# Patient Record
Sex: Female | Born: 1965 | Hispanic: Yes | Marital: Married | Smoking: Former smoker
Health system: Southern US, Community
[De-identification: ages and names within clinical notes are randomized; demographics above are authoritative.]

## PROBLEM LIST (undated history)

## (undated) DIAGNOSIS — K5790 Diverticulosis of intestine, part unspecified, without perforation or abscess without bleeding: Secondary | ICD-10-CM

## (undated) DIAGNOSIS — E669 Obesity, unspecified: Secondary | ICD-10-CM

## (undated) DIAGNOSIS — E119 Type 2 diabetes mellitus without complications: Secondary | ICD-10-CM

## (undated) DIAGNOSIS — I739 Peripheral vascular disease, unspecified: Secondary | ICD-10-CM

## (undated) DIAGNOSIS — G4730 Sleep apnea, unspecified: Secondary | ICD-10-CM

## (undated) DIAGNOSIS — E785 Hyperlipidemia, unspecified: Secondary | ICD-10-CM

## (undated) DIAGNOSIS — E039 Hypothyroidism, unspecified: Secondary | ICD-10-CM

## (undated) DIAGNOSIS — I10 Essential (primary) hypertension: Secondary | ICD-10-CM

## (undated) HISTORY — DX: Hyperlipidemia, unspecified: E78.5

## (undated) HISTORY — DX: Type 2 diabetes mellitus without complications: E11.9

## (undated) HISTORY — DX: Obesity, unspecified: E66.9

## (undated) HISTORY — PX: CHOLECYSTECTOMY: SHX55

## (undated) HISTORY — DX: Essential (primary) hypertension: I10

## (undated) HISTORY — PX: ECTOPIC PREGNANCY SURGERY: SHX613

## (undated) HISTORY — DX: Sleep apnea, unspecified: G47.30

## (undated) HISTORY — DX: Hypothyroidism, unspecified: E03.9

## (undated) HISTORY — DX: Peripheral vascular disease, unspecified: I73.9

## (undated) HISTORY — DX: Diverticulosis of intestine, part unspecified, without perforation or abscess without bleeding: K57.90

---

## 1986-10-22 HISTORY — PX: OVARIAN CYST SURGERY: SHX726

## 2013-10-22 HISTORY — PX: ESOPHAGOGASTRODUODENOSCOPY: SHX1529

## 2015-02-04 ENCOUNTER — Encounter: Payer: Self-pay | Admitting: Gastroenterology

## 2015-02-04 HISTORY — PX: COLONOSCOPY: SHX174

## 2015-06-02 HISTORY — PX: CHOLECYSTECTOMY, LAPAROSCOPIC: SHX56

## 2016-11-07 ENCOUNTER — Ambulatory Visit: Payer: Self-pay | Admitting: Medical

## 2016-11-09 ENCOUNTER — Ambulatory Visit (INDEPENDENT_AMBULATORY_CARE_PROVIDER_SITE_OTHER): Payer: BLUE CROSS/BLUE SHIELD | Admitting: Medical

## 2016-11-09 ENCOUNTER — Encounter: Payer: Self-pay | Admitting: Medical

## 2016-11-09 VITALS — BP 150/89 | HR 81 | Temp 98.0°F | Resp 16 | Ht 64.0 in | Wt 217.5 lb

## 2016-11-09 DIAGNOSIS — E039 Hypothyroidism, unspecified: Secondary | ICD-10-CM | POA: Diagnosis not present

## 2016-11-09 DIAGNOSIS — E119 Type 2 diabetes mellitus without complications: Secondary | ICD-10-CM

## 2016-11-09 DIAGNOSIS — I10 Essential (primary) hypertension: Secondary | ICD-10-CM | POA: Diagnosis not present

## 2016-11-09 DIAGNOSIS — R0683 Snoring: Secondary | ICD-10-CM | POA: Diagnosis not present

## 2016-11-09 MED ORDER — LEVOTHYROXINE SODIUM 25 MCG PO TABS: 25.0000 ug | ORAL_TABLET | Freq: Every day | ORAL | 3 refills | Status: DC

## 2016-11-09 MED ORDER — LOSARTAN POTASSIUM-HCTZ 50-12.5 MG PO TABS: 1.0000 | ORAL_TABLET | Freq: Every day | ORAL | 3 refills | Status: DC

## 2016-11-09 MED ORDER — METFORMIN HCL 1000 MG PO TABS: 1000.0000 mg | ORAL_TABLET | Freq: Two times a day (BID) | ORAL | 3 refills | Status: DC

## 2016-11-09 MED FILL — LEVOTHYROXINE 25 MCG TABLET: 25 | 30 days supply | Qty: 30 | Fill #0

## 2016-11-09 MED FILL — LOSARTAN-HCTZ 50-12.5 MG TA: 50-12.5 | 30 days supply | Qty: 30 | Fill #0

## 2016-11-09 MED FILL — metFORMIN HCL 1000 MG TABS: 1000 | 90 days supply | Qty: 180 | Fill #0

## 2016-11-09 NOTE — Progress Notes (Signed)
Subjective:    Patient ID: Harvest Dark, female    DOB: January 06, 1966, 51 y.o.   MRN: [id number]  HPI  I have reviewed pt PMH, PSH, FH, Social History and Surgical History  Pt is new pt.   Pt new to [place] from [place], pt has 2 children. Married.   htn- pt states she ran out of medication 3 weeks ago. Bp mild high today but no cardiac or neurologic signs or symptoms.  Pt has diabetes- Last a1c 6.4 was 3 months.  Pt last tsh was 2.2. Pt had nodules also but negative biopsy  in the past.    Review of Systems  Constitutional: Negative for chills, fatigue and fever.  HENT: Negative for congestion, ear pain, facial swelling, nosebleeds and postnasal drip.   Respiratory: Negative for cough, chest tightness, shortness of breath and wheezing.   Cardiovascular: Negative for chest pain and palpitations.  Gastrointestinal: Negative for abdominal pain.  Genitourinary: Negative for difficulty urinating, dysuria, flank pain, frequency, hematuria, urgency and vaginal discharge.  Musculoskeletal: Negative for back pain.  Skin: Negative for rash.  Neurological: Negative for dizziness, light-headedness and numbness.  Hematological: Negative for adenopathy. Does not bruise/bleed easily.  Psychiatric/Behavioral: Negative for behavioral problems, confusion, dysphoric mood, hallucinations and suicidal ideas. The patient is not nervous/anxious and is not hyperactive.     Past Medical History:  Diagnosis Date  . Diabetes mellitus ([place])   . Hypertension   . Hypothyroidism      Social History   Social History  . Marital status: Married    Spouse name: N/A  . Number of children: N/A  . Years of education: N/A   Occupational History  . Not on file.   Social History Main Topics  . Smoking status: Former Research scientist (life sciences)  . Smokeless tobacco: Never Used     Comment: Quit >20 years ago  . Alcohol use Not on file  . Drug use: Unknown  . Sexual activity: Not on file   Other Topics Concern  . Not  on file   Social History Narrative  . No narrative on file    Past Surgical History:  Procedure Laterality Date  . CHOLECYSTECTOMY, LAPAROSCOPIC  06/02/2015   [place]  . OVARIAN CYST SURGERY  1988   Left    No family history on file.  Allergies  Allergen Reactions  . Asa [Aspirin] Swelling and Rash    No current outpatient prescriptions on file prior to visit.   No current facility-administered medications on file prior to visit.     BP (!) 156/91 (BP Location: Right Arm, Patient Position: Sitting, Cuff Size: Large) Comment: Out of Medication x3 wks.  Pulse 81   Temp 98 F (36.7 C) (Oral)   Resp 16   Ht 5\' 4"  (1.626 m)   Wt 217 lb 8 oz (98.7 kg)   SpO2 100%   BMI 37.33 kg/m       Objective:   Physical Exam  General Mental Status- Alert. General Appearance- Not in acute distress.   Skin General: Color- Normal Color. Moisture- Normal Moisture.  Neck Carotid Arteries- Normal color. Moisture- Normal Moisture. No carotid bruits. No JVD.  Chest and Lung Exam Auscultation: Breath Sounds:-Normal.  Cardiovascular Auscultation:Rythm- Regular. Murmurs & Other Heart Sounds:Auscultation of the heart reveals- No Murmurs.  Abdomen Inspection:-Inspeection Normal. Palpation/Percussion:Note:No mass. Palpation and Percussion of the abdomen reveal- Non Tender, Non Distended + BS, no rebound or guarding.    Neurologic Cranial Nerve exam:- CN III-XII intact(No nystagmus),  symmetric smile. Strength:- 5/5 equal and symmetric strength both upper and lower extremities.      Assessment & Plan:  For your low thyroid, htn and diabetes I am refilling all your meds.  Will get cmp and a1c today. Will follow lab results and may make changes to meds.  For snoring will refer to specialist   Follow up date to be determined after lab review.  Gregroy Dombkowski, Percell Miller, PA-C

## 2016-11-09 NOTE — Patient Instructions (Addendum)
For your low thyroid, htn and diabetes I am refilling all your meds.  BP mild elevated but no meds for 3 weeks.  Will get cmp and a1c today. Will follow lab results and may make changes to meds.  For snoring will refer to specialist   Follow up date to be determined after lab review.

## 2016-11-09 NOTE — Progress Notes (Signed)
Pre visit review using our clinic review tool, if applicable. No additional management support is needed unless otherwise documented below in the visit note/SLS  

## 2016-11-12 ENCOUNTER — Other Ambulatory Visit (INDEPENDENT_AMBULATORY_CARE_PROVIDER_SITE_OTHER): Payer: BLUE CROSS/BLUE SHIELD

## 2016-11-12 DIAGNOSIS — E119 Type 2 diabetes mellitus without complications: Secondary | ICD-10-CM | POA: Diagnosis not present

## 2016-11-12 DIAGNOSIS — I10 Essential (primary) hypertension: Secondary | ICD-10-CM

## 2016-11-12 LAB — COMPLETE METABOLIC PANEL WITH GFR
ALBUMIN: 4.1 g/dL (ref 3.6–5.1)
ALT: 41 U/L — ABNORMAL HIGH (ref 6–29)
AST: 23 U/L (ref 10–35)
Alkaline Phosphatase: 73 U/L (ref 33–115)
BUN: 19 mg/dL (ref 7–25)
CHLORIDE: 104 mmol/L (ref 98–110)
CO2: 25 mmol/L (ref 20–31)
Calcium: 9.5 mg/dL (ref 8.6–10.2)
Creat: 0.82 mg/dL (ref 0.50–1.10)
GFR, Est African American: >89 mL/min (ref >=60)
GFR, Est Non African American: 84 mL/min (ref >=60)
GLUCOSE: 129 mg/dL — AB (ref 65–99)
POTASSIUM: 3.9 mmol/L (ref 3.5–5.3)
SODIUM: 137 mmol/L (ref 135–146)
Total Bilirubin: 0.6 mg/dL (ref 0.2–1.2)
Total Protein: 6.9 g/dL (ref 6.1–8.1)

## 2016-11-12 LAB — HEMOGLOBIN A1C: Hgb A1c MFr Bld: 6.9 % — ABNORMAL HIGH (ref 4.6–6.5)

## 2016-11-13 NOTE — Progress Notes (Signed)
Pt was already informed

## 2016-12-04 ENCOUNTER — Ambulatory Visit (INDEPENDENT_AMBULATORY_CARE_PROVIDER_SITE_OTHER): Payer: BLUE CROSS/BLUE SHIELD | Admitting: Medical

## 2016-12-04 ENCOUNTER — Encounter: Payer: Self-pay | Admitting: Medical

## 2016-12-04 VITALS — BP 130/80 | HR 78 | Temp 98.0°F | Resp 16 | Ht 64.0 in | Wt 215.1 lb

## 2016-12-04 DIAGNOSIS — E119 Type 2 diabetes mellitus without complications: Secondary | ICD-10-CM

## 2016-12-04 DIAGNOSIS — E039 Hypothyroidism, unspecified: Secondary | ICD-10-CM

## 2016-12-04 DIAGNOSIS — I10 Essential (primary) hypertension: Secondary | ICD-10-CM | POA: Diagnosis not present

## 2016-12-04 DIAGNOSIS — L853 Xerosis cutis: Secondary | ICD-10-CM

## 2016-12-04 MED ORDER — AMMONIUM LACTATE 12 % EX LOTN: 1.0000 "application " | TOPICAL_LOTION | CUTANEOUS | 1 refills | Status: DC | PRN

## 2016-12-04 MED FILL — AMMONIUM LACTATE 12% LOTION: 12 | 30 days supply | Qty: 226 | Fill #0

## 2016-12-04 NOTE — Progress Notes (Signed)
Subjective:    Patient ID: Harvest Dark, female    DOB: 1966/06/29, 51 y.o.   MRN: [id number]  HPI  Pt in for bp check. Pt initially high in office. Pt has been checking at her house about one time a day. Her bp is about 130/80. Pt is on hyzaar. No cardiac or neurologic signs or symptoms. When she first cam in her bp was higher but not on medications.  Pt a1-c was less than 7 on recent a1c. She is on metformin. She plans to walk more when weather warms.  Pt does have history of thyroid nodules. Had [place] in October. Study was negative. Told to repeat every. Pt tsh was normal. Pt has been on low dose synthroid for a very low dose for 2-3 yrs.    Review of Systems  Constitutional: Negative for chills, fatigue and fever.  Respiratory: Negative for chest tightness, shortness of breath and wheezing.   Cardiovascular: Negative for chest pain and palpitations.  Gastrointestinal: Negative for abdominal pain, diarrhea, nausea and vomiting.  Musculoskeletal: Negative for back pain and neck pain.  Skin: Negative for rash.  Neurological: Negative for dizziness, syncope, weakness, numbness and headaches.  Hematological: Negative for adenopathy. Does not bruise/bleed easily.  Psychiatric/Behavioral: Negative for agitation, confusion, decreased concentration, dysphoric mood and suicidal ideas. The patient is not nervous/anxious.     Past Medical History:  Diagnosis Date  . Diabetes mellitus ([place])   . Hypertension   . Hypothyroidism      Social History   Social History  . Marital status: Married    Spouse name: N/A  . Number of children: N/A  . Years of education: N/A   Occupational History  . Not on file.   Social History Main Topics  . Smoking status: Former Smoker    Quit date: 11/09/1989  . Smokeless tobacco: Never Used     Comment: Quit >20 years ago  . Alcohol use No  . Drug use: No  . Sexual activity: Yes   Other Topics Concern  . Not on file   Social History  Narrative  . No narrative on file    Past Surgical History:  Procedure Laterality Date  . CHOLECYSTECTOMY, LAPAROSCOPIC  06/02/2015   [place]  . OVARIAN CYST SURGERY  1988   Left    No family history on file.  Allergies  Allergen Reactions  . Asa [Aspirin] Swelling and Rash    Current Outpatient Prescriptions on File Prior to Visit  Medication Sig Dispense Refill  . levothyroxine (SYNTHROID, LEVOTHROID) 25 MCG tablet Take 1 tablet (25 mcg total) by mouth daily before breakfast. 30 tablet 3  . losartan-hydrochlorothiazide (HYZAAR) 50-12.5 MG tablet Take 1 tablet by mouth daily. 30 tablet 3  . metFORMIN (GLUCOPHAGE) 1000 MG tablet Take 1 tablet (1,000 mg total) by mouth 2 (two) times daily with a meal. 180 tablet 3   No current facility-administered medications on file prior to visit.     BP (!) 140/95 (BP Location: Left Arm, Cuff Size: Large)   Pulse 78   Temp 98 F (36.7 C) (Oral)   Resp 16   Ht 5\' 4"  (1.626 m)   Wt 215 lb 2 oz (97.6 kg)   SpO2 99%   BMI 36.93 kg/m       Objective:   Physical Exam  General Mental Status- Alert. General Appearance- Not in acute distress.   Skin General: Color- Normal Color. Moisture- Normal Moisture.  Neck Carotid Arteries- Normal color.  Moisture- Normal Moisture. No carotid bruits. No JVD.  Chest and Lung Exam Auscultation: Breath Sounds:-Normal.  Cardiovascular Auscultation:Rythm- Regular. Murmurs & Other Heart Sounds:Auscultation of the heart reveals- No Murmurs.  Abdomen Inspection:-Inspeection Normal. Palpation/Percussion:Note:No mass. Palpation and Percussion of the abdomen reveal- Non Tender, Non Distended + BS, no rebound or guarding.   Neurologic Cranial Nerve exam:- CN III-XII intact(No nystagmus), symmetric smile. Strength:- 5/5 equal and symmetric strength both upper and lower extremities.  Skin- lower calf dry skin both sides.    Assessment & Plan:  For your high blood pressure well controlled  today continue Hyzaar.  For diabetes adequately controlled  continue metformin. Exercise more.  For low thyoid will repeat tsh in 3 months.   For dry skin rx lac-hydrin.   Follow up in 3 months early am fasting.  Joshuah Minella, Percell Miller, PA-C

## 2016-12-04 NOTE — Patient Instructions (Addendum)
For your high blood pressure well controlled today continue Hyzaar.  For diabetes adequately controlled  continue metformin. Exercise more.  For low thyoid will repeat tsh in 3 months.   For dry skin rx lac-hydrin.   Follow up in 3 months early am fasting.

## 2016-12-04 NOTE — Progress Notes (Signed)
Pre visit review using our clinic review tool, if applicable. No additional management support is needed unless otherwise documented below in the visit note/SLS  

## 2016-12-10 MED FILL — LOSARTAN-HCTZ 50-12.5 MG TA: 50-12.5 | 30 days supply | Qty: 30 | Fill #1

## 2016-12-10 MED FILL — LEVOTHYROXINE 25 MCG TABLET: 25 | 30 days supply | Qty: 30 | Fill #1

## 2017-01-02 ENCOUNTER — Ambulatory Visit (INDEPENDENT_AMBULATORY_CARE_PROVIDER_SITE_OTHER): Payer: BLUE CROSS/BLUE SHIELD | Admitting: Pulmonary Disease

## 2017-01-02 ENCOUNTER — Encounter: Payer: Self-pay | Admitting: Pulmonary Disease

## 2017-01-02 VITALS — BP 126/98 | HR 86 | Ht 64.0 in | Wt 217.8 lb

## 2017-01-02 DIAGNOSIS — G4710 Hypersomnia, unspecified: Secondary | ICD-10-CM

## 2017-01-02 NOTE — Patient Instructions (Signed)
It was a pleasure taking care of you today!  We will schedule you to have a sleep study to determine if you have sleep apnea.     We will get a lab sleep study.  You will be scheduled to have a lab sleep study in 4-6 weeks.  Someone from the sleep lab will call you in 2-3 days to schedule the study with you.  They usually have cancellations every night so most likely, they will have openings for a lab sleep study next week or so.  We encourage you to do your sleep study then if possible. Please give us a call in a week is no one from the sleep lab calls you in 2-3 days.   If the sleep study is positive, we will order you a CPAP  machine.  Please call the office if you do NOT receive your machine in the next 1-2 weeks.   Please make sure you use your CPAP device everytime you sleep.  We will monitor the usage of your machine per your insurance requirement.  Your insurance company may take the machine from you if you are not using it regularly.   Please clean the mask, tubings, filter, water reservoir with soapy water every week.  Please use distilled water for the water reservoir.   Please call the office or your machine provider (DME company) if you are having issues with the device.   Return to clinic in 8-10 weeks with Dr. De Dios or NP    

## 2017-01-02 NOTE — Assessment & Plan Note (Signed)
  Patient has snoring, witnessed apneas, gasping, choking, frequent awakenings.  She sleeps 6 hrs/night.  Naps daily. No abnormal behavior in sleep.   Her husband has OSA and uses cpap.  ESS 17.   Plan : We discussed about the diagnosis of Obstructive Sleep Apnea (OSA) and implications of untreated OSA. We discussed about CPAP and BiPaP as possible treatment options.    We will schedule the patient for a sleep study. Plan for a split-night sleep study. She is from [place]. Her husband has sleep apnea. Most likely has moderate. Anticipate no issues with CPAP. Likely will need a full face mask.  Patient was instructed to call the office if he/she has not heard back from the office 1-2 weeks after the sleep study.   Patient was instructed to call the office if he/she is having issues with the PAP device.   We discussed good sleep hygiene.   Patient was advised not to engage in activities requiring concentration and/or vigilance if he/she is sleepy.  Patient was advised not to drive if he/she is sleepy.

## 2017-01-02 NOTE — Progress Notes (Signed)
Subjective:    Patient ID: Deborah Henderson, female    DOB: 04-15-1966, 51 y.o.   MRN: [id number]  HPI  This is the case of Deborah Henderson, 51 y.o. Female, who was referred by Hattie Perch in consultation regarding possible OSA   As you very well know, patient is a non smoker, not known to have asthma or copd, is from [place] but has been in [place] since 08/2016.   Patient has snoring, witnessed apneas, gasping, choking, frequent awakenings.  She sleeps 6 hrs/night.  Naps daily. No abnormal behavior in sleep.   Her husband has OSA and uses cpap.  ESS 17.      Review of Systems  Constitutional: Negative.  Negative for fever and unexpected weight change.  HENT: Positive for congestion. Negative for dental problem, ear pain, nosebleeds, postnasal drip, rhinorrhea, sinus pressure, sneezing, sore throat and trouble swallowing.   Eyes: Negative.  Negative for redness and itching.  Respiratory: Positive for cough. Negative for chest tightness, shortness of breath and wheezing.   Cardiovascular: Positive for leg swelling. Negative for palpitations.  Gastrointestinal: Negative.  Negative for nausea and vomiting.  Endocrine: Negative.   Genitourinary: Negative.  Negative for dysuria.  Musculoskeletal: Negative.  Negative for joint swelling.  Skin: Negative.  Negative for rash.  Allergic/Immunologic: Negative.  Negative for environmental allergies, food allergies and immunocompromised state.  Neurological: Positive for dizziness and headaches.  Hematological: Negative.  Does not bruise/bleed easily.  Psychiatric/Behavioral: Negative.  Negative for dysphoric mood. The patient is not nervous/anxious.    Past Medical History:  Diagnosis Date  . Diabetes mellitus ([place])   . Hypertension   . Hypothyroidism    (-)CA, DVT  No family history on file.  Father had colon CA.  Mother had no significant issues.   Past Surgical History:  Procedure Laterality Date  .  CHOLECYSTECTOMY, LAPAROSCOPIC  06/02/2015   [place]  . OVARIAN CYST SURGERY  1988   Left    Social History   Social History  . Marital status: Married    Spouse name: N/A  . Number of children: N/A  . Years of education: N/A   Occupational History  . Not on file.   Social History Main Topics  . Smoking status: Former Smoker    Quit date: 11/09/1989  . Smokeless tobacco: Never Used     Comment: Quit >20 years ago  . Alcohol use No  . Drug use: No  . Sexual activity: Yes   Other Topics Concern  . Not on file   Social History Narrative  . No narrative on file    Married with 2 children. Worked in [employer].   Allergies  Allergen Reactions  . Asa [Aspirin] Swelling and Rash     Outpatient Medications Prior to Visit  Medication Sig Dispense Refill  . ammonium lactate (AMLACTIN) 12 % lotion Apply 1 application topically as needed for dry skin. Apply to area twice daily 225 g 1  . levothyroxine (SYNTHROID, LEVOTHROID) 25 MCG tablet Take 1 tablet (25 mcg total) by mouth daily before breakfast. 30 tablet 3  . losartan-hydrochlorothiazide (HYZAAR) 50-12.5 MG tablet Take 1 tablet by mouth daily. 30 tablet 3  . metFORMIN (GLUCOPHAGE) 1000 MG tablet Take 1 tablet (1,000 mg total) by mouth 2 (two) times daily with a meal. 180 tablet 3   No facility-administered medications prior to visit.    No orders of the defined types were placed in this encounter.  Objective:   Physical Exam  Vitals:  Vitals:   01/02/17 0859  BP: (!) 126/98  Pulse: 86  SpO2: 97%  Weight: 217 lb 12.8 oz (98.8 kg)  Height: 5\' 4"  (1.626 m)    Constitutional/General:  Pleasant, well-nourished, well-developed, not in any distress,  Comfortably seating.  Well kempt  Body mass index is 37.39 kg/m. Wt Readings from Last 3 Encounters:  01/02/17 217 lb 12.8 oz (98.8 kg)  12/04/16 215 lb 2 oz (97.6 kg)  11/09/16 217 lb 8 oz (98.7 kg)     HEENT: Pupils equal and reactive to light and  accommodation. Anicteric sclerae. Normal nasal mucosa.   No oral  lesions,  mouth clear,  oropharynx clear, no postnasal drip. (-) Oral thrush. No dental caries.  Airway - Mallampati class III  Neck: No masses. Midline trachea. No JVD, (-) LAD. (-) bruits appreciated.  Respiratory/Chest: Grossly normal chest. (-) deformity. (-) Accessory muscle use.  Symmetric expansion. (-) Tenderness on palpation.  Resonant on percussion.  Diminished BS on both lower lung zones. (-) wheezing, crackles, rhonchi (-) egophony  Cardiovascular: Regular rate and  rhythm, heart sounds normal, no murmur or gallops, no peripheral edema  Gastrointestinal:  Normal bowel sounds. Soft, non-tender. No hepatosplenomegaly.  (-) masses.   Musculoskeletal:  Normal muscle tone. Normal gait.   Extremities: Grossly normal. (-) clubbing, cyanosis.  (-) edema  Skin: (-) rash,lesions seen.   Neurological/Psychiatric : alert, oriented to time, place, person. Normal mood and affect         Assessment & Plan:  Hypersomnia  Patient has snoring, witnessed apneas, gasping, choking, frequent awakenings.  She sleeps 6 hrs/night.  Naps daily. No abnormal behavior in sleep.   Her husband has OSA and uses cpap.  ESS 17.   Plan : We discussed about the diagnosis of Obstructive Sleep Apnea (OSA) and implications of untreated OSA. We discussed about CPAP and BiPaP as possible treatment options.    We will schedule the patient for a sleep study. Plan for a split-night sleep study. She is from [place]. Her husband has sleep apnea. Most likely has moderate. Anticipate no issues with CPAP. Likely will need a full face mask.  Patient was instructed to call the office if he/she has not heard back from the office 1-2 weeks after the sleep study.   Patient was instructed to call the office if he/she is having issues with the PAP device.   We discussed good sleep hygiene.   Patient was advised not to engage in activities  requiring concentration and/or vigilance if he/she is sleepy.  Patient was advised not to drive if he/she is sleepy.      Thank you very much for letting me participate in this patient's care. Please do not hesitate to give me a call if you have any questions or concerns regarding the treatment plan.   Patient will follow up with me in 8-10 weeks.    Monica Becton, MD 01/02/2017   9:25 AM [facility] Pager: [phone] Office: [phone], Fax: [phone]

## 2017-01-09 MED FILL — LOSARTAN-HCTZ 50-12.5 MG TA: 50-12.5 | 30 days supply | Qty: 30 | Fill #2

## 2017-01-09 MED FILL — LEVOTHYROXINE 25 MCG TABLET: 25 | 30 days supply | Qty: 30 | Fill #2

## 2017-02-08 ENCOUNTER — Telehealth: Payer: Self-pay | Admitting: Pulmonary Disease

## 2017-02-08 ENCOUNTER — Other Ambulatory Visit: Payer: Self-pay

## 2017-02-08 DIAGNOSIS — G4710 Hypersomnia, unspecified: Secondary | ICD-10-CM

## 2017-02-08 MED FILL — LEVOTHYROXINE 25 MCG TABLET: 25 | 30 days supply | Qty: 30 | Fill #3

## 2017-02-08 MED FILL — LOSARTAN-HCTZ 50-12.5 MG TA: 50-12.5 | 30 days supply | Qty: 30 | Fill #3

## 2017-02-08 MED FILL — metFORMIN HCL 1000 MG TABS: 1000 | 90 days supply | Qty: 180 | Fill #1

## 2017-02-08 NOTE — Telephone Encounter (Signed)
I will check with [facility] and get a precert Joellen [place]

## 2017-02-08 NOTE — Telephone Encounter (Signed)
   Libby/Sherry :   I ordered a split night sleep study on this pt which was denied.   I have NOT done a peer to peer yet.  I was wondering if we can do a HST first.  Can we order a HST rather than a split night sleep study? Will that be approved?    Thanks!  J. Shirl Harris, MD 02/08/2017, 1:10 PM [facility] Pager [phone] After 3 pm or if no answer, call [phone]

## 2017-02-15 NOTE — Telephone Encounter (Signed)
[facility] has precerted hst & I have pt scheduled to pick up machine on 5/9.  I have spoken to pt & her husband.  Nothing further needed.

## 2017-02-17 ENCOUNTER — Encounter (HOSPITAL_BASED_OUTPATIENT_CLINIC_OR_DEPARTMENT_OTHER): Payer: BLUE CROSS/BLUE SHIELD

## 2017-02-27 ENCOUNTER — Telehealth: Payer: Self-pay | Admitting: Pulmonary Disease

## 2017-02-27 DIAGNOSIS — R1900 Intra-abdominal and pelvic swelling, mass and lump, unspecified site: Secondary | ICD-10-CM

## 2017-02-27 DIAGNOSIS — G4733 Obstructive sleep apnea (adult) (pediatric): Secondary | ICD-10-CM | POA: Diagnosis not present

## 2017-02-27 NOTE — Telephone Encounter (Signed)
Ok. Noted.   Monica Becton, MD 02/27/2017, 4:34 PM [facility] Pager [phone] After 3 pm or if no answer, call [phone]

## 2017-02-27 NOTE — Telephone Encounter (Signed)
AD  FYI  Pt came into the office today to pick up her machine to do her HST. I canceled her f/u appt that she was suppose to have with you next week. Pt is ok with this just in case her sleep study shows she has osa and needs to start cpap or bipap then her appt would need to be moved any ways. This pt was the one who her insurance would not cover the in lab so she needed a hst

## 2017-03-04 ENCOUNTER — Ambulatory Visit (HOSPITAL_BASED_OUTPATIENT_CLINIC_OR_DEPARTMENT_OTHER)
Admission: RE | Admit: 2017-03-04 | Discharge: 2017-03-04 | Disposition: A | Discharge status: Home / Self Care | Payer: BLUE CROSS/BLUE SHIELD | Source: Ambulatory Visit | Attending: Medical | Admitting: Medical

## 2017-03-04 ENCOUNTER — Ambulatory Visit (INDEPENDENT_AMBULATORY_CARE_PROVIDER_SITE_OTHER): Payer: BLUE CROSS/BLUE SHIELD | Admitting: Medical

## 2017-03-04 VITALS — BP 123/80 | HR 82 | Temp 97.6°F | Resp 16 | Ht 66.0 in | Wt 216.8 lb

## 2017-03-04 DIAGNOSIS — F329 Major depressive disorder, single episode, unspecified: Secondary | ICD-10-CM | POA: Diagnosis not present

## 2017-03-04 DIAGNOSIS — Z23 Encounter for immunization: Secondary | ICD-10-CM | POA: Diagnosis not present

## 2017-03-04 DIAGNOSIS — R1900 Intra-abdominal and pelvic swelling, mass and lump, unspecified site: Secondary | ICD-10-CM | POA: Insufficient documentation

## 2017-03-04 DIAGNOSIS — K760 Fatty (change of) liver, not elsewhere classified: Secondary | ICD-10-CM | POA: Diagnosis not present

## 2017-03-04 DIAGNOSIS — I10 Essential (primary) hypertension: Secondary | ICD-10-CM | POA: Diagnosis not present

## 2017-03-04 DIAGNOSIS — F32A Depression, unspecified: Secondary | ICD-10-CM

## 2017-03-04 DIAGNOSIS — R1011 Right upper quadrant pain: Secondary | ICD-10-CM | POA: Insufficient documentation

## 2017-03-04 DIAGNOSIS — E039 Hypothyroidism, unspecified: Secondary | ICD-10-CM | POA: Diagnosis not present

## 2017-03-04 DIAGNOSIS — E119 Type 2 diabetes mellitus without complications: Secondary | ICD-10-CM

## 2017-03-04 LAB — HEMOGLOBIN A1C: Hgb A1c MFr Bld: 8.2 % — ABNORMAL HIGH (ref 4.6–6.5)

## 2017-03-04 LAB — LIPID PANEL
Cholesterol: 152 mg/dL (ref 0–200)
HDL: 35.5 mg/dL — AB (ref >39.00)
LDL CALC: 87 mg/dL (ref 0–99)
NONHDL: 116.93
Total CHOL/HDL Ratio: 4
Triglycerides: 149 mg/dL (ref 0.0–149.0)
VLDL: 29.8 mg/dL (ref 0.0–40.0)

## 2017-03-04 LAB — COMPREHENSIVE METABOLIC PANEL
ALBUMIN: 4.4 g/dL (ref 3.5–5.2)
ALK PHOS: 72 U/L (ref 39–117)
ALT: 82 U/L — ABNORMAL HIGH (ref 0–35)
AST: 64 U/L — AB (ref 0–37)
BUN: 10 mg/dL (ref 6–23)
CALCIUM: 9.7 mg/dL (ref 8.4–10.5)
CHLORIDE: 101 meq/L (ref 96–112)
CO2: 26 mEq/L (ref 19–32)
CREATININE: 0.79 mg/dL (ref 0.40–1.20)
GFR: 81.81 mL/min (ref >60.00)
Glucose, Bld: 144 mg/dL — ABNORMAL HIGH (ref 70–99)
POTASSIUM: 3.4 meq/L — AB (ref 3.5–5.1)
SODIUM: 136 meq/L (ref 135–145)
TOTAL PROTEIN: 7.3 g/dL (ref 6.0–8.3)
Total Bilirubin: 0.7 mg/dL (ref 0.2–1.2)

## 2017-03-04 LAB — TSH: TSH: 3.29 u[IU]/mL (ref 0.35–4.50)

## 2017-03-04 LAB — T4, FREE: Free T4: 0.75 ng/dL (ref 0.60–1.60)

## 2017-03-04 MED ORDER — LOSARTAN POTASSIUM-HCTZ 50-12.5 MG PO TABS: 1.0000 | ORAL_TABLET | Freq: Every day | ORAL | 3 refills | Status: DC

## 2017-03-04 MED ORDER — LEVOTHYROXINE SODIUM 25 MCG PO TABS
25.0000 ug | ORAL_TABLET | Freq: Every day | ORAL | 3 refills | Status: DC
Start: 2017-03-04 — End: 2017-07-17

## 2017-03-04 MED ORDER — SERTRALINE HCL 25 MG PO TABS: 25.0000 mg | ORAL_TABLET | Freq: Every day | ORAL | 2 refills | Status: DC

## 2017-03-04 MED ORDER — METFORMIN HCL 1000 MG PO TABS: 1000.0000 mg | ORAL_TABLET | Freq: Two times a day (BID) | ORAL | 3 refills | Status: DC

## 2017-03-04 MED ORDER — CANAGLIFLOZIN 100 MG PO TABS: 100.0000 mg | ORAL_TABLET | Freq: Every day | ORAL | 2 refills | Status: DC

## 2017-03-04 MED FILL — SERTRALINE HCL 25 MG TABLET: 25 | 30 days supply | Qty: 30 | Fill #0

## 2017-03-04 MED FILL — LOSARTAN-HCTZ 50-12.5 MG TA: 50-12.5 | 30 days supply | Qty: 30 | Fill #0

## 2017-03-04 MED FILL — LEVOTHYROXINE 25 MCG TABLET: 25 | 30 days supply | Qty: 30 | Fill #0

## 2017-03-04 NOTE — Progress Notes (Signed)
Subjective:    Patient ID: Deborah Henderson, female    DOB: 10-16-1966, 51 y.o.   MRN: [id number]  HPI   Pt in states overall she feels well.   But does report has 2 months of on and off low level pain that burns on occasion. Pt pain is very low level. Pt states when has pain will be brief transient for 10-15 minutes. She rubs her stomach and pain is decreased. Pain not directly related to eating. Pt had her gallbladder removed in 2016. Pt had gallbladder moved in [place]. No skin rash. Pt had mild  alt elevation in the past.  Pt blood pressure is well controlled today. No cardiac or neurologic signs or symptoms.  Pt a1c 3 months ago was 6.9.  Pt has low thyroid history and will get labs today.  She is fasting and wants screening labs.  Pt needs tdap.  Pt is walking daily but admits that she is having trouble with low sugar diet. Pt walking 1 mile 3 days a week.     Review of Systems  Constitutional: Negative for chills.  HENT: Negative for congestion, ear pain, facial swelling, postnasal drip, sinus pain and sinus pressure.   Respiratory: Negative for cough, chest tightness, shortness of breath and wheezing.   Cardiovascular: Negative for chest pain and palpitations.  Gastrointestinal: Positive for abdominal pain. Negative for abdominal distention, anal bleeding, blood in stool, constipation, diarrhea, nausea, rectal pain and vomiting.       See hpi.  Endocrine: Negative for polydipsia, polyphagia and polyuria.  Genitourinary: Negative for dysuria.  Musculoskeletal: Negative for arthralgias and back pain.  Skin: Negative for rash.  Neurological: Negative for dizziness, syncope, speech difficulty, weakness, light-headedness, numbness and headaches.  Hematological: Negative for adenopathy. Does not bruise/bleed easily.  Psychiatric/Behavioral: Positive for dysphoric mood. Negative for sleep disturbance and suicidal ideas. The patient is nervous/anxious.        Pt  stressed and cries at time but not today.. A lot of worries since making move from [place]. A lot of family still in [place].   Pt also mentioned possible anxiety attack.     Past Medical History:  Diagnosis Date  . Diabetes mellitus ([employer])   . Hypertension   . Hypothyroidism      Social History   Social History  . Marital status: Married    Spouse name: N/A  . Number of children: N/A  . Years of education: N/A   Occupational History  . Not on file.   Social History Main Topics  . Smoking status: Former Smoker    Quit date: 11/09/1989  . Smokeless tobacco: Never Used     Comment: Quit >20 years ago  . Alcohol use No  . Drug use: No  . Sexual activity: Yes   Other Topics Concern  . Not on file   Social History Narrative  . No narrative on file    Past Surgical History:  Procedure Laterality Date  . CHOLECYSTECTOMY, LAPAROSCOPIC  06/02/2015   [place]  . OVARIAN CYST SURGERY  1988   Left    No family history on file.  Allergies  Allergen Reactions  . Asa [Aspirin] Swelling and Rash    Current Outpatient Prescriptions on File Prior to Visit  Medication Sig Dispense Refill  . ammonium lactate (AMLACTIN) 12 % lotion Apply 1 application topically as needed for dry skin. Apply to area twice daily 225 g 1   No current facility-administered medications on  file prior to visit.     BP 123/80 (BP Location: Right Arm, Patient Position: Sitting, Cuff Size: Normal)   Pulse 82   Temp 97.6 F (36.4 C) (Oral)   Resp 16   Ht 5\' 6"  (1.676 m)   Wt 216 lb 12.8 oz (98.3 kg)   SpO2 96%   BMI 34.99 kg/m       Objective:   Physical Exam  General Mental Status- Alert. General Appearance- Not in acute distress.   Skin General: Color- Normal Color. Moisture- Normal Moisture.  Neck Carotid Arteries- Normal color. Moisture- Normal Moisture. No carotid bruits. No JVD.  Chest and Lung Exam Auscultation: Breath  Sounds:-Normal.  Cardiovascular Auscultation:Rythm- Regular. Murmurs & Other Heart Sounds:Auscultation of the heart reveals- No Murmurs.  Abdomen Inspection:-Inspeection Normal. Palpation/Percussion:Note:No mass. Palpation and Percussion of the abdomen reveal- Non Tender, Non Distended + BS, no rebound or guarding.   Neurologic Cranial Nerve exam:- CN III-XII intact(No nystagmus), symmetric smile. Strength:- 5/5 equal and symmetric strength both upper and lower extremities.  Feet- see quality metrics      Assessment & Plan:  Your blood pressure is well controlled today. Continue your current blood pressure medication.  For your diabetes continue metformin but may adjust doses of medications based on your lab results.  We are checking your lipid panel today today and checking your thyroid function test as well.  For your mild transient abd pain I placed order to get [place] of abdomen. Please go down stairs today to get that scheduled. Will see if you have fatty liver or other condition.  We gave you tdap today.  Follow up in 3 months or as needed(possibly sooner if labs abnormal) Or if your abdomen pain features change.  End discussed depression and she agreed to use sertraline.(see ros)

## 2017-03-04 NOTE — Patient Instructions (Addendum)
Your blood pressure is well controlled today. Continue your current blood pressure medication.  For your diabetes continue metformin but may adjust doses of medications based on your lab results.  We are checking your lipid panel today today and checking your thyroid function test as well.  For your mild transient abd pain I placed order to get [place] of abdomen. Please go down stairs today to get that scheduled. Will see if you have fatty liver or other condition.  We gave you tdap today.  Follow up in 3 months or as needed(possibly sooner if labs abnormal) Or if your abdomen pain features change.  End of visit  discussed depression and she agreed to use sertraline.(see ros)

## 2017-03-04 NOTE — Telephone Encounter (Signed)
rx invokana sent to pt pharmacy.

## 2017-03-04 NOTE — Telephone Encounter (Signed)
Will you help coordinate pelvic US appointmen/scheulingt as well.

## 2017-03-05 ENCOUNTER — Other Ambulatory Visit: Payer: Self-pay | Admitting: Medical

## 2017-03-05 ENCOUNTER — Ambulatory Visit (HOSPITAL_BASED_OUTPATIENT_CLINIC_OR_DEPARTMENT_OTHER)
Admission: RE | Admit: 2017-03-05 | Discharge: 2017-03-05 | Disposition: A | Discharge status: Home / Self Care | Payer: BLUE CROSS/BLUE SHIELD | Source: Ambulatory Visit | Attending: Medical | Admitting: Medical

## 2017-03-05 ENCOUNTER — Ambulatory Visit (HOSPITAL_BASED_OUTPATIENT_CLINIC_OR_DEPARTMENT_OTHER): Payer: BLUE CROSS/BLUE SHIELD

## 2017-03-05 DIAGNOSIS — R1900 Intra-abdominal and pelvic swelling, mass and lump, unspecified site: Secondary | ICD-10-CM | POA: Diagnosis present

## 2017-03-05 DIAGNOSIS — N9489 Other specified conditions associated with female genital organs and menstrual cycle: Secondary | ICD-10-CM | POA: Insufficient documentation

## 2017-03-05 MED FILL — INVOKANA 100 MG TABLET: 100 | 30 days supply | Qty: 30 | Fill #0

## 2017-03-05 NOTE — Telephone Encounter (Signed)
Orders sent to Imaging, awaiting appt

## 2017-03-06 ENCOUNTER — Telehealth: Payer: Self-pay

## 2017-03-06 NOTE — Telephone Encounter (Signed)
Deborah Henderson from [facility] called want to be sure PCP takes a look at pt's pelvic US report.

## 2017-03-07 ENCOUNTER — Telehealth: Payer: Self-pay | Admitting: Medical

## 2017-03-07 ENCOUNTER — Ambulatory Visit: Payer: BLUE CROSS/BLUE SHIELD | Admitting: Pulmonary Disease

## 2017-03-07 NOTE — Telephone Encounter (Signed)
Pt was called and informed the below, pt states already has schedule her appt with OBGYN on 03-14-2017 at 9:00 am at Dr Toney Rakes office but with Provider Dellis Filbert, MARIE-LYNE. Pt states is leaving to [place] in April 08, 2017 but will change the trip if OBGYN tells her to do so.

## 2017-03-07 NOTE — Telephone Encounter (Signed)
-----   Message from Mackie Pai, PA-C sent at 03/06/2017 10:38 PM EDT ----- Pt has a cystic mass left side of pelvis with possible solid component. I want to get he in with GYN. I have asked referral staff to try to get her in with Dr. Toney Rakes spanish speaking gyn. I would like her to be seen  relatively quickly. Will you ask her when she is gong to [place]. Will you see if Anderson Malta can get her in with gyn before then. So at least we have plan in place. I understand she will be in [place] for more than a month. Ask Anderson Malta to give me update on referral date/appointment.

## 2017-03-08 ENCOUNTER — Telehealth: Payer: Self-pay | Admitting: Pulmonary Disease

## 2017-03-08 DIAGNOSIS — G4733 Obstructive sleep apnea (adult) (pediatric): Secondary | ICD-10-CM

## 2017-03-08 NOTE — Telephone Encounter (Signed)
  Please call the pt and tell the pt the [place]  showed OSA  Pt stops breathing 15   times an hour.   Home sleep study was done on : 02/27/17  Please order autoCPAP 5-15 cm H2O. Patient will need a mask fitting session. Patient will need a 1 month download.   Patient needs to be seen by me or any of the NPs/APPs  4-6 weeks after obtaining the cpap machine. Let me know if you receive this.   Thanks!   J. Shirl Harris, MD 03/08/2017, 1:47 PM

## 2017-03-11 ENCOUNTER — Other Ambulatory Visit: Payer: Self-pay | Admitting: *Deleted

## 2017-03-11 DIAGNOSIS — G4710 Hypersomnia, unspecified: Secondary | ICD-10-CM

## 2017-03-12 NOTE — Telephone Encounter (Signed)
Spoke with pt's spouse due to her not being able to speak [place]. He translated for the pt and they agreed to the order being placed for the cpap. The order was placed as well as we went ahead and scheduled a follow up ov. They had no further questions. Nothing further is needed

## 2017-03-14 ENCOUNTER — Encounter: Payer: Self-pay | Admitting: Obstetrics & Gynecology

## 2017-03-14 ENCOUNTER — Ambulatory Visit (INDEPENDENT_AMBULATORY_CARE_PROVIDER_SITE_OTHER): Payer: BLUE CROSS/BLUE SHIELD | Admitting: Obstetrics & Gynecology

## 2017-03-14 ENCOUNTER — Telehealth: Payer: Self-pay | Admitting: *Deleted

## 2017-03-14 VITALS — BP 122/78 | Ht 63.5 in | Wt 217.0 lb

## 2017-03-14 DIAGNOSIS — R350 Frequency of micturition: Secondary | ICD-10-CM

## 2017-03-14 DIAGNOSIS — R1900 Intra-abdominal and pelvic swelling, mass and lump, unspecified site: Secondary | ICD-10-CM

## 2017-03-14 DIAGNOSIS — B3731 Acute candidiasis of vulva and vagina: Secondary | ICD-10-CM

## 2017-03-14 DIAGNOSIS — B373 Candidiasis of vulva and vagina: Secondary | ICD-10-CM

## 2017-03-14 DIAGNOSIS — N838 Other noninflammatory disorders of ovary, fallopian tube and broad ligament: Secondary | ICD-10-CM

## 2017-03-14 LAB — URINALYSIS W MICROSCOPIC + REFLEX CULTURE
BACTERIA UA: NONE SEEN [HPF]
BILIRUBIN URINE: NEGATIVE
CRYSTALS: NONE SEEN [HPF]
Casts: NONE SEEN [LPF]
HGB URINE DIPSTICK: NEGATIVE
Ketones, ur: NEGATIVE
Leukocytes, UA: NEGATIVE
Nitrite: NEGATIVE
RBC / HPF: NONE SEEN RBC/HPF (ref <=2)
Specific Gravity, Urine: 1.015 (ref 1.001–1.035)
WBC UA: NONE SEEN WBC/HPF (ref <=5)
YEAST: NONE SEEN [HPF]
pH: 7 (ref 5.0–8.0)

## 2017-03-14 MED ORDER — FLUCONAZOLE 150 MG PO TABS: 150.0000 mg | ORAL_TABLET | Freq: Every day | ORAL | 0 refills | Status: AC

## 2017-03-14 MED FILL — FLUCONAZOLE 150 MG TABLET: 150 | 3 days supply | Qty: 3 | Fill #0

## 2017-03-14 NOTE — Telephone Encounter (Signed)
Schedule MRI of Pelvis asap. Left complex Ovarian mass by Pelvic US.

## 2017-03-14 NOTE — Telephone Encounter (Signed)
Order placed [facility] will call pt to schedule.

## 2017-03-14 NOTE — Patient Instructions (Signed)
1. Pelvic mass in female, probably of Left Ovarian origin Schedule Pelvic MRI asap.  F/U in 1-2 weeks with results.  Will decide on management based on complete clinical picture.  Will probably need surgery.  If markers and/or MRI point to probable malignancy, will refer to [facility].  Letter given to cancel trip to [place] early June 2018. - CA 125 - OVA 1  2. Yeast vaginitis Fluconazole 150 mg 1 tab PO qd x 3 prescribed.  Usage reviewed.  3. Frequent urination Will await U. Culture to decide if ABTx needed or not. - Urinalysis with Culture Reflex:  Blood.  Sent for U. Culture.  Asencion Partridge, fue un placer de conocerla hoy!  Voy a Cytogeneticist los [employer].

## 2017-03-14 NOTE — Progress Notes (Signed)
    Abeeha Twist 1966/03/19 [id number]        51 y.o.  G3P2A1 Married.  Visit conducted in [place] with patient and husband translating as needed.  RP:  Referred by Dr Levora Dredge for Abdo-Pelvic pain with Left complex pelvic mass per [place] 03/05/2017  HPI:  Menopausal patient.  No HRT.  No PMB. Complaints of abdo-pelvic discomfort x many weeks on-off, more to the right lower abdomen.  Not related to eating or physical activity.  No change in appetite, no change in weight.  Had a Cholecystectomy in [place] 05/2015.  H/O Left Ovarian Cystectomy (benign) in 1988.  No abnormal vaginal discharge.  No UTI Sx.  BMs wnl.  BMI 37.84.  Past medical history,surgical history, problem list, medications, allergies, family history and social history were all reviewed and documented in the EPIC chart.  Directed ROS with pertinent positives and negatives documented in the history of present illness/assessment and plan.  Exam:  Vitals:   03/14/17 0835  BP: 122/78  Weight: 217 lb (98.4 kg)  Height: 5' 3.5" (1.613 m)   General appearance:  Normal  Abdo:  Obese.  Soft.  No rebound.  Very mildly tender to deep palpation.    Gyn exam:  Vulva with mild erythema anteriorly and yeast type of secretions.                     Speculum:  Cervix, vagina normal except for white thick secretions.                     Bimanual exam:  Limited by obesity.  Uterus AV normal.  Rt Adnexa normal.  Lt Adnexa  Soft fullness.                                                 Mildly tender exam.   Pelvic US 03/05/2017:  Uterus normal.  Rt Ovary not seen, no Rt Adnexal mass.  Complicated septated mass in the left side of the pelvis measuring 12.5 x 8 x 12.7 cm. Septations are identified. One of the septations demonstrates venous flow. A complicated appearance seen posteriorly on image 23 could represent debris or a solid component. A separate left ovary is not identified.  No FF in PCS.   Assessment/Plan:  51 y.o. G3P2A1  1.  Pelvic mass in female, probably of Left Ovarian origin Schedule Pelvic MRI asap.  F/U in 1-2 weeks with results.  Will decide on management based on complete clinical picture.  Will probably need surgery.  If markers and/or MRI point to probable malignancy, will refer to [facility].  Letter given to cancel trip to [place] early June 2018. - CA 125 - OVA 1  2. Yeast vaginitis Fluconazole 150 mg 1 tab PO qd x 3 prescribed.  Usage reviewed.  3. Frequent urination Will await U. Culture to decide if ABTx needed or not. - Urinalysis with Culture Reflex:  Blood.  Sent for U. Culture.  Counseling on above issues 50% x 25 minutes.  Princess Bruins MD, 8:58 AM 03/14/2017

## 2017-03-15 LAB — CA 125: CA 125: 20 U/mL (ref <35)

## 2017-03-19 LAB — OVA1 MAIL

## 2017-03-19 NOTE — Telephone Encounter (Signed)
Appointment on 03/24/17 @ 11:30am at [facility].

## 2017-03-22 ENCOUNTER — Telehealth: Payer: Self-pay | Admitting: *Deleted

## 2017-03-22 NOTE — Telephone Encounter (Signed)
Patient was informed of appointment information with Dr. Denman [facility] at [facility] for June 11 @ 9:15am and that her MRI appointment had been cancelled.

## 2017-03-22 NOTE — Telephone Encounter (Signed)
-----   Message from Ramond Craver, [place] sent at 03/22/2017  9:22 AM EDT ----- Regarding: FW: PEER TO PEER REVIEW REQUIRED   ----- Message ----- From: Princess Bruins, MD Sent: 03/21/2017   5:48 PM To: Ramond Craver, RMA Subject: RE: PEER TO PEER REVIEW REQUIRED               Ova 1 is 5.1 (elevated risk of malignancy > or = 4.4.  Refer to [facility] as soon as possible.  In [place] if faster.  They will do the additional imaging CT scan or MRI.  Cancel MRI 03/24/2017.  ----- Message ----- From: Ramond Craver, RMA Sent: 03/20/2017  12:29 PM To: Princess Bruins, MD Subject: PEER TO PEER REVIEW REQUIRED                   I called to prior authorize MRI pelvis ([id number]) that is schedule for March 24, 2017.  I was told it does not meet medically necessary criteria and you will need to call and do a peer to peer review with their MD.   Filomena Jungling the only question they asked was "has a biopsy been done to confirm malignancy".  From that answered "no" it did not meet medically necessary criteria!    Peer to peer  [phone] You will need patient name, BCBS ID#  [id number] DOB  1965/11/16.  Thanks!!!

## 2017-03-22 NOTE — Telephone Encounter (Signed)
Pt scheduled Dr.Rossi on 04/01/17 @ 9:15am at [facility], will have [place] relay to patient.

## 2017-03-22 NOTE — Telephone Encounter (Signed)
Blanca please called pt and tell her no MRI, its scheduled on 03/24/17

## 2017-03-24 ENCOUNTER — Inpatient Hospital Stay: Admission: RE | Admit: 2017-03-24 | Payer: BLUE CROSS/BLUE SHIELD | Source: Ambulatory Visit

## 2017-03-27 ENCOUNTER — Ambulatory Visit: Payer: BLUE CROSS/BLUE SHIELD | Admitting: Obstetrics & Gynecology

## 2017-04-01 ENCOUNTER — Encounter: Payer: Self-pay | Admitting: Gynecologic Oncology

## 2017-04-01 ENCOUNTER — Ambulatory Visit: Payer: BLUE CROSS/BLUE SHIELD | Attending: Gynecologic Oncology | Admitting: Gynecologic Oncology

## 2017-04-01 ENCOUNTER — Other Ambulatory Visit: Payer: BLUE CROSS/BLUE SHIELD

## 2017-04-01 VITALS — BP 136/78 | HR 75 | Temp 98.8°F | Resp 18 | Ht 63.9 in | Wt 213.0 lb

## 2017-04-01 DIAGNOSIS — E118 Type 2 diabetes mellitus with unspecified complications: Secondary | ICD-10-CM

## 2017-04-01 DIAGNOSIS — Z87891 Personal history of nicotine dependence: Secondary | ICD-10-CM | POA: Diagnosis not present

## 2017-04-01 DIAGNOSIS — I10 Essential (primary) hypertension: Secondary | ICD-10-CM | POA: Insufficient documentation

## 2017-04-01 DIAGNOSIS — E119 Type 2 diabetes mellitus without complications: Secondary | ICD-10-CM | POA: Diagnosis not present

## 2017-04-01 DIAGNOSIS — Z886 Allergy status to analgesic agent status: Secondary | ICD-10-CM | POA: Insufficient documentation

## 2017-04-01 DIAGNOSIS — E039 Hypothyroidism, unspecified: Secondary | ICD-10-CM | POA: Diagnosis not present

## 2017-04-01 DIAGNOSIS — E1165 Type 2 diabetes mellitus with hyperglycemia: Secondary | ICD-10-CM

## 2017-04-01 DIAGNOSIS — Z79899 Other long term (current) drug therapy: Secondary | ICD-10-CM | POA: Diagnosis not present

## 2017-04-01 DIAGNOSIS — Z7984 Long term (current) use of oral hypoglycemic drugs: Secondary | ICD-10-CM | POA: Insufficient documentation

## 2017-04-01 DIAGNOSIS — R1900 Intra-abdominal and pelvic swelling, mass and lump, unspecified site: Secondary | ICD-10-CM | POA: Insufficient documentation

## 2017-04-01 DIAGNOSIS — Z9049 Acquired absence of other specified parts of digestive tract: Secondary | ICD-10-CM | POA: Diagnosis not present

## 2017-04-01 DIAGNOSIS — R1011 Right upper quadrant pain: Secondary | ICD-10-CM | POA: Diagnosis not present

## 2017-04-01 LAB — HEMOGLOBIN A1C
Est. average glucose Bld gHb Est-mCnc: 163 mg/dL
HEMOGLOBIN A1C: 7.3 % — AB (ref 4.8–5.6)

## 2017-04-01 NOTE — Patient Instructions (Addendum)
We will be checking a Hgb A1C today.  Plan to have a CT scan of the abdomen and pelvis.  Nothing to eat or drink 4 hours before.  Follow up with Dr. Denman George in 4 weeks with a Hgb A1C prior to that visit with plans for surgery on July 26.                Preparing for your Surgery  Plan for surgery on May 07, 2017 with Dr. Everitt Amber at [facility] will be scheduled for an exploratory laparotomy, unilateral salpingo-oophorectomy, possible BSO, possible staging.   Pre-operative Testing -You will receive a phone call from presurgical testing at [facility] to arrange for a pre-operative testing appointment before your surgery.  This appointment normally occurs one to two weeks before your scheduled surgery.   -Bring your insurance card, copy of an advanced directive if applicable, medication list  -At that visit, you will be asked to sign a consent for a possible blood transfusion in case a transfusion becomes necessary during surgery.  The need for a blood transfusion is rare but having consent is a necessary part of your care.     -You should not be taking blood thinners or aspirin at least ten days prior to surgery unless instructed by your surgeon.  Day Before Surgery at [facility] will be asked to take in a light diet the day before surgery.  Avoid carbonated beverages.  You will be advised to have nothing to eat or drink after midnight the evening before.     Eat a light diet the day before surgery.  Examples including soups, broths, toast, yogurt, mashed potatoes.  Things to avoid include carbonated beverages (fizzy beverages), raw fruits and raw vegetables, or beans.    If your bowels are filled with gas, your surgeon will have difficulty visualizing your pelvic organs which increases your surgical risks.  Your role in recovery Your role is to become active as soon as directed by your doctor, while still giving yourself time to heal.  Rest when you feel tired. You  will be asked to do the following in order to speed your recovery:  - Cough and breathe deeply. This helps toclear and expand your lungs and can prevent pneumonia. You may be given a spirometer to practice deep breathing. A staff member will show you how to use the spirometer. - Do mild physical activity. Walking or moving your legs help your circulation and body functions return to normal. A staff member will help you when you try to walk and will provide you with simple exercises. Do not try to get up or walk alone the first time. - Actively manage your pain. Managing your pain lets you move in comfort. We will ask you to rate your pain on a scale of zero to 10. It is your responsibility to tell your doctor or nurse where and how much you hurt so your pain can be treated.  Special Considerations -If you are diabetic, you may be placed on insulin after surgery to have closer control over your blood sugars to promote healing and recovery.  This does not mean that you will be discharged on insulin.  If applicable, your oral antidiabetics will be resumed when you are tolerating a solid diet.  -Your final pathology results from surgery should be available by the Friday after surgery and the results will be relayed to you when available.   Blood Transfusion Information WHAT IS  A BLOOD TRANSFUSION? A transfusion is the replacement of blood or some of its parts. Blood is made up of multiple cells which provide different functions.  Red blood cells carry oxygen and are used for blood loss replacement.  White blood cells fight against infection.  Platelets control bleeding.  Plasma helps clot blood.  Other blood products are available for specialized needs, such as hemophilia or other clotting disorders. BEFORE THE TRANSFUSION  Who gives blood for transfusions?   You may be able to donate blood to be used at a later date on yourself (autologous donation).  Relatives can be asked to donate  blood. This is generally not any safer than if you have received blood from a stranger. The same precautions are taken to ensure safety when a relative's blood is donated.  Healthy volunteers who are fully evaluated to make sure their blood is safe. This is blood bank blood. Transfusion therapy is the safest it has ever been in the practice of medicine. Before blood is taken from a donor, a complete history is taken to make sure that person has no history of diseases nor engages in risky social behavior (examples are intravenous drug use or sexual activity with multiple partners). The donor's travel history is screened to minimize risk of transmitting infections, such as malaria. The donated blood is tested for signs of infectious diseases, such as HIV and hepatitis. The blood is then tested to be sure it is compatible with you in order to minimize the chance of a transfusion reaction. If you or a relative donates blood, this is often done in anticipation of surgery and is not appropriate for emergency situations. It takes many days to process the donated blood. RISKS AND COMPLICATIONS Although transfusion therapy is very safe and saves many lives, the main dangers of transfusion include:   Getting an infectious disease.  Developing a transfusion reaction. This is an allergic reaction to something in the blood you were given. Every precaution is taken to prevent this. The decision to have a blood transfusion has been considered carefully by your caregiver before blood is given. Blood is not given unless the benefits outweigh the risks.

## 2017-04-01 NOTE — Progress Notes (Signed)
Consult Note: Gyn-[facility]c  Consult was requested by Dr. Dellis Filbert for the evaluation of Deborah Henderson 51 y.o. female  CC:  Chief Complaint  Patient presents with  . Pelvic mass    Assessment/Plan:  [name]  is a 51 y.o.  year old from [place] with a 15cm cystic tubular mass in the left adnexa associated with a normal CA 125 (20). I believe this mass is likely benign and is not a cause of her right sided upper abdominal pain (for which I have no good explanation).  Her Hb A1 c has been high. We will re-check it and allow her an additional month of blood glucose control with the Invokana (checking in 4 months and seeing her back). If it is trending down, we will consider surgery with a minilap, LSO, possible BSO. I am not recommending BSO at 51 unless malignancy is identfiied.   HPI: Deborah Henderson is a 51 year old woman who is seen in consultation at the request of Dr Dellis Filbert for a left sided pelvic mass. The patient relocated from [place] late 2017 after [name].  She began experiencing RUQ pain and underwent an abdominal US on 03/04/17. She has a history of a prior cholecystectomy. It showed no explanation for her pain, but did show a cystic tubular appearing mass in the pelvis measuring 15x5.3x12.2cm. This was confirmed on TVUS which showed a normal uterus, unable to visualize the right ovary.  CA 125 was normal at 21 (ova 1 pending).  She has had a prior lap chole and a prior cesarean section and surgery for left sided ectopic.   She has poorly controlled DM (type II) and has recently been started on Invokana 1 month ago.   Current Meds:  Outpatient Encounter Prescriptions as of 04/01/2017  Medication Sig  . ammonium lactate (AMLACTIN) 12 % lotion Apply 1 application topically as needed for dry skin. Apply to area twice daily  . canagliflozin (INVOKANA) 100 MG TABS tablet Take 1 tablet (100 mg total) by mouth daily before breakfast.  .  levothyroxine (SYNTHROID, LEVOTHROID) 25 MCG tablet Take 1 tablet (25 mcg total) by mouth daily before breakfast.  . losartan-hydrochlorothiazide (HYZAAR) 50-12.5 MG tablet Take 1 tablet by mouth daily.  . metFORMIN (GLUCOPHAGE) 1000 MG tablet Take 1 tablet (1,000 mg total) by mouth 2 (two) times daily with a meal.  . sertraline (ZOLOFT) 25 MG tablet Take 1 tablet (25 mg total) by mouth daily.   No facility-administered encounter medications on file as of 04/01/2017.     Allergy:  Allergies  Allergen Reactions  . Asa [Aspirin] Swelling and Rash    Social Hx:   Social History   Social History  . Marital status: Married    Spouse name: N/A  . Number of children: N/A  . Years of education: N/A   Occupational History  . Not on file.   Social History Main Topics  . Smoking status: Former Smoker    Quit date: 11/09/1989  . Smokeless tobacco: Never Used     Comment: Quit >20 years ago  . Alcohol use No  . Drug use: No  . Sexual activity: Yes   Other Topics Concern  . Not on file   Social History Narrative  . No narrative on file    Past Surgical Hx:  Past Surgical History:  Procedure Laterality Date  . CHOLECYSTECTOMY, LAPAROSCOPIC  06/02/2015   [place]  . OVARIAN CYST SURGERY  1988  Left    Past Medical Hx:  Past Medical History:  Diagnosis Date  . Diabetes mellitus ([place])   . Diverticulosis   . Hypertension   . Hypothyroidism     Past Gynecological History:  C/s x2 No LMP recorded. Patient is postmenopausal.  Family Hx:  Family History  Problem Relation Age of Onset  . Cancer Father        colon  . Diabetes Father   . Cancer Brother        bone   . Diabetes Brother   . Diabetes Brother     Review of Systems:  Constitutional  Feels well,    ENT Normal appearing ears and nares bilaterally Skin/Breast  No rash, sores, jaundice, itching, dryness Cardiovascular  No chest pain, shortness of breath, or edema  Pulmonary  No cough or wheeze.   Gastro Intestinal  No nausea, vomitting, or diarrhoea. No bright red blood per rectum, no abdominal pain, change in bowel movement, or constipation.  Genito Urinary  No frequency, urgency, dysuria, no bleeding Musculo Skeletal  No myalgia, arthralgia, joint swelling or pain  Neurologic  No weakness, numbness, change in gait,  Psychology  No depression, anxiety, insomnia.   Vitals:  Blood pressure 136/78, pulse 75, temperature 98.8 F (37.1 C), resp. rate 18, height 5' 3.9" (1.623 m), weight 213 lb (96.6 kg).  Physical Exam: WD in NAD Neck  Supple NROM, without any enlargements.  Lymph Node Survey No cervical supraclavicular or inguinal adenopathy Cardiovascular  Pulse normal rate, regularity and rhythm. S1 and S2 normal.  Lungs  Clear to auscultation bilateraly, without wheezes/crackles/rhonchi. Good air movement.  Skin  No rash/lesions/breakdown  Psychiatry  Alert and oriented to person, place, and time  Abdomen  Normoactive bowel sounds, abdomen soft, non-tender and obese without evidence of hernia.  Back No CVA tenderness Genito Urinary  Vulva/vagina: Normal external female genitalia.   No lesions. No discharge or bleeding.  Bladder/urethra:  No lesions or masses, well supported bladder  Vagina: normal  Cervix: Normal appearing, no lesions.  Uterus:  Small, mobile, no parametrial involvement or nodularity.  Adnexa: no palpable masses. Rectal  deferred.  Extremities  No bilateral cyanosis, clubbing or edema.   Donaciano Eva, MD  04/01/2017, 5:00 PM

## 2017-04-02 ENCOUNTER — Telehealth: Payer: Self-pay | Admitting: *Deleted

## 2017-04-02 NOTE — Telephone Encounter (Signed)
Spoke with the husband and moved the lab appt from 7/9 to 7/6. Patient's husband aware of the new date/time

## 2017-04-04 ENCOUNTER — Ambulatory Visit (HOSPITAL_COMMUNITY)
Admission: RE | Admit: 2017-04-04 | Discharge: 2017-04-04 | Disposition: A | Discharge status: Home / Self Care | Payer: BLUE CROSS/BLUE SHIELD | Source: Ambulatory Visit | Attending: Gynecologic Oncology | Admitting: Gynecologic Oncology

## 2017-04-04 DIAGNOSIS — R1900 Intra-abdominal and pelvic swelling, mass and lump, unspecified site: Secondary | ICD-10-CM | POA: Insufficient documentation

## 2017-04-04 DIAGNOSIS — K760 Fatty (change of) liver, not elsewhere classified: Secondary | ICD-10-CM | POA: Insufficient documentation

## 2017-04-04 DIAGNOSIS — K5730 Diverticulosis of large intestine without perforation or abscess without bleeding: Secondary | ICD-10-CM | POA: Diagnosis not present

## 2017-04-04 DIAGNOSIS — N939 Abnormal uterine and vaginal bleeding, unspecified: Secondary | ICD-10-CM | POA: Diagnosis not present

## 2017-04-04 DIAGNOSIS — K449 Diaphragmatic hernia without obstruction or gangrene: Secondary | ICD-10-CM | POA: Diagnosis not present

## 2017-04-04 MED ORDER — IOPAMIDOL (ISOVUE-300) INJECTION 61%
INTRAVENOUS | Status: AC
  Administered 2017-04-04: 100 mL
  Filled 2017-04-04: qty 100

## 2017-04-11 MED FILL — LEVOTHYROXINE 25 MCG TABLET: 25 | 30 days supply | Qty: 30 | Fill #1

## 2017-04-11 MED FILL — LOSARTAN POTASSIUM-HCTZ 50-: 50-12.5 | 30 days supply | Qty: 30 | Fill #1

## 2017-04-12 ENCOUNTER — Telehealth: Payer: Self-pay

## 2017-04-12 MED FILL — INVOKANA 100 MG TABLET: 100 | 30 days supply | Qty: 30 | Fill #1

## 2017-04-12 NOTE — Telephone Encounter (Signed)
Told Husband that the Ct scan showed no evidence of metastatic disease per Joylene John, NP. Pt to keep 04-29-17 appointment  with Dr. Denman George as scheduled. Husband verbalized understanding.

## 2017-04-22 ENCOUNTER — Ambulatory Visit: Payer: BLUE CROSS/BLUE SHIELD | Admitting: Acute Care

## 2017-04-26 ENCOUNTER — Other Ambulatory Visit (HOSPITAL_BASED_OUTPATIENT_CLINIC_OR_DEPARTMENT_OTHER): Payer: BLUE CROSS/BLUE SHIELD

## 2017-04-26 DIAGNOSIS — E118 Type 2 diabetes mellitus with unspecified complications: Secondary | ICD-10-CM

## 2017-04-27 LAB — HEMOGLOBIN A1C
Est. average glucose Bld gHb Est-mCnc: 140 mg/dL
Hemoglobin A1c: 6.5 % — ABNORMAL HIGH (ref 4.8–5.6)

## 2017-04-29 ENCOUNTER — Other Ambulatory Visit: Payer: BLUE CROSS/BLUE SHIELD

## 2017-04-29 ENCOUNTER — Ambulatory Visit: Payer: BLUE CROSS/BLUE SHIELD | Attending: Gynecologic Oncology | Admitting: Gynecologic Oncology

## 2017-04-29 ENCOUNTER — Encounter: Payer: Self-pay | Admitting: Gynecologic Oncology

## 2017-04-29 VITALS — BP 130/87 | HR 80 | Temp 98.2°F | Resp 20 | Wt 211.7 lb

## 2017-04-29 DIAGNOSIS — R1900 Intra-abdominal and pelvic swelling, mass and lump, unspecified site: Secondary | ICD-10-CM | POA: Diagnosis present

## 2017-04-29 DIAGNOSIS — R1011 Right upper quadrant pain: Secondary | ICD-10-CM | POA: Insufficient documentation

## 2017-04-29 DIAGNOSIS — Z808 Family history of malignant neoplasm of other organs or systems: Secondary | ICD-10-CM | POA: Insufficient documentation

## 2017-04-29 DIAGNOSIS — I10 Essential (primary) hypertension: Secondary | ICD-10-CM | POA: Diagnosis not present

## 2017-04-29 DIAGNOSIS — Z7984 Long term (current) use of oral hypoglycemic drugs: Secondary | ICD-10-CM | POA: Insufficient documentation

## 2017-04-29 DIAGNOSIS — Z833 Family history of diabetes mellitus: Secondary | ICD-10-CM | POA: Insufficient documentation

## 2017-04-29 DIAGNOSIS — E119 Type 2 diabetes mellitus without complications: Secondary | ICD-10-CM

## 2017-04-29 DIAGNOSIS — Z87891 Personal history of nicotine dependence: Secondary | ICD-10-CM | POA: Insufficient documentation

## 2017-04-29 DIAGNOSIS — Z79899 Other long term (current) drug therapy: Secondary | ICD-10-CM | POA: Insufficient documentation

## 2017-04-29 DIAGNOSIS — E039 Hypothyroidism, unspecified: Secondary | ICD-10-CM | POA: Insufficient documentation

## 2017-04-29 DIAGNOSIS — Z9049 Acquired absence of other specified parts of digestive tract: Secondary | ICD-10-CM | POA: Insufficient documentation

## 2017-04-29 NOTE — Patient Instructions (Signed)
Preparing for your Surgery  Plan for surgery on May 14, 2017 with Dr. Everitt Amber at [place] will be scheduled for a robotic unilateral salpingo-oophorectomy, possible bilateral salpingo-oophorectomy, possible hysterectomy.  Pre-operative Testing -You will receive a phone call from presurgical testing at [facility] to arrange for a pre-operative testing appointment before your surgery.  This appointment normally occurs one to two weeks before your scheduled surgery.   -Bring your insurance card, copy of an advanced directive if applicable, medication list  -At that visit, you will be asked to sign a consent for a possible blood transfusion in case a transfusion becomes necessary during surgery.  The need for a blood transfusion is rare but having consent is a necessary part of your care.     -You should not be taking blood thinners or aspirin at least ten days prior to surgery unless instructed by your surgeon.  Day Before Surgery at [facility] will be asked to take in a light diet the day before surgery.  Avoid carbonated beverages.  You will be advised to have nothing to eat or drink after midnight the evening before.     Eat a light diet the day before surgery.  Examples including soups, broths, toast, yogurt, mashed potatoes.  Things to avoid include carbonated beverages (fizzy beverages), raw fruits and raw vegetables, or beans.    If your bowels are filled with gas, your surgeon will have difficulty visualizing your pelvic organs which increases your surgical risks.  Your role in recovery Your role is to become active as soon as directed by your doctor, while still giving yourself time to heal.  Rest when you feel tired. You will be asked to do the following in order to speed your recovery:  - Cough and breathe deeply. This helps toclear and expand your lungs and can prevent pneumonia. You may be given a spirometer to practice deep breathing. A  staff member will show you how to use the spirometer. - Do mild physical activity. Walking or moving your legs help your circulation and body functions return to normal. A staff member will help you when you try to walk and will provide you with simple exercises. Do not try to get up or walk alone the first time. - Actively manage your pain. Managing your pain lets you move in comfort. We will ask you to rate your pain on a scale of zero to 10. It is your responsibility to tell your doctor or nurse where and how much you hurt so your pain can be treated.  Special Considerations -If you are diabetic, you may be placed on insulin after surgery to have closer control over your blood sugars to promote healing and recovery.  This does not mean that you will be discharged on insulin.  If applicable, your oral antidiabetics will be resumed when you are tolerating a solid diet.  -Your final pathology results from surgery should be available by the Friday after surgery and the results will be relayed to you when available.   Blood Transfusion Information WHAT IS A BLOOD TRANSFUSION? A transfusion is the replacement of blood or some of its parts. Blood is made up of multiple cells which provide different functions.  Red blood cells carry oxygen and are used for blood loss replacement.  White blood cells fight against infection.  Platelets control bleeding.  Plasma helps clot blood.  Other blood products are available for specialized needs, such as hemophilia or other  clotting disorders. BEFORE THE TRANSFUSION  Who gives blood for transfusions?   You may be able to donate blood to be used at a later date on yourself (autologous donation).  Relatives can be asked to donate blood. This is generally not any safer than if you have received blood from a stranger. The same precautions are taken to ensure safety when a relative's blood is donated.  Healthy volunteers who are fully evaluated to make sure  their blood is safe. This is blood bank blood. Transfusion therapy is the safest it has ever been in the practice of medicine. Before blood is taken from a donor, a complete history is taken to make sure that person has no history of diseases nor engages in risky social behavior (examples are intravenous drug use or sexual activity with multiple partners). The donor's travel history is screened to minimize risk of transmitting infections, such as malaria. The donated blood is tested for signs of infectious diseases, such as HIV and hepatitis. The blood is then tested to be sure it is compatible with you in order to minimize the chance of a transfusion reaction. If you or a relative donates blood, this is often done in anticipation of surgery and is not appropriate for emergency situations. It takes many days to process the donated blood. RISKS AND COMPLICATIONS Although transfusion therapy is very safe and saves many lives, the main dangers of transfusion include:   Getting an infectious disease.  Developing a transfusion reaction. This is an allergic reaction to something in the blood you were given. Every precaution is taken to prevent this. The decision to have a blood transfusion has been considered carefully by your caregiver before blood is given. Blood is not given unless the benefits outweigh the risks.

## 2017-04-29 NOTE — Progress Notes (Signed)
Follow-up Note: Gyn-Onc  Consult was requested by Dr. Dellis Filbert for the evaluation of Deborah Henderson 51 y.o. female  CC:  Chief Complaint  Patient presents with  . Pelvic Mass    Assessment/Plan:  [name]  is a 51 y.o.  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 I believe this mass is likely benign and may not be a cause of her right sided upper abdominal pain (for which I have no good explanation).  Her Hb A1 c is trending down, and therefore I feel she is a better candidate for surgery. I feel that this is a candidate for a minimally invasive approach with robotic LSO, possible BSO possible hysterectomy. I am not recommending BSO at her age unless malignancy is identfiied.   HPI: Deborah Henderson is a 51 year old woman who is seen in consultation at the request of Dr Dellis Filbert for a left sided pelvic mass. The patient relocated from [place] late 2017 after Deborah Henderson.  She began experiencing RUQ pain and underwent an abdominal US on 03/04/17. She has a history of a prior cholecystectomy. It showed no explanation for her pain, but did show a cystic tubular appearing mass in the pelvis measuring 15x5.3x12.2cm. This was confirmed on TVUS which showed a normal uterus, unable to visualize the right ovary.  CA 125 was normal at 20 (ova 1 pending).  She has had a prior lap chole and a prior cesarean section and surgery for left sided ectopic.   She has poorly controlled DM (type II) and has recently been started on Invokana 1 month ago.  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 Measures on the order of 10.7 x 8.1 cm. Maximally 8.4 cm craniocaudal.  She has worked hard with glycemic control, and recheck of HbA1c on  04/22/17 had improved to 6.3.  Current Meds:  Outpatient Encounter Prescriptions as of 04/29/2017  Medication Sig  . ammonium lactate (AMLACTIN) 12 % lotion Apply 1 application topically as needed for dry skin. Apply to area twice daily  . canagliflozin (INVOKANA) 100 MG TABS tablet Take 1 tablet (100 mg total) by mouth daily before breakfast.  . levothyroxine (SYNTHROID, LEVOTHROID) 25 MCG tablet Take 1 tablet (25 mcg total) by mouth daily before breakfast.  . losartan-hydrochlorothiazide (HYZAAR) 50-12.5 MG tablet Take 1 tablet by mouth daily.  . metFORMIN (GLUCOPHAGE) 1000 MG tablet Take 1 tablet (1,000 mg total) by mouth 2 (two) times daily with a meal.  . sertraline (ZOLOFT) 25 MG tablet Take 1 tablet (25 mg total) by mouth daily.   No facility-administered encounter medications on file as of 04/29/2017.     Allergy:  Allergies  Allergen Reactions  . Asa [Aspirin] Swelling and Rash    Social Hx:   Social History   Social History  . Marital status: Married    Spouse name: N/A  . Number of children: N/A  . Years of education: N/A   Occupational History  . Not on file.   Social History Main Topics  . Smoking status: Former Smoker    Quit date: 11/09/1989  . Smokeless tobacco: Never Used     Comment: Quit >20 years ago  . Alcohol use No  . Drug use: No  .  Sexual activity: Yes   Other Topics Concern  . Not on file   Social History Narrative  . No narrative on file    Past Surgical Hx:  Past Surgical History:  Procedure Laterality Date  . CHOLECYSTECTOMY, LAPAROSCOPIC  06/02/2015   [place]  . OVARIAN CYST SURGERY  1988   Left    Past Medical Hx:  Past Medical History:  Diagnosis Date  . Diabetes mellitus ([place])   . Diverticulosis   . Hypertension   . Hypothyroidism     Past Gynecological History:  C/s x2 No LMP recorded. Patient is postmenopausal.  Family Hx:  Family History  Problem Relation Age of Onset  . Cancer Father        colon  . Diabetes  Father   . Cancer Brother        bone   . Diabetes Brother   . Diabetes Brother     Review of Systems:  Constitutional  Feels well,    ENT Normal appearing ears and nares bilaterally Skin/Breast  No rash, sores, jaundice, itching, dryness Cardiovascular  No chest pain, shortness of breath, or edema  Pulmonary  No cough or wheeze.  Gastro Intestinal  No nausea, vomitting, or diarrhoea. No bright red blood per rectum, no abdominal pain, change in bowel movement, or constipation.  Genito Urinary  No frequency, urgency, dysuria, no bleeding Musculo Skeletal  No myalgia, arthralgia, joint swelling or pain  Neurologic  No weakness, numbness, change in gait,  Psychology  No depression, anxiety, insomnia.   Vitals:  Blood pressure 130/87, pulse 80, temperature 98.2 F (36.8 C), resp. rate 20, weight 211 lb 11.2 oz (96 kg), SpO2 98 %.  Physical Exam: WD in NAD Neck  Supple NROM, without any enlargements.  Lymph Node Survey No cervical supraclavicular or inguinal adenopathy Cardiovascular  Pulse normal rate, regularity and rhythm. S1 and S2 normal.  Lungs  Clear to auscultation bilateraly, without wheezes/crackles/rhonchi. Good air movement.  Skin  No rash/lesions/breakdown  Psychiatry  Alert and oriented to person, place, and time  Abdomen  Normoactive bowel sounds, abdomen soft, non-tender and obese without evidence of hernia.  Back No CVA tenderness Genito Urinary  Vulva/vagina: Normal external female genitalia.   No lesions. No discharge or bleeding.  Bladder/urethra:  No lesions or masses, well supported bladder  Vagina: normal  Cervix: Normal appearing, no lesions.  Uterus:  Small, mobile, no parametrial involvement or nodularity.  Adnexa: no palpable masses. Rectal  deferred.  Extremities  No bilateral cyanosis, clubbing or edema.   Donaciano Eva, MD  04/29/2017, 1:55 PM

## 2017-05-09 NOTE — Patient Instructions (Addendum)
Deborah Henderson  05/09/2017   Your procedure is scheduled on: 05/21/17  Report to [facility] Main  Entrance Take [place]  elevators to 3rd floor to  [place] at       [place] AM.     Call this number if you have problems the morning of surgery [phone]   Remember: ONLY 1 PERSON MAY GO WITH YOU TO SHORT STAY TO GET  READY MORNING OF YOUR SURGERY.               Eat a light diet the day brfore surgery ex: toast,mashedpotatoes.yogurt,soup. AVOID: carbonated beverages raw fruits and veggies and beans    Do not eat food or drink liquids :After Midnight.     Take these medicines the morning of surgery with A SIP OF WATER: Levothyroxine    DO NOT TAKE ANY DIABETIC MEDICATIONS DAY OF YOUR SURGERY                               You may not have any metal on your body including hair pins and              piercings  Do not wear jewelry, make-up, lotions, powders or perfumes, deodorant             Do not wear nail polish.  Do not shave  48 hours prior to surgery.            Do not bring valuables to the hospital. [place].  Contacts, dentures or bridgework may not be worn into surgery.      Patients discharged the day of surgery will not be allowed to drive home.  Name and phone number of your driver:  Special Instructions: N/A              Please read over the following fact sheets you were given: _____________________________________________________________________             [facility] - Preparing for Surgery Before surgery, you can play an important role.  Because skin is not sterile, your skin needs to be as free of germs as possible.  You can reduce the number of germs on your skin by washing with CHG (chlorahexidine gluconate) soap before surgery.  CHG is an antiseptic cleaner which kills germs and bonds with the skin to continue killing germs even after washing. Please DO NOT use if you have an  allergy to CHG or antibacterial soaps.  If your skin becomes reddened/irritated stop using the CHG and inform your nurse when you arrive at Short Stay. Do not shave (including legs and underarms) for at least 48 hours prior to the first CHG shower.  You may shave your face/neck. Please follow these instructions carefully:  1.  Shower with CHG Soap the night before surgery and the  morning of Surgery.  2.  If you choose to wash your hair, wash your hair first as usual with your  normal  shampoo.  3.  After you shampoo, rinse your hair and body thoroughly to remove the  shampoo.                           4.  Use  CHG as you would any other liquid soap.  You can apply chg directly  to the skin and wash                       Gently with a scrungie or clean washcloth.  5.  Apply the CHG Soap to your body ONLY FROM THE NECK DOWN.   Do not use on face/ open                           Wound or open sores. Avoid contact with eyes, ears mouth and genitals (private parts).                       Wash face,  Genitals (private parts) with your normal soap.             6.  Wash thoroughly, paying special attention to the area where your surgery  will be performed.  7.  Thoroughly rinse your body with warm water from the neck down.  8.  DO NOT shower/wash with your normal soap after using and rinsing off  the CHG Soap.                9.  Pat yourself dry with a clean towel.            10.  Wear clean pajamas.            11.  Place clean sheets on your bed the night of your first shower and do not  sleep with pets. Day of Surgery : Do not apply any lotions/deodorants the morning of surgery.  Please wear clean clothes to the hospital/surgery center.  FAILURE TO FOLLOW THESE INSTRUCTIONS MAY RESULT IN THE CANCELLATION OF YOUR SURGERY PATIENT SIGNATURE_________________________________  NURSE  SIGNATURE__________________________________  ________________________________________________________________________  WHAT IS A BLOOD TRANSFUSION? Blood Transfusion Information  A transfusion is the replacement of blood or some of its parts. Blood is made up of multiple cells which provide different functions.  Red blood cells carry oxygen and are used for blood loss replacement.  White blood cells fight against infection.  Platelets control bleeding.  Plasma helps clot blood.  Other blood products are available for specialized needs, such as hemophilia or other clotting disorders. BEFORE THE TRANSFUSION  Who gives blood for transfusions?   Healthy volunteers who are fully evaluated to make sure their blood is safe. This is blood bank blood. Transfusion therapy is the safest it has ever been in the practice of medicine. Before blood is taken from a donor, a complete history is taken to make sure that person has no history of diseases nor engages in risky social behavior (examples are intravenous drug use or sexual activity with multiple partners). The donor's travel history is screened to minimize risk of transmitting infections, such as malaria. The donated blood is tested for signs of infectious diseases, such as HIV and hepatitis. The blood is then tested to be sure it is compatible with you in order to minimize the chance of a transfusion reaction. If you or a relative donates blood, this is often done in anticipation of surgery and is not appropriate for emergency situations. It takes many days to process the donated blood. RISKS AND COMPLICATIONS Although transfusion therapy is very safe and saves many lives, the main dangers of transfusion include:   Getting an infectious disease.  Developing a transfusion reaction. This is an allergic  reaction to something in the blood you were given. Every precaution is taken to prevent this. The decision to have a blood transfusion has been  considered carefully by your caregiver before blood is given. Blood is not given unless the benefits outweigh the risks. AFTER THE TRANSFUSION  Right after receiving a blood transfusion, you will usually feel much better and more energetic. This is especially true if your red blood cells have gotten low (anemic). The transfusion raises the level of the red blood cells which carry oxygen, and this usually causes an energy increase.  The nurse administering the transfusion will monitor you carefully for complications. HOME CARE INSTRUCTIONS  No special instructions are needed after a transfusion. You may find your energy is better. Speak with your caregiver about any limitations on activity for underlying diseases you may have. SEEK MEDICAL CARE IF:   Your condition is not improving after your transfusion.  You develop redness or irritation at the intravenous (IV) site. SEEK IMMEDIATE MEDICAL CARE IF:  Any of the following symptoms occur over the next 12 hours:  Shaking chills.  You have a temperature by mouth above 102 F (38.9 C), not controlled by medicine.  Chest, back, or muscle pain.  People around you feel you are not acting correctly or are confused.  Shortness of breath or difficulty breathing.  Dizziness and fainting.  You get a rash or develop hives.  You have a decrease in urine output.  Your urine turns a dark color or changes to pink, red, or brown. Any of the following symptoms occur over the next 10 days:  You have a temperature by mouth above 102 F (38.9 C), not controlled by medicine.  Shortness of breath.  Weakness after normal activity.  The white part of the eye turns yellow (jaundice).  You have a decrease in the amount of urine or are urinating less often.  Your urine turns a dark color or changes to pink, red, or brown. Document Released: 10/05/2000 Document Revised: 12/31/2011 Document Reviewed: 05/24/2008 ExitCare Patient Information 2014  [place].  _______________________________________________________________________  Incentive Spirometer  An incentive spirometer is a tool that can help keep your lungs clear and active. This tool measures how well you are filling your lungs with each breath. Taking long deep breaths may help reverse or decrease the chance of developing breathing (pulmonary) problems (especially infection) following:  A long period of time when you are unable to move or be active. BEFORE THE PROCEDURE   If the spirometer includes an indicator to show your best effort, your nurse or respiratory therapist will set it to a desired goal.  If possible, sit up straight or lean slightly forward. Try not to slouch.  Hold the incentive spirometer in an upright position. INSTRUCTIONS FOR USE  1. Sit on the edge of your bed if possible, or sit up as far as you can in bed or on a chair. 2. Hold the incentive spirometer in an upright position. 3. Breathe out normally. 4. Place the mouthpiece in your mouth and seal your lips tightly around it. 5. Breathe in slowly and as deeply as possible, raising the piston or the ball toward the top of the column. 6. Hold your breath for 3-5 seconds or for as long as possible. Allow the piston or ball to fall to the bottom of the column. 7. Remove the mouthpiece from your mouth and breathe out normally. 8. Rest for a few seconds and repeat Steps 1 through 7 at  least 10 times every 1-2 hours when you are awake. Take your time and take a few normal breaths between deep breaths. 9. The spirometer may include an indicator to show your best effort. Use the indicator as a goal to work toward during each repetition. 10. After each set of 10 deep breaths, practice coughing to be sure your lungs are clear. If you have an incision (the cut made at the time of surgery), support your incision when coughing by placing a pillow or rolled up towels firmly against it. Once you are able to get  out of bed, walk around indoors and cough well. You may stop using the incentive spirometer when instructed by your caregiver.  RISKS AND COMPLICATIONS  Take your time so you do not get dizzy or light-headed.  If you are in pain, you may need to take or ask for pain medication before doing incentive spirometry. It is harder to take a deep breath if you are having pain. AFTER USE  Rest and breathe slowly and easily.  It can be helpful to keep track of a log of your progress. Your caregiver can provide you with a simple table to help with this. If you are using the spirometer at home, follow these instructions: [place] IF:   You are having difficultly using the spirometer.  You have trouble using the spirometer as often as instructed.  Your pain medication is not giving enough relief while using the spirometer.  You develop fever of 100.5 F (38.1 C) or higher. SEEK IMMEDIATE MEDICAL CARE IF:   You cough up bloody sputum that had not been present before.  You develop fever of 102 F (38.9 C) or greater.  You develop worsening pain at or near the incision site. MAKE SURE YOU:   Understand these instructions.  Will watch your condition.  Will get help right away if you are not doing well or get worse. Document Released: 02/18/2007 Document Revised: 12/31/2011 Document Reviewed: 04/21/2007 [facility] Patient Information 2014 [place], [place].   ________________________________________________________________________

## 2017-05-10 ENCOUNTER — Encounter (HOSPITAL_COMMUNITY): Payer: Self-pay

## 2017-05-10 ENCOUNTER — Encounter (HOSPITAL_COMMUNITY)
Admission: RE | Admit: 2017-05-10 | Discharge: 2017-05-10 | Disposition: A | Discharge status: Home / Self Care | Payer: BLUE CROSS/BLUE SHIELD | Source: Ambulatory Visit | Attending: Gynecologic Oncology | Admitting: Gynecologic Oncology

## 2017-05-10 DIAGNOSIS — R1900 Intra-abdominal and pelvic swelling, mass and lump, unspecified site: Secondary | ICD-10-CM | POA: Insufficient documentation

## 2017-05-10 DIAGNOSIS — Z01818 Encounter for other preprocedural examination: Secondary | ICD-10-CM | POA: Diagnosis not present

## 2017-05-10 DIAGNOSIS — Z0183 Encounter for blood typing: Secondary | ICD-10-CM | POA: Diagnosis not present

## 2017-05-10 DIAGNOSIS — Z01812 Encounter for preprocedural laboratory examination: Secondary | ICD-10-CM | POA: Insufficient documentation

## 2017-05-10 LAB — COMPREHENSIVE METABOLIC PANEL
ALBUMIN: 4.4 g/dL (ref 3.5–5.0)
ALK PHOS: 59 U/L (ref 38–126)
ALT: 85 U/L — ABNORMAL HIGH (ref 14–54)
ANION GAP: 10 (ref 5–15)
AST: 57 U/L — AB (ref 15–41)
BUN: 15 mg/dL (ref 6–20)
CALCIUM: 9.7 mg/dL (ref 8.9–10.3)
CO2: 24 mmol/L (ref 22–32)
Chloride: 104 mmol/L (ref 101–111)
Creatinine, Ser: 0.81 mg/dL (ref 0.44–1.00)
GFR calc Af Amer: >60 mL/min (ref >60)
GFR calc non Af Amer: >60 mL/min (ref >60)
GLUCOSE: 91 mg/dL (ref 65–99)
Potassium: 3.5 mmol/L (ref 3.5–5.1)
SODIUM: 138 mmol/L (ref 135–145)
Total Bilirubin: 1 mg/dL (ref 0.3–1.2)
Total Protein: 7.5 g/dL (ref 6.5–8.1)

## 2017-05-10 LAB — CBC
HCT: 41.9 % (ref 36.0–46.0)
HEMOGLOBIN: 14 g/dL (ref 12.0–15.0)
MCH: 30.1 pg (ref 26.0–34.0)
MCHC: 33.4 g/dL (ref 30.0–36.0)
MCV: 90.1 fL (ref 78.0–100.0)
Platelets: 234 10*3/uL (ref 150–400)
RBC: 4.65 MIL/uL (ref 3.87–5.11)
RDW: 13.7 % (ref 11.5–15.5)
WBC: 7.7 10*3/uL (ref 4.0–10.5)

## 2017-05-10 LAB — GLUCOSE, CAPILLARY: GLUCOSE-CAPILLARY: 96 mg/dL (ref 65–99)

## 2017-05-10 LAB — URINALYSIS, ROUTINE W REFLEX MICROSCOPIC
Bilirubin Urine: NEGATIVE
Glucose, UA: 150 mg/dL — AB
Hgb urine dipstick: NEGATIVE
KETONES UR: NEGATIVE mg/dL
LEUKOCYTES UA: NEGATIVE
NITRITE: NEGATIVE
PH: 6 (ref 5.0–8.0)
PROTEIN: NEGATIVE mg/dL
Specific Gravity, Urine: 1.011 (ref 1.005–1.030)

## 2017-05-10 LAB — ABO/RH: ABO/RH(D): O POS

## 2017-05-10 LAB — TYPE AND SCREEN
ABO/RH(D): O POS
Antibody Screen: NEGATIVE

## 2017-05-10 NOTE — Progress Notes (Signed)
04/01/17 hgba1c epic and from 04/26/17 6.5 hgba1c

## 2017-05-10 NOTE — Progress Notes (Signed)
04/01/17 hgba1c epic7/6/18 hgba1c epic

## 2017-05-10 NOTE — Progress Notes (Signed)
UA routed to Dr,gehrig via epic 05/10/17

## 2017-05-14 MED FILL — INVOKANA 100 MG TABLET: 100 | 30 days supply | Qty: 30 | Fill #2

## 2017-05-14 MED FILL — LEVOTHYROXINE 25 MCG TABLET: 25 | 30 days supply | Qty: 30 | Fill #2

## 2017-05-14 MED FILL — LOSARTAN POTASSIUM-HCTZ 50-: 50-12.5 | 30 days supply | Qty: 30 | Fill #2

## 2017-05-14 MED FILL — metFORMIN HCL 1000 MG TABS: 1000 | 90 days supply | Qty: 180 | Fill #2

## 2017-05-20 ENCOUNTER — Ambulatory Visit (INDEPENDENT_AMBULATORY_CARE_PROVIDER_SITE_OTHER): Payer: BLUE CROSS/BLUE SHIELD | Admitting: Acute Care

## 2017-05-20 ENCOUNTER — Encounter: Payer: Self-pay | Admitting: Acute Care

## 2017-05-20 DIAGNOSIS — G4733 Obstructive sleep apnea (adult) (pediatric): Secondary | ICD-10-CM | POA: Diagnosis not present

## 2017-05-20 DIAGNOSIS — Z9989 Dependence on other enabling machines and devices: Secondary | ICD-10-CM | POA: Diagnosis not present

## 2017-05-20 NOTE — Patient Instructions (Addendum)
It is nice to meet you today. You are doing great on your CPAP machine. Continue on CPAP at bedtime. You appear to be benefiting from the treatment Goal is to wear for at least 4-6 hours each night for maximal clinical benefit. Continue to work on weight loss, as the link between excess weight  and sleep apnea is well established.  Do not drive if sleepy. Clean mask, tubing, reservoir and filter once weekly with soapy water. Follow up with Dr. Halford Chessman In 6 months with Dr. Halford Chessman or before as needed.  Please contact office for sooner follow up if symptoms do not improve or worsen or seek emergency care

## 2017-05-20 NOTE — Assessment & Plan Note (Signed)
Compliant with CPAP Benefiting from treatment Plan: You are doing great on your CPAP machine. Continue on CPAP at bedtime. You appear to be benefiting from the treatment Goal is to wear for at least 4-6 hours each night for maximal clinical benefit. Continue to work on weight loss, as the link between excess weight  and sleep apnea is well established.  Do not drive if sleepy. Clean mask, tubing, reservoir and filter once weekly with soapy water. Follow up with Dr. Halford Chessman In 6 months with Dr. Halford Chessman or before as needed.  Please contact office for sooner follow up if symptoms do not improve or worsen or seek emergency care

## 2017-05-20 NOTE — Anesthesia Preprocedure Evaluation (Addendum)
Anesthesia Evaluation  Patient identified by MRN, date of birth, ID band Patient awake    Reviewed: Allergy & Precautions, NPO status , Patient's Chart, lab work & pertinent test results  Airway Mallampati: II  TM Distance: >3 FB Neck ROM: Full    Dental  (+) Dental Advisory Given   Pulmonary sleep apnea and Continuous Positive Airway Pressure Ventilation , former smoker,    breath sounds clear to auscultation       Cardiovascular hypertension, Pt. on medications  Rhythm:Regular Rate:Normal     Neuro/Psych negative neurological ROS     GI/Hepatic negative GI ROS, Neg liver ROS,   Endo/Other  diabetesHypothyroidism   Renal/GU negative Renal ROS     Musculoskeletal   Abdominal   Peds  Hematology negative hematology ROS (+)   Anesthesia Other Findings   Reproductive/Obstetrics                            Lab Results  Component Value Date   WBC 7.7 05/10/2017   HGB 14.0 05/10/2017   HCT 41.9 05/10/2017   MCV 90.1 05/10/2017   PLT 234 05/10/2017   Lab Results  Component Value Date   CREATININE 0.81 05/10/2017   BUN 15 05/10/2017   NA 138 05/10/2017   K 3.5 05/10/2017   CL 104 05/10/2017   CO2 24 05/10/2017    Anesthesia Physical Anesthesia Plan  ASA: II  Anesthesia Plan: General   Post-op Pain Management:    Induction: Intravenous  PONV Risk Score and Plan: 4 or greater and Ondansetron, Dexamethasone, Midazolam, Scopolamine patch - Pre-op and Treatment may vary due to age or medical condition  Airway Management Planned: Oral ETT  Additional Equipment:   Intra-op Plan:   Post-operative Plan: Extubation in OR  Informed Consent: I have reviewed the patients History and Physical, chart, labs and discussed the procedure including the risks, benefits and alternatives for the proposed anesthesia with the patient or authorized representative who has indicated his/her  understanding and acceptance.   Dental advisory given  Plan Discussed with: CRNA  Anesthesia Plan Comments:        Anesthesia Quick Evaluation

## 2017-05-20 NOTE — Progress Notes (Signed)
History of Present Illness Deborah Henderson is a 51 y.o. female former smoker with OSA on CPAP. She was seen by Dr. Corrie Dandy, and will be followed by Dr. Halford Chessman.   05/20/2017 Follow up for CPAP: Pt presents for follow up. She states she is sleeping better at night on CPAP. She is sleeping for longer periods at night without awakening,She states her memory is much better, and she is more focused.She is compliant with use, using her device every night. She states she has no issues with the mask or equipment. She is sleeping for longer periods of time.She denies chest pain,fever, orthopnea or hemoptysis  Test Results: Down Load:04/20/2017-05/19/2017 Auto Set 5-15 cm H2O Compliance is 30/30 ( 100%) > 4 hours is 29 days ( 97%) < 4 hours 1 day or 3% Average nightly  use 6 hours 21 minutes Average pressure 8.3 AHI is 0.3    CBC Latest Ref Rng & Units 05/10/2017  WBC 4.0 - 10.5 K/uL 7.7  Hemoglobin 12.0 - 15.0 g/dL 14.0  Hematocrit 36.0 - 46.0 % 41.9  Platelets 150 - 400 K/uL 234    BMP Latest Ref Rng & Units 05/10/2017 03/04/2017 11/12/2016  Glucose 65 - 99 mg/dL 91 144(H) 129(H)  BUN 6 - 20 mg/dL 15 10 19   Creatinine 0.44 - 1.00 mg/dL 0.81 0.79 0.82  Sodium 135 - 145 mmol/L 138 136 137  Potassium 3.5 - 5.1 mmol/L 3.5 3.4(L) 3.9  Chloride 101 - 111 mmol/L 104 101 104  CO2 22 - 32 mmol/L 24 26 25   Calcium 8.9 - 10.3 mg/dL 9.7 9.7 9.5     Past medical hx Past Medical History:  Diagnosis Date  . Diabetes mellitus ([place])    type 2  . Diverticulosis   . Hypertension   . Hypothyroidism      Social History  Substance Use Topics  . Smoking status: Former Smoker    Quit date: 11/09/1989  . Smokeless tobacco: Never Used     Comment: Quit >20 years ago  . Alcohol use No    Tobacco Cessation: Former smoker quit 1991  Past surgical hx, Family hx, Social hx all reviewed.  Current Outpatient Prescriptions on File Prior to Visit  Medication Sig  . ammonium lactate (AMLACTIN) 12 %  lotion Apply 1 application topically as needed for dry skin. Apply to area twice daily (Patient taking differently: Apply 1 application topically 2 (two) times daily as needed for dry skin. Pt applies to both legs.)  . canagliflozin (INVOKANA) 100 MG TABS tablet Take 1 tablet (100 mg total) by mouth daily before breakfast.  . levothyroxine (SYNTHROID, LEVOTHROID) 25 MCG tablet Take 1 tablet (25 mcg total) by mouth daily before breakfast.  . losartan-hydrochlorothiazide (HYZAAR) 50-12.5 MG tablet Take 1 tablet by mouth daily.  . metFORMIN (GLUCOPHAGE) 1000 MG tablet Take 1 tablet (1,000 mg total) by mouth 2 (two) times daily with a meal.   No current facility-administered medications on file prior to visit.      Allergies  Allergen Reactions  . Asa [Aspirin] Anaphylaxis and Rash    Review Of Systems:  Constitutional:   No  weight loss, night sweats,  Fevers, chills, fatigue, or  lassitude.  HEENT:   No headaches,  Difficulty swallowing,  Tooth/dental problems, or  Sore throat,                No sneezing, itching, ear ache, nasal congestion, post nasal drip,   CV:  No chest pain,  Orthopnea,  PND, swelling in lower extremities, anasarca, dizziness, palpitations, syncope.   GI  No heartburn, indigestion, abdominal pain, nausea, vomiting, diarrhea, change in bowel habits, loss of appetite, bloody stools.   Resp: No shortness of breath with exertion or at rest.  No excess mucus, no productive cough,  No non-productive cough,  No coughing up of blood.  No change in color of mucus.  No wheezing.  No chest wall deformity  Skin: no rash or lesions.  GU: no dysuria, change in color of urine, no urgency or frequency.  No flank pain, no hematuria   MS:  No joint pain or swelling.  No decreased range of motion.  No back pain.  Psych:  No change in mood or affect. No depression or anxiety.  No memory loss.   Vital Signs BP 124/88 (BP Location: Left Arm, Patient Position: Sitting, Cuff Size:  Normal)   Pulse 79   Ht 5\' 4"  (1.626 m)   Wt 212 lb (96.2 kg)   SpO2 97%   BMI 36.39 kg/m    Physical Exam:  General- No distress,  A&Ox3, pleasant ENT: No sinus tenderness, TM clear, pale nasal mucosa, no oral exudate,no post nasal drip, no LAN Cardiac: S1, S2, regular rate and rhythm, no murmur Chest: No wheeze/ rales/ dullness; no accessory muscle use, no nasal flaring, no sternal retractions Abd.: Soft Non-tender, obese Ext: No clubbing cyanosis, edema Neuro:  normal strength Skin: No rashes, warm and dry Psych: normal mood and behavior   Assessment/Plan  OSA on CPAP Compliant with CPAP Benefiting from treatment Plan: You are doing great on your CPAP machine. Continue on CPAP at bedtime. You appear to be benefiting from the treatment Goal is to wear for at least 4-6 hours each night for maximal clinical benefit. Continue to work on weight loss, as the link between excess weight  and sleep apnea is well established.  Do not drive if sleepy. Clean mask, tubing, reservoir and filter once weekly with soapy water. Follow up with Dr. Halford Chessman In 6 months with Dr. Halford Chessman or before as needed.  Please contact office for sooner follow up if symptoms do not improve or worsen or seek emergency care    Magdalen Spatz, NP 05/20/2017  9:56 AM

## 2017-05-21 ENCOUNTER — Encounter (HOSPITAL_COMMUNITY)
Admission: RE | Disposition: A | Discharge status: Home / Self Care | Payer: Self-pay | Source: Ambulatory Visit | Attending: Gynecologic Oncology

## 2017-05-21 ENCOUNTER — Inpatient Hospital Stay (HOSPITAL_COMMUNITY): Payer: BLUE CROSS/BLUE SHIELD | Admitting: Anesthesiology

## 2017-05-21 ENCOUNTER — Ambulatory Visit (HOSPITAL_COMMUNITY)
Admission: RE | Admit: 2017-05-21 | Discharge: 2017-05-21 | Disposition: A | Discharge status: Home / Self Care | Payer: BLUE CROSS/BLUE SHIELD | Source: Ambulatory Visit | Attending: Gynecologic Oncology | Admitting: Gynecologic Oncology

## 2017-05-21 ENCOUNTER — Encounter (HOSPITAL_COMMUNITY): Payer: Self-pay | Admitting: *Deleted

## 2017-05-21 DIAGNOSIS — Z9989 Dependence on other enabling machines and devices: Secondary | ICD-10-CM | POA: Diagnosis not present

## 2017-05-21 DIAGNOSIS — D271 Benign neoplasm of left ovary: Secondary | ICD-10-CM

## 2017-05-21 DIAGNOSIS — E039 Hypothyroidism, unspecified: Secondary | ICD-10-CM | POA: Insufficient documentation

## 2017-05-21 DIAGNOSIS — Z7984 Long term (current) use of oral hypoglycemic drugs: Secondary | ICD-10-CM | POA: Insufficient documentation

## 2017-05-21 DIAGNOSIS — Z79899 Other long term (current) drug therapy: Secondary | ICD-10-CM | POA: Insufficient documentation

## 2017-05-21 DIAGNOSIS — E1165 Type 2 diabetes mellitus with hyperglycemia: Secondary | ICD-10-CM | POA: Insufficient documentation

## 2017-05-21 DIAGNOSIS — Z87891 Personal history of nicotine dependence: Secondary | ICD-10-CM | POA: Insufficient documentation

## 2017-05-21 DIAGNOSIS — R1900 Intra-abdominal and pelvic swelling, mass and lump, unspecified site: Secondary | ICD-10-CM

## 2017-05-21 DIAGNOSIS — I10 Essential (primary) hypertension: Secondary | ICD-10-CM | POA: Insufficient documentation

## 2017-05-21 DIAGNOSIS — K660 Peritoneal adhesions (postprocedural) (postinfection): Secondary | ICD-10-CM | POA: Diagnosis not present

## 2017-05-21 DIAGNOSIS — D171 Benign lipomatous neoplasm of skin and subcutaneous tissue of trunk: Secondary | ICD-10-CM | POA: Diagnosis not present

## 2017-05-21 HISTORY — PX: ROBOTIC ASSISTED BILATERAL SALPINGO OOPHERECTOMY: SHX6078

## 2017-05-21 LAB — GLUCOSE, CAPILLARY
GLUCOSE-CAPILLARY: 116 mg/dL — AB (ref 65–99)
Glucose-Capillary: 101 mg/dL — ABNORMAL HIGH (ref 65–99)

## 2017-05-21 SURGERY — SALPINGO-OOPHORECTOMY, BILATERAL, ROBOT-ASSISTED
Anesthesia: General | Site: Abdomen

## 2017-05-21 MED ORDER — ONDANSETRON HCL 4 MG/2ML IJ SOLN
INTRAMUSCULAR | Status: AC
  Filled 2017-05-21: qty 2

## 2017-05-21 MED ORDER — ALBUMIN HUMAN 5 % IV SOLN
INTRAVENOUS | Status: DC | PRN
  Administered 2017-05-21: 08:00:00 via INTRAVENOUS

## 2017-05-21 MED ORDER — SODIUM CHLORIDE 0.9% FLUSH: 3.0000 mL | Freq: Two times a day (BID) | INTRAVENOUS | Status: DC

## 2017-05-21 MED ORDER — OXYCODONE-ACETAMINOPHEN 5-325 MG PO TABS: 1.0000 | ORAL_TABLET | ORAL | 0 refills | Status: DC | PRN

## 2017-05-21 MED ORDER — MIDAZOLAM HCL 2 MG/2ML IJ SOLN
INTRAMUSCULAR | Status: AC
  Filled 2017-05-21: qty 2

## 2017-05-21 MED ORDER — PROMETHAZINE HCL 25 MG/ML IJ SOLN: 6.2500 mg | INTRAMUSCULAR | Status: DC | PRN

## 2017-05-21 MED ORDER — CEFAZOLIN SODIUM-DEXTROSE 2-4 GM/100ML-% IV SOLN: 2.0000 g | INTRAVENOUS | Status: DC

## 2017-05-21 MED ORDER — PHENYLEPHRINE HCL 10 MG/ML IJ SOLN
INTRAMUSCULAR | Status: DC | PRN
  Administered 2017-05-21 (×2): 20 ug via INTRAVENOUS

## 2017-05-21 MED ORDER — HYDROMORPHONE HCL-NACL 0.5-0.9 MG/ML-% IV SOSY
PREFILLED_SYRINGE | INTRAVENOUS | Status: AC
  Filled 2017-05-21: qty 2

## 2017-05-21 MED ORDER — FENTANYL CITRATE (PF) 100 MCG/2ML IJ SOLN
INTRAMUSCULAR | Status: DC | PRN
  Administered 2017-05-21 (×4): 50 ug via INTRAVENOUS
  Administered 2017-05-21 (×2): 25 ug via INTRAVENOUS

## 2017-05-21 MED ORDER — ACETAMINOPHEN 500 MG PO TABS: 1000.0000 mg | ORAL_TABLET | Freq: Four times a day (QID) | ORAL | Status: DC

## 2017-05-21 MED ORDER — ACETAMINOPHEN 325 MG PO TABS: 650.0000 mg | ORAL_TABLET | ORAL | Status: DC | PRN

## 2017-05-21 MED ORDER — SUCCINYLCHOLINE CHLORIDE 20 MG/ML IJ SOLN
INTRAMUSCULAR | Status: DC | PRN
  Administered 2017-05-21: 120 mg via INTRAVENOUS

## 2017-05-21 MED ORDER — PROPOFOL 10 MG/ML IV BOLUS
INTRAVENOUS | Status: DC | PRN
  Administered 2017-05-21: 175 mg via INTRAVENOUS

## 2017-05-21 MED ORDER — ROCURONIUM BROMIDE 50 MG/5ML IV SOSY
PREFILLED_SYRINGE | INTRAVENOUS | Status: AC
  Filled 2017-05-21: qty 5

## 2017-05-21 MED ORDER — FENTANYL CITRATE (PF) 250 MCG/5ML IJ SOLN
INTRAMUSCULAR | Status: AC
  Filled 2017-05-21: qty 5

## 2017-05-21 MED ORDER — ONDANSETRON HCL 4 MG/2ML IJ SOLN
INTRAMUSCULAR | Status: DC | PRN
  Administered 2017-05-21: 4 mg via INTRAVENOUS

## 2017-05-21 MED ORDER — ACETAMINOPHEN 10 MG/ML IV SOLN
INTRAVENOUS | Status: AC
  Filled 2017-05-21: qty 100

## 2017-05-21 MED ORDER — LACTATED RINGERS IR SOLN
Status: DC | PRN
  Administered 2017-05-21: 1000 mL

## 2017-05-21 MED ORDER — ALBUMIN HUMAN 5 % IV SOLN
INTRAVENOUS | Status: AC
  Filled 2017-05-21: qty 250

## 2017-05-21 MED ORDER — ROCURONIUM BROMIDE 100 MG/10ML IV SOLN
INTRAVENOUS | Status: DC | PRN
  Administered 2017-05-21: 30 mg via INTRAVENOUS
  Administered 2017-05-21: 20 mg via INTRAVENOUS

## 2017-05-21 MED ORDER — LIDOCAINE HCL (CARDIAC) 20 MG/ML IV SOLN
INTRAVENOUS | Status: DC | PRN
  Administered 2017-05-21 (×2): 80 mg via INTRAVENOUS

## 2017-05-21 MED ORDER — DEXAMETHASONE SODIUM PHOSPHATE 10 MG/ML IJ SOLN
INTRAMUSCULAR | Status: AC
  Filled 2017-05-21: qty 1

## 2017-05-21 MED ORDER — HYDROMORPHONE HCL-NACL 0.5-0.9 MG/ML-% IV SOSY
0.2500 mg | PREFILLED_SYRINGE | INTRAVENOUS | Status: DC | PRN
  Administered 2017-05-21: 0.5 mg via INTRAVENOUS

## 2017-05-21 MED ORDER — SODIUM CHLORIDE 0.9 % IV SOLN: 250.0000 mL | INTRAVENOUS | Status: DC | PRN

## 2017-05-21 MED ORDER — STERILE WATER FOR IRRIGATION IR SOLN
Status: DC | PRN
  Administered 2017-05-21: 1000 mL

## 2017-05-21 MED ORDER — LACTATED RINGERS IV SOLN
INTRAVENOUS | Status: DC | PRN
  Administered 2017-05-21 (×2): via INTRAVENOUS

## 2017-05-21 MED ORDER — DEXAMETHASONE SODIUM PHOSPHATE 10 MG/ML IJ SOLN
INTRAMUSCULAR | Status: DC | PRN
  Administered 2017-05-21: 10 mg via INTRAVENOUS

## 2017-05-21 MED ORDER — ACETAMINOPHEN 650 MG RE SUPP
650.0000 mg | RECTAL | Status: DC | PRN
  Filled 2017-05-21: qty 1

## 2017-05-21 MED ORDER — ENOXAPARIN SODIUM 40 MG/0.4ML ~~LOC~~ SOLN
40.0000 mg | SUBCUTANEOUS | Status: AC
  Administered 2017-05-21: 40 mg via SUBCUTANEOUS
  Filled 2017-05-21: qty 0.4

## 2017-05-21 MED ORDER — OXYCODONE HCL 5 MG PO TABS
5.0000 mg | ORAL_TABLET | ORAL | Status: DC | PRN
  Administered 2017-05-21: 5 mg via ORAL
  Filled 2017-05-21: qty 1

## 2017-05-21 MED ORDER — SCOPOLAMINE 1 MG/3DAYS TD PT72
MEDICATED_PATCH | TRANSDERMAL | Status: DC | PRN
  Administered 2017-05-21: 1 via TRANSDERMAL

## 2017-05-21 MED ORDER — SCOPOLAMINE 1 MG/3DAYS TD PT72
MEDICATED_PATCH | TRANSDERMAL | Status: AC
  Filled 2017-05-21: qty 1

## 2017-05-21 MED ORDER — SODIUM CHLORIDE 0.9% FLUSH: 3.0000 mL | INTRAVENOUS | Status: DC | PRN

## 2017-05-21 MED ORDER — SUGAMMADEX SODIUM 200 MG/2ML IV SOLN
INTRAVENOUS | Status: AC
  Filled 2017-05-21: qty 2

## 2017-05-21 MED ORDER — MORPHINE SULFATE (PF) 4 MG/ML IV SOLN: 2.0000 mg | INTRAVENOUS | Status: DC | PRN

## 2017-05-21 MED ORDER — LIDOCAINE 2% (20 MG/ML) 5 ML SYRINGE
INTRAMUSCULAR | Status: AC
  Filled 2017-05-21: qty 5

## 2017-05-21 MED ORDER — CEFAZOLIN SODIUM-DEXTROSE 2-4 GM/100ML-% IV SOLN
INTRAVENOUS | Status: AC
  Filled 2017-05-21: qty 100

## 2017-05-21 MED ORDER — PROPOFOL 10 MG/ML IV BOLUS
INTRAVENOUS | Status: AC
  Filled 2017-05-21: qty 20

## 2017-05-21 MED ORDER — SUGAMMADEX SODIUM 500 MG/5ML IV SOLN
INTRAVENOUS | Status: DC | PRN
  Administered 2017-05-21: 300 mg via INTRAVENOUS

## 2017-05-21 MED ORDER — MIDAZOLAM HCL 5 MG/5ML IJ SOLN
INTRAMUSCULAR | Status: DC | PRN
  Administered 2017-05-21 (×2): 1 mg via INTRAVENOUS

## 2017-05-21 MED FILL — OXYCODONE-ACETAMINOPHEN 5-3: 5-325 | 3 days supply | Qty: 20 | Fill #0

## 2017-05-21 SURGICAL SUPPLY — 80 items
BAG URINE DRAINAGE (UROLOGICAL SUPPLIES) IMPLANT
BENZOIN TINCTURE PRP APPL 2/3 (GAUZE/BANDAGES/DRESSINGS) ×3 IMPLANT
BLADE SURG 15 STRL LF DISP TIS (BLADE) IMPLANT
BLADE SURG 15 STRL SS (BLADE)
CABLE HIGH FREQUENCY MONO STRZ (ELECTRODE) IMPLANT
CATH FOLEY 2WAY SLVR  5CC 16FR (CATHETERS)
CATH FOLEY 2WAY SLVR 5CC 16FR (CATHETERS) IMPLANT
CATH ROBINSON RED A/P 16FR (CATHETERS) IMPLANT
CHLORAPREP W/TINT 26ML (MISCELLANEOUS) IMPLANT
CLEANER TIP ELECTROSURG 2X2 (MISCELLANEOUS) IMPLANT
CONT SPEC 4OZ CLIKSEAL STRL BL (MISCELLANEOUS) IMPLANT
COVER BACK TABLE 60X90IN (DRAPES) ×3 IMPLANT
COVER MAYO STAND STRL (DRAPES) IMPLANT
COVER SURGICAL LIGHT HANDLE (MISCELLANEOUS) IMPLANT
COVER TIP SHEARS 8 DVNC (MISCELLANEOUS) ×2 IMPLANT
COVER TIP SHEARS 8MM DA VINCI (MISCELLANEOUS) ×1
DECANTER SPIKE VIAL GLASS SM (MISCELLANEOUS) IMPLANT
DERMABOND ADVANCED (GAUZE/BANDAGES/DRESSINGS)
DERMABOND ADVANCED .7 DNX12 (GAUZE/BANDAGES/DRESSINGS) IMPLANT
DEVICE TROCAR PUNCTURE CLOSURE (ENDOMECHANICALS) IMPLANT
DRAPE ARM DVNC X/XI (DISPOSABLE) ×8 IMPLANT
DRAPE COLUMN DVNC XI (DISPOSABLE) ×2 IMPLANT
DRAPE DA VINCI XI ARM (DISPOSABLE) ×4
DRAPE DA VINCI XI COLUMN (DISPOSABLE) ×1
DRAPE SHEET LG 3/4 BI-LAMINATE (DRAPES) ×3 IMPLANT
DRAPE SURG IRRIG POUCH 19X23 (DRAPES) ×3 IMPLANT
DRSG TEGADERM 2-3/8X2-3/4 SM (GAUZE/BANDAGES/DRESSINGS) IMPLANT
ELECT PENCIL ROCKER SW 15FT (MISCELLANEOUS) IMPLANT
ELECT REM PT RETURN 15FT ADLT (MISCELLANEOUS) ×3 IMPLANT
GAUZE SPONGE 2X2 8PLY STRL LF (GAUZE/BANDAGES/DRESSINGS) IMPLANT
GAUZE SPONGE 4X4 12PLY STRL (GAUZE/BANDAGES/DRESSINGS) IMPLANT
GAUZE SPONGE 4X4 16PLY XRAY LF (GAUZE/BANDAGES/DRESSINGS) IMPLANT
GLOVE BIO SURGEON STRL SZ 6.5 (GLOVE) ×15 IMPLANT
GLOVE BIO SURGEON STRL SZ7.5 (GLOVE) ×6 IMPLANT
GLOVE BIOGEL PI IND STRL 7.0 (GLOVE) ×4 IMPLANT
GLOVE BIOGEL PI INDICATOR 7.0 (GLOVE) ×2
GOWN STRL REUS W/ TWL LRG LVL3 (GOWN DISPOSABLE) ×2 IMPLANT
GOWN STRL REUS W/ TWL XL LVL3 (GOWN DISPOSABLE) ×4 IMPLANT
GOWN STRL REUS W/TWL LRG LVL3 (GOWN DISPOSABLE) ×1
GOWN STRL REUS W/TWL XL LVL3 (GOWN DISPOSABLE) ×2
HOLDER FOLEY CATH W/STRAP (MISCELLANEOUS) ×3 IMPLANT
IRRIG SUCT STRYKERFLOW 2 WTIP (MISCELLANEOUS) ×3
IRRIGATION SUCT STRKRFLW 2 WTP (MISCELLANEOUS) ×2 IMPLANT
KIT BASIN OR (CUSTOM PROCEDURE TRAY) IMPLANT
MANIPULATOR UTERINE 4.5 ZUMI (MISCELLANEOUS) ×3 IMPLANT
NS IRRIG 1000ML POUR BTL (IV SOLUTION) IMPLANT
OCCLUDER COLPOPNEUMO (BALLOONS) IMPLANT
PACK GENERAL/GYN (CUSTOM PROCEDURE TRAY) IMPLANT
PACK ROBOT GYN CUSTOM WL (TRAY / TRAY PROCEDURE) ×3 IMPLANT
PAD POSITIONING PINK XL (MISCELLANEOUS) ×3 IMPLANT
PORT ACCESS TROCAR AIRSEAL 12 (TROCAR) ×2 IMPLANT
PORT ACCESS TROCAR AIRSEAL 5M (TROCAR) ×1
POUCH SPECIMEN RETRIEVAL 10MM (ENDOMECHANICALS) ×3 IMPLANT
SCISSORS LAP 5X35 DISP (ENDOMECHANICALS) IMPLANT
SEAL CANN UNIV 5-8 DVNC XI (MISCELLANEOUS) ×8 IMPLANT
SEAL XI 5MM-8MM UNIVERSAL (MISCELLANEOUS) ×4
SET TRI-LUMEN FLTR TB AIRSEAL (TUBING) ×3 IMPLANT
SHEET LAVH (DRAPES) IMPLANT
SOLUTION ELECTROLUBE (MISCELLANEOUS) ×3 IMPLANT
SPONGE GAUZE 2X2 STER 10/PKG (GAUZE/BANDAGES/DRESSINGS)
STRIP CLOSURE SKIN 1/2X4 (GAUZE/BANDAGES/DRESSINGS) ×3 IMPLANT
SUT VIC AB 0 CT1 27 (SUTURE) ×1
SUT VIC AB 0 CT1 27XBRD ANTBC (SUTURE) ×2 IMPLANT
SUT VIC AB 3-0 PS2 18 (SUTURE)
SUT VIC AB 3-0 PS2 18XBRD (SUTURE) IMPLANT
SUT VIC AB 4-0 PS2 27 (SUTURE) ×6 IMPLANT
SUT VICRYL 0 UR6 27IN ABS (SUTURE) IMPLANT
SYR 50ML LL SCALE MARK (SYRINGE) ×3 IMPLANT
SYR CONTROL 10ML LL (SYRINGE) IMPLANT
SYRINGE 60CC LL (MISCELLANEOUS) IMPLANT
TOWEL OR 17X26 10 PK STRL BLUE (TOWEL DISPOSABLE) IMPLANT
TOWEL OR NON WOVEN STRL DISP B (DISPOSABLE) ×3 IMPLANT
TRAP SPECIMEN MUCOUS 40CC (MISCELLANEOUS) ×3 IMPLANT
TRAY FOLEY CATH 16FR SILVER (SET/KITS/TRAYS/PACK) ×3 IMPLANT
TROCAR BLADELESS OPT 5 100 (ENDOMECHANICALS) IMPLANT
TROCAR XCEL 12X100 BLDLESS (ENDOMECHANICALS) IMPLANT
TROCAR XCEL BLUNT TIP 100MML (ENDOMECHANICALS) IMPLANT
TUBING NON-CON 1/4 X 20 CONN (TUBING) IMPLANT
UNDERPAD 30X30 (UNDERPADS AND DIAPERS) ×3 IMPLANT
WATER STERILE IRR 1000ML POUR (IV SOLUTION) ×3 IMPLANT

## 2017-05-21 NOTE — Procedures (Signed)
PATIENT: Deborah Henderson DATE OF BIRTH: 1965/11/14 ENCOUNTER DATE: 05/21/2017   Preop Diagnosis: Complex adnexal mass, normal CA-125  Postoperative Diagnosis: Mucinous cyst adenoma  Surgery: Left salpingo-oophorectomy, lysis of omental adhesion  Surgeons:  Imagene Gurney A. Alycia Rossetti, MD; Lahoma Crocker, MD   Anesthesia: General   Estimated blood loss: 25  ml   IVF: 1500 ml, 250 ml albumin  Urine output: 629  ml   Complications: None   Pathology: Abdominal washings, left adnexa  Operative findings: Omental adhesions to anterior abdominal wall and right adnexa. 15 cm multicystic and loculated left adnexal mass with filmy adhesions to the rectosigmoid colon.  Procedure: The patient was identified in the preoperative holding area. Informed consent was signed on the chart. Patient was seen history was reviewed and exam was performed.   The patient was then taken to the operating room and placed in the supine position with SCD hose on. She was then placed in the dorsolithotomy position. Her arms were tucked at her side with appropriate precautions on the gel pad. General anesthesia was then induced without difficulty. Shoulder blocks were then placed in the usual fashion with appropriate precautions. A OG-tube was placed to suction. First timeout was performed to confirm the patient, procedure, antibiotic, allergy status, estimated blood loss and OR time. The perineum was then prepped in the usual fashion with Betadine. A 14 French Foley was inserted into the bladder under sterile conditions. A sterile speculum was placed in the vagina. The cervix was without lesions. The cervix was grasped with a single-tooth tenaculum. The dilator without difficulty. A ZUMI with a small Koe ring was placed without difficulty. The abdomen was then prepped with 2 Chlor prep sponges per protocol.   Patient was then draped after the prep was dried. Second timeout was performed to confirm the above. After again  confirming OG tube placement and it was to suction. A stab-wound was made in left upper quadrant 2 cm below the costal margin on the left in the midclavicular line. A 5 mm optiview port was used to assure intra-abdominal placement. The abdomen was insufflated. At this point all points during the procedure the patient's intra-abdominal pressure was not increased over 15 mm of mercury. The patient developed bradycardia and bigeminy and the pressure was ultimately dropped to 10 mmHg. After insufflation was complete, the patient was placed in deep Trendelenburg position. 25 cm above the pubic symphysis that area was marked the camera port. Bilateral robotic ports were marked 10 cm from the midline incision at approximately 0 angle. Under direct visualization each of the trochars was placed into the abdomen. The small bowel was folded on its mesentery to allow visualization to the pelvis. The 5 mm LUQ port was then converted to a 10/12 port under direct visualization.  After assuring adequate visualization, the robot was then docked in the usual fashion. Under direct visualization the robotic instruments replaced.   The filmy omental adhesions were taken down with monopolar cautery and sharp dissection. The posterior leaf of the broad ligament on her left side was opened. We were no initially able to identify the ureter secondary to visualization and adipose tissue. We cauterized the utero-ovarian and transected the blood supply. This allowed [place] to skeletonize the IP and the ureter was identified on the medial leaf of the broad ligament. A window was made between the IP and the ureter. The IP was coagulated with bipolar cautery and transected.   The ovarian mass was unavoidably ruptured during manipulation and lysis of  the adhesions to the colon. It was drained of serous and slightly mucinous fluid. It was placed ina 10/12 endocatch bag and delivered through the 10/12 LUQ port. 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 Based on pre-operative discussion with the patient, the decision was to not proceed with any additional procedures in the setting of benign disease.  The abdomen and pelvis were copiously irrigated and noted to be hemostatic. The robotic instruments were removed under direct visualization as were the robotic trochars. The pneumoperitoneum was removed. The patient was then taken out of the Trendelenburg position. Using of 0 Vicryl on a UR 6 needle the subcutaneous tissues of the port in the left upper quadrant was reapproximated. The skin was closed using 4-0 Vicryl. Steri-Strips and benzoin were applied. The pneumo occluded balloon was removed from the vagina. The vagina was swabbed and noted to be hemostatic.   All instrument needle and Ray-Tec counts were correct x2. The patient tolerated the procedure well and was taken to the recovery room in stable condition. This is Nancy Marus dictating an operative note on patient [facility]. I discussed the bigeminy with her husband and they will also follow up with her primary care physician regarding this issue. He was not aware of this previously and she has been asymptomatic.

## 2017-05-21 NOTE — Anesthesia Postprocedure Evaluation (Signed)
Anesthesia Post Note  Patient: Deborah Henderson  Procedure(s) Performed: Procedure(s) (LRB): XI ROBOTIC ASSIST  LEFT SALPINGO OOPHORECTOMY, LYSIS OF ADHESIONS (N/A)     Patient location during evaluation: PACU Anesthesia Type: General Level of consciousness: awake and alert Pain management: pain level controlled Vital Signs Assessment: post-procedure vital signs reviewed and stable Respiratory status: spontaneous breathing, nonlabored ventilation, respiratory function stable and patient connected to nasal cannula oxygen Cardiovascular status: blood pressure returned to baseline and stable Postop Assessment: no signs of nausea or vomiting Anesthetic complications: no    Last Vitals:  Vitals:   05/21/17 1118 05/21/17 1130  BP: 120/75 131/76  Pulse: 71 78  Resp: 10 16  Temp: 36.7 C 37.2 C    Last Pain:  Vitals:   05/21/17 1149  TempSrc:   PainSc: 2                  Tiajuana Amass

## 2017-05-21 NOTE — Transfer of Care (Signed)
Immediate Anesthesia Transfer of Care Note  Patient: Deborah Henderson  Procedure(s) Performed: Procedure(s): XI ROBOTIC ASSIST  LEFT SALPINGO OOPHORECTOMY, LYSIS OF ADHESIONS (N/A)  Patient Location: PACU  Anesthesia Type:General  Level of Consciousness: awake, oriented, drowsy, patient cooperative and responds to stimulation  Airway & Oxygen Therapy: Patient Spontanous Breathing and Patient connected to face mask oxygen  Post-op Assessment: Report given to RN, Post -op Vital signs reviewed and stable and Patient moving all extremities  Post vital signs: Reviewed and stable  Last Vitals:  Vitals:   05/21/17 0515  BP: (!) 146/83  Pulse: 87  Resp: 18  Temp: 36.7 C    Last Pain:  Vitals:   05/21/17 0515  TempSrc: Oral      Patients Stated Pain Goal: 4 (50/93/26 7124)  Complications: No apparent anesthesia complications

## 2017-05-21 NOTE — Anesthesia Procedure Notes (Signed)
Procedure Name: Intubation Date/Time: 05/21/2017 8:43 AM Performed by: Suzette Battiest Pre-anesthesia Checklist: Patient identified, Emergency Drugs available, Suction available, Patient being monitored and Timeout performed Patient Re-evaluated:Patient Re-evaluated prior to induction Oxygen Delivery Method: Circle system utilized Preoxygenation: Pre-oxygenation with 100% oxygen Induction Type: IV induction Ventilation: Mask ventilation without difficulty Laryngoscope Size: Mac and 4 Grade View: Grade II Tube type: Oral Number of attempts: 1 Airway Equipment and Method: Stylet Placement Confirmation: ETT inserted through vocal cords under direct vision,  positive ETCO2 and breath sounds checked- equal and bilateral Secured at: 21 cm Tube secured with: Tape Dental Injury: Teeth and Oropharynx as per pre-operative assessment

## 2017-05-21 NOTE — Interval H&P Note (Signed)
History and Physical Interval Note:  05/21/2017 7:25 AM  Deborah Henderson  has presented today for surgery, with the diagnosis of PELVIC MASS  The various methods of treatment have been discussed with the patient and family. After consideration of risks, benefits and other options for treatment, the patient has consented to  Procedure(s): XI ROBOTIC ASSIST UNILATERAL SALPINGO OOPHORECTOMY (N/A) POSSIBLE BILATERAL SALPINGO OOPHORECTOMY POSSIBLE STAGING (Bilateral) as a surgical intervention .  The patient's history has been reviewed, patient examined, no change in status, stable for surgery.  I have reviewed the patient's chart and labs.  Questions were answered to the patient's satisfaction.     [place] A.

## 2017-05-21 NOTE — H&P (View-Only) (Signed)
Follow-up Note: Gyn-Onc  Consult was requested by Dr. Dellis Filbert for the evaluation of Deborah Henderson 51 y.o. female  CC:  Chief Complaint  Patient presents with  . Pelvic Mass    Assessment/Plan:  Deborah Henderson  is a 51 y.o.  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 I believe this mass is likely benign and may not be a cause of her right sided upper abdominal pain (for which I have no good explanation).  Her Hb A1 c is trending down, and therefore I feel she is a better candidate for surgery. I feel that this is a candidate for a minimally invasive approach with robotic LSO, possible BSO possible hysterectomy. I am not recommending BSO at her age unless malignancy is identfiied.   HPI: Deborah Henderson is a 51 year old woman who is seen in consultation at the request of Dr Dellis Filbert for a left sided pelvic mass. The patient relocated from [place] late 2017 after Deborah Henderson.  She began experiencing RUQ pain and underwent an abdominal US on 03/04/17. She has a history of a prior cholecystectomy. It showed no explanation for her pain, but did show a cystic tubular appearing mass in the pelvis measuring 15x5.3x12.2cm. This was confirmed on TVUS which showed a normal uterus, unable to visualize the right ovary.  CA 125 was normal at 20 (ova 1 pending).  She has had a prior lap chole and a prior cesarean section and surgery for left sided ectopic.   She has poorly controlled DM (type II) and has recently been started on Invokana 1 month ago.  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 Measures on the order of 10.7 x 8.1 cm. Maximally 8.4 cm craniocaudal.  She has worked hard with glycemic control, and recheck of HbA1c on  04/22/17 had improved to 6.3.  Current Meds:  Outpatient Encounter Prescriptions as of 04/29/2017  Medication Sig  . ammonium lactate (AMLACTIN) 12 % lotion Apply 1 application topically as needed for dry skin. Apply to area twice daily  . canagliflozin (INVOKANA) 100 MG TABS tablet Take 1 tablet (100 mg total) by mouth daily before breakfast.  . levothyroxine (SYNTHROID, LEVOTHROID) 25 MCG tablet Take 1 tablet (25 mcg total) by mouth daily before breakfast.  . losartan-hydrochlorothiazide (HYZAAR) 50-12.5 MG tablet Take 1 tablet by mouth daily.  . metFORMIN (GLUCOPHAGE) 1000 MG tablet Take 1 tablet (1,000 mg total) by mouth 2 (two) times daily with a meal.  . sertraline (ZOLOFT) 25 MG tablet Take 1 tablet (25 mg total) by mouth daily.   No facility-administered encounter medications on file as of 04/29/2017.     Allergy:  Allergies  Allergen Reactions  . Asa [Aspirin] Swelling and Rash    Social Hx:   Social History   Social History  . Marital status: Married    Spouse name: N/A  . Number of children: N/A  . Years of education: N/A   Occupational History  . Not on file.   Social History Main Topics  . Smoking status: Former Smoker    Quit date: 11/09/1989  . Smokeless tobacco: Never Used     Comment: Quit >20 years ago  . Alcohol use No  . Drug use: No  .  Sexual activity: Yes   Other Topics Concern  . Not on file   Social History Narrative  . No narrative on file    Past Surgical Hx:  Past Surgical History:  Procedure Laterality Date  . CHOLECYSTECTOMY, LAPAROSCOPIC  06/02/2015   [place]  . OVARIAN CYST SURGERY  1988   Left    Past Medical Hx:  Past Medical History:  Diagnosis Date  . Diabetes mellitus ([place])   . Diverticulosis   . Hypertension   . Hypothyroidism     Past Gynecological History:  C/s x2 No LMP recorded. Patient is postmenopausal.  Family Hx:  Family History  Problem Relation Age of Onset  . Cancer Father        colon  . Diabetes  Father   . Cancer Brother        bone   . Diabetes Brother   . Diabetes Brother     Review of Systems:  Constitutional  Feels well,    ENT Normal appearing ears and nares bilaterally Skin/Breast  No rash, sores, jaundice, itching, dryness Cardiovascular  No chest pain, shortness of breath, or edema  Pulmonary  No cough or wheeze.  Gastro Intestinal  No nausea, vomitting, or diarrhoea. No bright red blood per rectum, no abdominal pain, change in bowel movement, or constipation.  Genito Urinary  No frequency, urgency, dysuria, no bleeding Musculo Skeletal  No myalgia, arthralgia, joint swelling or pain  Neurologic  No weakness, numbness, change in gait,  Psychology  No depression, anxiety, insomnia.   Vitals:  Blood pressure 130/87, pulse 80, temperature 98.2 F (36.8 C), resp. rate 20, weight 211 lb 11.2 oz (96 kg), SpO2 98 %.  Physical Exam: WD in NAD Neck  Supple NROM, without any enlargements.  Lymph Node Survey No cervical supraclavicular or inguinal adenopathy Cardiovascular  Pulse normal rate, regularity and rhythm. S1 and S2 normal.  Lungs  Clear to auscultation bilateraly, without wheezes/crackles/rhonchi. Good air movement.  Skin  No rash/lesions/breakdown  Psychiatry  Alert and oriented to person, place, and time  Abdomen  Normoactive bowel sounds, abdomen soft, non-tender and obese without evidence of hernia.  Back No CVA tenderness Genito Urinary  Vulva/vagina: Normal external female genitalia.   No lesions. No discharge or bleeding.  Bladder/urethra:  No lesions or masses, well supported bladder  Vagina: normal  Cervix: Normal appearing, no lesions.  Uterus:  Small, mobile, no parametrial involvement or nodularity.  Adnexa: no palpable masses. Rectal  deferred.  Extremities  No bilateral cyanosis, clubbing or edema.   Deborah Eva, MD  04/29/2017, 1:55 PM

## 2017-05-21 NOTE — Discharge Instructions (Addendum)
You have a nausea patch behind your left ear. Remove it on 05/22/17 around 12 noon. Do not get any ointment on your fingers. Wash skin with paper towel with soap and water.          Anestesia general en los adultos, cuidados posteriores (General Anesthesia, Adult, Care After) Estas indicaciones le proporcionan informacin acerca de cmo deber cuidarse despus del procedimiento. El mdico tambin podr darle instrucciones ms especficas. El tratamiento ha sido planificado segn las prcticas mdicas actuales, pero en algunos casos pueden ocurrir problemas. Comunquese con el mdico si tiene algn problema o dudas despus del procedimiento. QU ESPERAR DESPUS DEL PROCEDIMIENTO Despus del procedimiento, es comn [employer] siguientes sntomas:  Vmitos.  Dolor de Investment banker, operational.  Lentitud mental. Es normal sentir lo siguiente:  Nuseas.  Fro o escalofros.  Somnolencia.  Cansancio.  Dolor o inflamacin, incluso en partes del cuerpo no afectadas por la ciruga. INSTRUCCIONES PARA EL CUIDADO EN EL HOGAR Durante al menos 24horas despus del procedimiento:  No haga lo siguiente: ? Participar en actividades que impliquen posibles cadas o lesiones. ? Conducir vehculos. ? Operar maquinarias pesadas. ? Beber alcohol. ? Tomar somnferos o medicamentos que causen somnolencia. ? Firmar documentos legales ni tomar [employer]. ? Cuidar a nios por su cuenta.  Hacer reposo. Comida y bebida  Si vomita, tome agua, jugo o sopa una vez que pueda beber sin vomitar.  Beba suficiente lquido para Consulting civil engineer orina clara o de color amarillo plido.  Asegrese de no tener nuseas antes de ingerir alimentos slidos.  Siga la dieta recomendada por el mdico. Instrucciones generales  Permanezca con un adulto responsable hasta que est completamente despierto y consciente.  Retome sus actividades normales como se lo haya indicado el mdico. Pregntele al mdico qu actividades  son seguras para usted.  Tome los medicamentos de venta libre y los recetados solamente como se lo haya indicado el mdico.  Si fuma, no lo haga sin supervisin.  Concurra a todas las visitas de control como se lo haya indicado el mdico. Esto es importante. SOLICITE ATENCIN MDICA SI:  Contina con nuseas o vmitos en su casa, y los medicamentos no ayudan.  No puede beber lquidos ni volver a comer.  No puede orinar despus de 8 a 12horas.  Tiene una erupcin cutnea.  Tiene fiebre.  Tiene cada vez ms enrojecimiento en la zona de la ciruga. SOLICITE ATENCIN MDICA DE INMEDIATO SI:  Tiene dificultad para respirar.  Siente dolor en el pecho.  Tiene una hemorragia imprevista.  Siente que tiene un problema potencialmente mortal o urgente. Esta informacin no tiene Marine scientist el consejo del mdico. Asegrese de hacerle al mdico cualquier pregunta que tenga. Document Released: 10/08/2005 Document Revised: 10/29/2014 Document Reviewed: 09/22/2015 Elsevier Interactive Patient Education  2018 [place] unilateral - Cuidados posteriores (Unilateral Salpingo-Oophorectomy, Care After) Siga estas instrucciones durante las prximas semanas. Estas indicaciones le proporcionan informacin general acerca de cmo deber cuidarse despus del procedimiento. El mdico tambin podr darle instrucciones ms especficas. El tratamiento se ha planificado de acuerdo a las prcticas mdicas actuales, pero a veces se producen problemas. Comunquese con el mdico si tiene algn problema o tiene dudas despus del procedimiento. QU ESPERAR DESPUS DEL PROCEDIMIENTO Despus del procedimiento, es tpico tener las siguientes sensaciones:  Dolor abdominal que puede controlarse con medicamentos.  Prdida o hemorragia vaginal.  Estreimiento. INSTRUCCIONES PARA EL CUIDADO EN EL HOGAR   Descanse y duerma lo suficiente.  Tome slo  medicamentos de venta  libre o recetados, segn las indicaciones del mdico. No tome aspirina. Puede ocasionar hemorragias.  Kake y secas. Retire o cambie los apsitos (vendajes) tal como le indic su mdico.  Siga las indicaciones de su mdico con respecto a la dieta.  Beba suficiente lquido para Consulting civil engineer orina clara o de color amarillo plido.  [place] indicaciones del mdico. No levante ningn objeto que sea ms pesado que 5 libras (2.3 kg) hasta que el mdico la autorice.  No conduzca vehculos hasta que el mdico la autorice.  No beba alcohol hasta que el mdico la autorice.  No tenga relaciones sexuales hasta que el mdico la autorice.  Tmese la [employer] veces por da y Chartered certified accountant.  Si est constipada podr:  ? Consultar a su mdico si puede tomar un laxante suave. ? Agregar frutas y salvado a su dieta. ? Beber ms lquidos.  Concurra a las consultas de control con su mdico segn las indicaciones. SOLICITE ATENCIN MDICA SI:   La zona de la incisin est roja, se hincha o Engineer, water.  Le aparece una erupcin cutnea.  Se siente mareada.  Aumenta el dolor y no puede controlarlo con Conservation officer, nature.  Tiene dolor, enrojecimiento o hinchazn en la zona en la que fue colocada la va intravenosa. SOLICITE ATENCIN MDICA DE INMEDIATO SI:  Tiene fiebre.  Presenta un dolor abdominal cada vez ms intenso.  Tiene pus en la zona de la incisin, o la incisin se abre.  Advierte un olor ftido que proviene de la herida o del vendaje.  Tiene una hemorragia vaginal abundante.  Tiene Higher education careers adviser (nuseas).  Siente dolor en el pecho o en las piernas.  Siente dolor al [employer].  Le falta el aire.  Se desmaya. Esta informacin no tiene Marine scientist el consejo del mdico. Asegrese de hacerle al mdico cualquier pregunta que tenga. Document Released: 08/05/2009 Document Revised: 07/29/2013 Elsevier  Interactive Patient Education  2017 [employer].

## 2017-05-22 ENCOUNTER — Telehealth: Payer: Self-pay | Admitting: Gynecologic Oncology

## 2017-05-22 ENCOUNTER — Encounter (HOSPITAL_COMMUNITY): Payer: Self-pay | Admitting: Gynecologic Oncology

## 2017-05-22 NOTE — Telephone Encounter (Signed)
The patient has been today regarding pathology. Her pathology was unremarkable. He is very pleased. She's upstairs resting that has been awake all day and moving around eating a regular diet. They have no concerns. They'll call if they have any issues prior to her postop visit. PG

## 2017-05-22 NOTE — Op Note (Signed)
PATIENT: Deborah Henderson DATE OF BIRTH: 1966-09-19 ENCOUNTER DATE: 05/21/2017   Preop Diagnosis: Complex adnexal mass, normal CA-125  Postoperative Diagnosis: Mucinous cyst adenoma  Surgery: Left salpingo-oophorectomy, lysis of omental adhesion  Surgeons:  Imagene Gurney A. Alycia Rossetti, MD; Lahoma Crocker, MD   Anesthesia: General   Estimated blood loss: 25  ml   IVF: 1500 ml, 250 ml albumin  Urine output: 604  ml   Complications: None   Pathology: Abdominal washings, left adnexa  Operative findings: Omental adhesions to anterior abdominal wall and right adnexa. 15 cm multicystic and loculated left adnexal mass with filmy adhesions to the rectosigmoid colon.  Procedure: The patient was identified in the preoperative holding area. Informed consent was signed on the chart. Patient was seen history was reviewed and exam was performed.   The patient was then taken to the operating room and placed in the supine position with SCD hose on. She was then placed in the dorsolithotomy position. Her arms were tucked at her side with appropriate precautions on the gel pad. General anesthesia was then induced without difficulty. Shoulder blocks were then placed in the usual fashion with appropriate precautions. A OG-tube was placed to suction. First timeout was performed to confirm the patient, procedure, antibiotic, allergy status, estimated blood loss and OR time. The perineum was then prepped in the usual fashion with Betadine. A 14 French Foley was inserted into the bladder under sterile conditions. A sterile speculum was placed in the vagina. The cervix was without lesions. The cervix was grasped with a single-tooth tenaculum. The dilator without difficulty. A ZUMI with a small Koe ring was placed without difficulty. The abdomen was then prepped with 2 Chlor prep sponges per protocol.   Patient was then draped after the prep was dried. Second timeout was performed to confirm the above.  After again confirming OG tube placement and it was to suction. A stab-wound was made in left upper quadrant 2 cm below the costal margin on the left in the midclavicular line. A 5 mm optiview port was used to assure intra-abdominal placement. The abdomen was insufflated. At this point all points during the procedure the patient's intra-abdominal pressure was not increased over 15 mm of mercury. The patient developed bradycardia and bigeminy and the pressure was ultimately dropped to 10 mmHg. After insufflation was complete, the patient was placed in deep Trendelenburg position. 25 cm above the pubic symphysis that area was marked the camera port. Bilateral robotic ports were marked 10 cm from the midline incision at approximately 0 angle. Under direct visualization each of the trochars was placed into the abdomen. The small bowel was folded on its mesentery to allow visualization to the pelvis. The 5 mm LUQ port was then converted to a 10/12 port under direct visualization.  After assuring adequate visualization, the robot was then docked in the usual fashion. Under direct visualization the robotic instruments replaced.   The filmy omental adhesions were taken down with monopolar cautery and sharp dissection. The posterior leaf of the broad ligament on her left side was opened. We were no initially able to identify the ureter secondary to visualization and adipose tissue. We cauterized the utero-ovarian and transected the blood supply. This allowed [place] to skeletonize the IP and the ureter was identified on the medial leaf of the broad ligament. A window was made between the IP and the ureter. The IP was coagulated with bipolar cautery and transected.   The ovarian mass was unavoidably ruptured during manipulation and lysis of  the adhesions to the colon. It was drained of serous and slightly mucinous fluid. It was placed ina 10/12 endocatch bag and delivered through the 10/12 LUQ port. 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 Based on pre-operative discussion with the patient, the decision was to not proceed with any additional procedures in the setting of benign disease.  The abdomen and pelvis were copiously irrigated and noted to be hemostatic. The robotic instruments were removed under direct visualization as were the robotic trochars. The pneumoperitoneum was removed. The patient was then taken out of the Trendelenburg position. Using of 0 Vicryl on a UR 6 needle the subcutaneous tissues of the port in the left upper quadrant was reapproximated. The skin was closed using 4-0 Vicryl. Steri-Strips and benzoin were applied. The pneumo occluded balloon was removed from the vagina. The vagina was swabbed and noted to be hemostatic.   All instrument needle and [employer] counts were correct x2. The patient tolerated the procedure well and was taken to the recovery room in stable condition. This is Deborah Henderson dictating an operative note on patient [facility]. I discussed the bigeminy with her husband and they will also follow up with her primary care physician regarding this issue. He was not aware of this previously and she has been asymptomatic.

## 2017-06-11 NOTE — Progress Notes (Signed)
Follow-up Note: Gyn-Onc  Consult was requested by Dr. Dellis Filbert for the evaluation of Deborah Henderson 51 y.o. female  CC:  Chief Complaint  Patient presents with  . Post-operative state  . pelvic mass    Assessment/Plan:  Deborah Henderson  is a 51 y.o.  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 Unfortunately her pathology was consistent with a mucinous cystadenoma. She's doing very well postoperatively. A prescription for Diflucan was sent for her pharmacy for her yeast infection. She'll address this issue with her primary care physician As we discussed that her invokana can increase the risk of recurrent vaginal yeast infections. She will be released from our clinic. She'll continue to follow up with Dr. Dellis Filbert for her gynecologic needs.  It was a pleasure to participate in the care of this very pleasant patient.   HPI: Deborah Henderson is a 51 year old woman who is seen in consultation at the request of Dr Dellis Filbert for a left sided pelvic mass. The patient relocated from [place] late 2017 after [name].  She began experiencing RUQ pain and underwent an abdominal US on 03/04/17. She has a history of a prior cholecystectomy. It showed no explanation for her pain, but did show a cystic tubular appearing mass in the pelvis measuring 15x5.3x12.2cm. This was confirmed on TVUS which showed a normal uterus, unable to visualize the right ovary.  CA 125 was normal at 20 (ova 1 pending).  She has poorly controlled DM (type II) and has recently been started on Invokana about 2 months ago.  Interval Hx:  On 04/04/17 she underwent a CT abd/pelvis which showed: Multi septated cystic lesion is favored to arise from the left ovary, but is relatively centrally positioned, extending into the upper right pelvis. Measures on the order of 10.7 x 8.1 cm. Maximally 8.4 cm craniocaudal.  She has worked hard with  glycemic control, and recheck of HbA1c on 04/22/17 had improved to 6.3.  ENCOUNTER DATE: 05/21/2017 Surgery: Leftsalpingo-oophorectomy, lysis of omental adhesion Pathology: Abdominal washings, left adnexa Operative findings: Omental adhesions to anterior abdominal wall and right adnexa. 15 cm multicystic and loculated left adnexal mass with filmy adhesions to the rectosigmoid colon.  Diagnosis Adnexa - ovary +/- tube, neoplastic, left adnexa - BENIGN MUCINOUS CYSTADENOMA.  She comes in today for her post-op check. She's overall doing quite well. She has another vaginal yeast infection. Previously she was treated successfully with Diflucan. I discussed with her that in the, can be associated with an increased risk of vaginal yeast infections. Her sugars are usually about 100. She's lost 8 pounds. She'll discuss this with her primary physician. She's otherwise doing well. She is very pleased with her pathology and how she did after the surgery.  Current Meds:  Outpatient Encounter Prescriptions as of 06/12/2017  Medication Sig  . ammonium lactate (AMLACTIN) 12 % lotion Apply 1 application topically as needed for dry skin. Apply to area twice daily (Patient taking differently: Apply 1 application topically 2 (two) times daily as needed for dry skin. Pt applies to both legs.)  . canagliflozin (INVOKANA) 100 MG TABS tablet Take 1 tablet (100 mg total) by mouth daily before breakfast.  . levothyroxine (SYNTHROID, LEVOTHROID) 25 MCG tablet Take 1 tablet (25 mcg total) by mouth daily before breakfast.  . losartan-hydrochlorothiazide (HYZAAR) 50-12.5 MG tablet Take 1 tablet by mouth daily.  . metFORMIN (GLUCOPHAGE) 1000 MG tablet  Take 1 tablet (1,000 mg total) by mouth 2 (two) times daily with a meal.  . oxyCODONE-acetaminophen (PERCOCET/ROXICET) 5-325 MG tablet Take 1-2 tablets by mouth every 4 (four) hours as needed for severe pain.   No facility-administered encounter medications on file as of  06/12/2017.     Allergy:  Allergies  Allergen Reactions  . Asa [Aspirin] Anaphylaxis and Rash    Social Hx:   Social History   Social History  . Marital status: Married    Spouse name: N/A  . Number of children: N/A  . Years of education: N/A   Occupational History  . Not on file.   Social History Main Topics  . Smoking status: Former Smoker    Quit date: 11/09/1989  . Smokeless tobacco: Never Used     Comment: Quit >20 years ago  . Alcohol use No  . Drug use: No  . Sexual activity: Yes   Other Topics Concern  . Not on file   Social History Narrative  . No narrative on file    Past Surgical Hx:  Past Surgical History:  Procedure Laterality Date  . CESAREAN SECTION     x2  . CHOLECYSTECTOMY    . CHOLECYSTECTOMY, LAPAROSCOPIC  06/02/2015   [place]  . ECTOPIC PREGNANCY SURGERY    . OVARIAN CYST SURGERY  1988   Left  . ROBOTIC ASSISTED BILATERAL SALPINGO OOPHERECTOMY N/A 05/21/2017   Procedure: XI ROBOTIC ASSIST  LEFT SALPINGO OOPHORECTOMY, LYSIS OF ADHESIONS;  Surgeon: Nancy Marus, MD;  Location: [facility];  Service: Gynecology;  Laterality: N/A;    Past Medical Hx:  Past Medical History:  Diagnosis Date  . Diabetes mellitus ([place])    type 2  . Diverticulosis   . Hypertension   . Hypothyroidism     Past Gynecological History:  C/s x2 No LMP recorded. Patient is postmenopausal.  Family Hx:  Family History  Problem Relation Age of Onset  . Cancer Father        colon  . Diabetes Father   . Cancer Brother        bone   . Diabetes Brother   . Diabetes Brother     Vitals:  Blood pressure 127/81, pulse 74, temperature 98.1 F (36.7 C), temperature source Oral, resp. rate 16, weight 208 lb 4.8 oz (94.5 kg), SpO2 98 %.  Physical Exam: WD in NAD  Abdomen: Well-healing laparoscopic incisions. Abdomen is soft and nontender. There is no incisional hernias. Exam is somewhat limited by habitus.   Cendy Oconnor A., MD  06/12/2017, 11:24 AM

## 2017-06-12 ENCOUNTER — Other Ambulatory Visit: Payer: Self-pay | Admitting: Gynecologic Oncology

## 2017-06-12 ENCOUNTER — Encounter: Payer: Self-pay | Admitting: Gynecologic Oncology

## 2017-06-12 ENCOUNTER — Ambulatory Visit: Payer: BLUE CROSS/BLUE SHIELD | Attending: Gynecologic Oncology | Admitting: Gynecologic Oncology

## 2017-06-12 VITALS — BP 127/81 | HR 74 | Temp 98.1°F | Resp 16 | Wt 208.3 lb

## 2017-06-12 DIAGNOSIS — B373 Candidiasis of vulva and vagina: Secondary | ICD-10-CM | POA: Diagnosis not present

## 2017-06-12 DIAGNOSIS — D367 Benign neoplasm of other specified sites: Secondary | ICD-10-CM | POA: Insufficient documentation

## 2017-06-12 DIAGNOSIS — I10 Essential (primary) hypertension: Secondary | ICD-10-CM | POA: Diagnosis not present

## 2017-06-12 DIAGNOSIS — Z7984 Long term (current) use of oral hypoglycemic drugs: Secondary | ICD-10-CM | POA: Insufficient documentation

## 2017-06-12 DIAGNOSIS — E1165 Type 2 diabetes mellitus with hyperglycemia: Secondary | ICD-10-CM | POA: Insufficient documentation

## 2017-06-12 DIAGNOSIS — E039 Hypothyroidism, unspecified: Secondary | ICD-10-CM | POA: Diagnosis not present

## 2017-06-12 DIAGNOSIS — D287 Benign neoplasm of other specified female genital organs: Secondary | ICD-10-CM

## 2017-06-12 DIAGNOSIS — Z9889 Other specified postprocedural states: Secondary | ICD-10-CM | POA: Diagnosis present

## 2017-06-12 DIAGNOSIS — Z87891 Personal history of nicotine dependence: Secondary | ICD-10-CM | POA: Insufficient documentation

## 2017-06-12 DIAGNOSIS — R1900 Intra-abdominal and pelvic swelling, mass and lump, unspecified site: Secondary | ICD-10-CM | POA: Diagnosis present

## 2017-06-12 DIAGNOSIS — Z9049 Acquired absence of other specified parts of digestive tract: Secondary | ICD-10-CM | POA: Diagnosis not present

## 2017-06-12 DIAGNOSIS — B3731 Acute candidiasis of vulva and vagina: Secondary | ICD-10-CM

## 2017-06-12 MED ORDER — FLUCONAZOLE 200 MG PO TABS: 200.0000 mg | ORAL_TABLET | Freq: Every day | ORAL | 2 refills | Status: DC

## 2017-06-12 MED FILL — FLUCONAZOLE 200 MG TABLET: 200 | 3 days supply | Qty: 3 | Fill #0

## 2017-06-12 NOTE — Patient Instructions (Signed)
Follow up with Dr. Dellis Filbert and call us with any questions.  Fluconazole tablets What is this medicine? FLUCONAZOLE (floo KON na zole) is an antifungal medicine. It is used to treat certain kinds of fungal or yeast infections. This medicine may be used for other purposes; ask your health care provider or pharmacist if you have questions. COMMON BRAND NAME(S): Diflucan What should I tell my health care provider before I take this medicine? They need to know if you have any of these conditions: -history of irregular heart beat -kidney disease -an unusual or allergic reaction to fluconazole, other azole antifungals, medicines, foods, dyes, or preservatives -pregnant or trying to get pregnant -breast-feeding How should I use this medicine? Take this medicine by mouth. Follow the directions on the prescription label. Do not take your medicine more often than directed. Talk to your pediatrician regarding the use of this medicine in children. Special care may be needed. This medicine has been used in children as young as 1 months of age. Overdosage: If you think you have taken too much of this medicine contact a poison control center or emergency room at once. NOTE: This medicine is only for you. Do not share this medicine with others. What if I miss a dose? If you miss a dose, take it as soon as you can. If it is almost time for your next dose, take only that dose. Do not take double or extra doses. What may interact with this medicine? Do not take this medicine with any of the following medications: -astemizole -certain medicines for irregular heart beat like dofetilide, dronedarone, quinidine -cisapride -erythromycin -lomitapide -other medicines that prolong the QT interval (cause an abnormal heart rhythm) -pimozide -terfenadine -thioridazine -tolvaptan -ziprasidone This medicine may also interact with the following medications: -antiviral medicines for HIV or AIDS -birth control  pills -certain antibiotics like rifabutin, rifampin -certain medicines for blood pressure like amlodipine, isradipine, felodipine, hydrochlorothiazide, losartan, nifedipine -certain medicines for cancer like cyclophosphamide, vinblastine, vincristine -certain medicines for cholesterol like atorvastatin, lovastatin, fluvastatin, simvastatin -certain medicines for depression, anxiety, or psychotic disturbances like amitriptyline, midazolam, nortriptyline, triazolam -certain medicines for diabetes like glipizide, glyburide, tolbutamide -certain medicines for pain like alfentanil, fentanyl, methadone -certain medicines for seizures like carbamazepine, phenytoin -certain medicines that treat or prevent blood clots like warfarin -halofantrine -medicines that lower your chance of fighting infection like cyclosporine, prednisone, tacrolimus -NSAIDS, medicines for pain and inflammation, like celecoxib, diclofenac, flurbiprofen, ibuprofen, meloxicam, naproxen -other medicines for fungal infections -sirolimus -theophylline -tofacitinib This list may not describe all possible interactions. Give your health care provider a list of all the medicines, herbs, non-prescription drugs, or dietary supplements you use. Also tell them if you smoke, drink alcohol, or use illegal drugs. Some items may interact with your medicine. What should I watch for while using this medicine? Visit your doctor or health care professional for regular checkups. If you are taking this medicine for a long time you may need blood work. Tell your doctor if your symptoms do not improve. Some fungal infections need many weeks or months of treatment to cure. Alcohol can increase possible damage to your liver. Avoid alcoholic drinks. If you have a vaginal infection, do not have sex until you have finished your treatment. You can wear a sanitary napkin. Do not use tampons. Wear freshly washed cotton, not synthetic, panties. What side effects  may I notice from receiving this medicine? Side effects that you should report to your doctor or health care professional as soon as  possible: -allergic reactions like skin rash or itching, hives, swelling of the lips, mouth, tongue, or throat -dark urine -feeling dizzy or faint -irregular heartbeat or chest pain -redness, blistering, peeling or loosening of the skin, including inside the mouth -trouble breathing -unusual bruising or bleeding -vomiting -yellowing of the eyes or skin Side effects that usually do not require medical attention (report to your doctor or health care professional if they continue or are bothersome): -changes in how food tastes -diarrhea -headache -stomach upset or nausea This list may not describe all possible side effects. Call your doctor for medical advice about side effects. You may report side effects to FDA at 1-800-FDA-1088. Where should I keep my medicine? Keep out of the reach of children. Store at room temperature below 30 degrees C (86 degrees F). Throw away any medicine after the expiration date. NOTE: This sheet is a summary. It may not cover all possible information. If you have questions about this medicine, talk to your doctor, pharmacist, or health care provider.  2018 Elsevier/Gold Standard (2013-05-16 19:37:38)

## 2017-06-17 MED FILL — LOSARTAN POTASSIUM-HCTZ 50-: 50-12.5 | 30 days supply | Qty: 30 | Fill #3

## 2017-06-17 MED FILL — LEVOTHYROXINE 25 MCG TABLET: 25 | 30 days supply | Qty: 30 | Fill #3

## 2017-07-17 ENCOUNTER — Ambulatory Visit (INDEPENDENT_AMBULATORY_CARE_PROVIDER_SITE_OTHER): Payer: BLUE CROSS/BLUE SHIELD | Admitting: Medical

## 2017-07-17 ENCOUNTER — Encounter: Payer: Self-pay | Admitting: Medical

## 2017-07-17 VITALS — BP 127/79 | HR 72 | Temp 98.3°F | Resp 16 | Ht 64.0 in | Wt 215.0 lb

## 2017-07-17 DIAGNOSIS — E119 Type 2 diabetes mellitus without complications: Secondary | ICD-10-CM

## 2017-07-17 DIAGNOSIS — I10 Essential (primary) hypertension: Secondary | ICD-10-CM

## 2017-07-17 DIAGNOSIS — F419 Anxiety disorder, unspecified: Secondary | ICD-10-CM

## 2017-07-17 DIAGNOSIS — F329 Major depressive disorder, single episode, unspecified: Secondary | ICD-10-CM

## 2017-07-17 DIAGNOSIS — E039 Hypothyroidism, unspecified: Secondary | ICD-10-CM | POA: Diagnosis not present

## 2017-07-17 DIAGNOSIS — Z23 Encounter for immunization: Secondary | ICD-10-CM | POA: Diagnosis not present

## 2017-07-17 DIAGNOSIS — F32A Depression, unspecified: Secondary | ICD-10-CM

## 2017-07-17 MED ORDER — METFORMIN HCL 1000 MG PO TABS: 1000.0000 mg | ORAL_TABLET | Freq: Two times a day (BID) | ORAL | 3 refills | Status: DC

## 2017-07-17 MED ORDER — LOSARTAN POTASSIUM-HCTZ 50-12.5 MG PO TABS: 1.0000 | ORAL_TABLET | Freq: Every day | ORAL | 1 refills | Status: DC

## 2017-07-17 MED ORDER — LEVOTHYROXINE SODIUM 25 MCG PO TABS: 25.0000 ug | ORAL_TABLET | Freq: Every day | ORAL | 3 refills | Status: DC

## 2017-07-17 MED FILL — LEVOTHYROXINE 25 MCG TABLET: 25 | 30 days supply | Qty: 30 | Fill #0

## 2017-07-17 MED FILL — LOSARTAN POTASSIUM-HCTZ 50-: 50-12.5 | 90 days supply | Qty: 90 | Fill #0

## 2017-07-17 NOTE — Patient Instructions (Addendum)
Your blood pressure is well controlled today. I refilled your losartan. Continue to check your blood pressures and notify us if trend is upward or exceeding 140/90.  For your reported low pulse during surgery, I want you to note your pulse readings when you check your blood pressure. If you get consistent pulse lower than 60 please notify us. Your EKG this summer was normal and did not show slow heart rate.  For low thyroid, refilled your thyroid medication and will get TSH early October.  For your anxiety, continue with the sertraline. This appears to be working well.  For diabetes, continue low sugar diet and try to get daily exercise. Continue metformin. We want use Invokana anymore since she reported yeast infections side effect. On repeat of A1c will see if we need to add other type medication to your regimen.  Follow-up date will be 3 months from your lab draw or as needed.

## 2017-07-17 NOTE — Progress Notes (Signed)
Subjective:    Patient ID: Deborah Henderson, female    DOB: 10-Feb-1966, 51 y.o.   MRN: [id number]  HPI  Pt in for follow up.  Pt states that invokana seemed to cause yeast infection. She stopped invokana. But continues metformin.  Pt last a1-c was 6.5. Pt has glucometer but has not been checking.   Pt updates me that during surgery she had low pulse per surgeon. But I don't see low pulse in epic. May 10, 2017 ekg normal. No bradychardia on prior visits. No cardiac symptoms.  Pt has low thyroid. On medication for. Last check tsh may 2018.  Pt states she is on sertraline. Pt has anxiety and controlled with sertraline. Doing well with sertraline 25 mg.   Pt bp well controlled. No cardiac or neurologic signs or symptoms.     Review of Systems  Constitutional: Negative for chills, fatigue and fever.  Respiratory: Negative for cough, chest tightness, shortness of breath and wheezing.   Cardiovascular: Negative for chest pain and palpitations.  Gastrointestinal: Negative for abdominal pain, blood in stool, constipation, diarrhea, nausea and vomiting.  Endocrine: Negative for polydipsia, polyphagia and polyuria.  Genitourinary: Negative for difficulty urinating, dysuria, flank pain and frequency.  Musculoskeletal: Negative for arthralgias, back pain and neck stiffness.  Skin: Negative for rash.  Neurological: Negative for dizziness, seizures, syncope, weakness, numbness and headaches.  Hematological: Negative for adenopathy. Does not bruise/bleed easily.  Psychiatric/Behavioral: Negative for behavioral problems, confusion, dysphoric mood, self-injury and sleep disturbance. The patient is nervous/anxious.    Past Medical History:  Diagnosis Date  . Diabetes mellitus ([place])    type 2  . Diverticulosis   . Hypertension   . Hypothyroidism      Social History   Social History  . Marital status: Married    Spouse name: N/A  . Number of children: N/A  . Years of education: N/A     Occupational History  . Not on file.   Social History Main Topics  . Smoking status: Former Smoker    Quit date: 11/09/1989  . Smokeless tobacco: Never Used     Comment: Quit >20 years ago  . Alcohol use No  . Drug use: No  . Sexual activity: Yes   Other Topics Concern  . Not on file   Social History Narrative  . No narrative on file    Past Surgical History:  Procedure Laterality Date  . CESAREAN SECTION     x2  . CHOLECYSTECTOMY    . CHOLECYSTECTOMY, LAPAROSCOPIC  06/02/2015   [place]  . ECTOPIC PREGNANCY SURGERY    . OVARIAN CYST SURGERY  1988   Left  . ROBOTIC ASSISTED BILATERAL SALPINGO OOPHERECTOMY N/A 05/21/2017   Procedure: XI ROBOTIC ASSIST  LEFT SALPINGO OOPHORECTOMY, LYSIS OF ADHESIONS;  Surgeon: Deborah Marus, MD;  Location: [facility];  Service: Gynecology;  Laterality: N/A;    Family History  Problem Relation Age of Onset  . Cancer Father        colon  . Diabetes Father   . Cancer Brother        bone   . Diabetes Brother   . Diabetes Brother     Allergies  Allergen Reactions  . Asa [Aspirin] Anaphylaxis and Rash    Current Outpatient Prescriptions on File Prior to Visit  Medication Sig Dispense Refill  . ammonium lactate (AMLACTIN) 12 % lotion Apply 1 application topically as needed for dry skin. Apply to area twice daily (Patient taking differently: Apply  1 application topically 2 (two) times daily as needed for dry skin. Pt applies to both legs.) 225 g 1  . fluconazole (DIFLUCAN) 200 MG tablet Take 1 tablet (200 mg total) by mouth daily. 3 tablet 2  . levothyroxine (SYNTHROID, LEVOTHROID) 25 MCG tablet Take 1 tablet (25 mcg total) by mouth daily before breakfast. 30 tablet 3  . losartan-hydrochlorothiazide (HYZAAR) 50-12.5 MG tablet Take 1 tablet by mouth daily. 30 tablet 3  . metFORMIN (GLUCOPHAGE) 1000 MG tablet Take 1 tablet (1,000 mg total) by mouth 2 (two) times daily with a meal. 180 tablet 3  . oxyCODONE-acetaminophen (PERCOCET/ROXICET)  5-325 MG tablet Take 1-2 tablets by mouth every 4 (four) hours as needed for severe pain. 20 tablet 0   No current facility-administered medications on file prior to visit.     BP 127/79   Pulse 72   Temp 98.3 F (36.8 C) (Oral)   Resp 16   Ht 5\' 4"  (1.626 m)   Wt 215 lb (97.5 kg)   SpO2 99%   BMI 36.90 kg/m       Objective:   Physical Exam  General Mental Status- Alert. General Appearance- Not in acute distress.   Skin General: Color- Normal Color. Moisture- Normal Moisture.  Neck Carotid Arteries- Normal color. Moisture- Normal Moisture. No carotid bruits. No JVD.  Chest and Lung Exam Auscultation: Breath Sounds:-Normal.  Cardiovascular Auscultation:Rythm- Regular.  urmurs & Other Heart Sounds:Auscultation of the heart reveals- No Murmurs.  Abdomen Inspection:-Inspeection Normal. Palpation/Percussion:Note:No mass. Palpation and Percussion of the abdomen reveal- Non Tender, Non Distended + BS, no rebound or guarding.  Neurologic Cranial Nerve exam:- CN III-XII intact(No nystagmus), symmetric smile. Strength:- 5/5 equal and symmetric strength both upper and lower extremities.      Assessment & Plan:  Your blood pressure is well controlled today. I refilled your losartan. Continue to check your blood pressures and notify us if trend is upward or exceeding 140/90.  For your reported low pulse during surgery, I want you to note your pulse readings when you check your blood pressure. If you get consistent pulse lower than 60 please notify us. Your EKG this summer was normal and did not show slow heart rate.  For low thyroid, refilled your thyroid medication and will get TSH early October.  For your anxiety, continue with the sertraline. This appears to be working well.  For diabetes, continue low sugar diet and try to get daily exercise. Continue metformin. We want use Invokana anymore since she reported yeast infections side effect. On repeat of A1c will see if  we need to add other type medication to your regimen.  Follow-up date will be 3 months from your lab draw or as needed.  Deborah Henderson, Deborah Miller, PA-C

## 2017-07-22 MED FILL — metFORMIN HCL 1000 MG TABS: 1000 | 90 days supply | Qty: 180 | Fill #0

## 2017-07-29 ENCOUNTER — Other Ambulatory Visit (INDEPENDENT_AMBULATORY_CARE_PROVIDER_SITE_OTHER): Payer: BLUE CROSS/BLUE SHIELD

## 2017-07-29 DIAGNOSIS — I10 Essential (primary) hypertension: Secondary | ICD-10-CM | POA: Diagnosis not present

## 2017-07-29 DIAGNOSIS — E119 Type 2 diabetes mellitus without complications: Secondary | ICD-10-CM

## 2017-07-29 DIAGNOSIS — E039 Hypothyroidism, unspecified: Secondary | ICD-10-CM

## 2017-07-29 LAB — LIPID PANEL
CHOL/HDL RATIO: 4
Cholesterol: 150 mg/dL (ref 0–200)
HDL: 35.3 mg/dL — AB (ref >39.00)
LDL Cholesterol: 93 mg/dL (ref 0–99)
NONHDL: 114.25
TRIGLYCERIDES: 107 mg/dL (ref 0.0–149.0)
VLDL: 21.4 mg/dL (ref 0.0–40.0)

## 2017-07-29 LAB — COMPREHENSIVE METABOLIC PANEL
ALK PHOS: 76 U/L (ref 39–117)
ALT: 59 U/L — AB (ref 0–35)
AST: 34 U/L (ref 0–37)
Albumin: 4 g/dL (ref 3.5–5.2)
BILIRUBIN TOTAL: 0.4 mg/dL (ref 0.2–1.2)
BUN: 14 mg/dL (ref 6–23)
CALCIUM: 9.3 mg/dL (ref 8.4–10.5)
CO2: 29 mEq/L (ref 19–32)
Chloride: 102 mEq/L (ref 96–112)
Creatinine, Ser: 0.83 mg/dL (ref 0.40–1.20)
GFR: 77.15 mL/min (ref >60.00)
GLUCOSE: 172 mg/dL — AB (ref 70–99)
POTASSIUM: 4.2 meq/L (ref 3.5–5.1)
Sodium: 138 mEq/L (ref 135–145)
TOTAL PROTEIN: 7.2 g/dL (ref 6.0–8.3)

## 2017-07-29 LAB — TSH: TSH: 2.9 u[IU]/mL (ref 0.35–4.50)

## 2017-07-29 LAB — HEMOGLOBIN A1C: HEMOGLOBIN A1C: 7.1 % — AB (ref 4.6–6.5)

## 2017-07-30 ENCOUNTER — Telehealth: Payer: Self-pay | Admitting: Medical

## 2017-07-30 MED ORDER — SAXAGLIPTIN HCL 5 MG PO TABS: 5.0000 mg | ORAL_TABLET | Freq: Every day | ORAL | 3 refills | Status: DC

## 2017-07-30 MED FILL — ONGLYZA 5 MG TABLET: 5 | 30 days supply | Qty: 30 | Fill #0

## 2017-07-30 NOTE — Telephone Encounter (Signed)
Rx of onglyza sent to pharmacy.Notify pt. Also have her follow up in 3 months.

## 2017-07-31 NOTE — Telephone Encounter (Signed)
LVM on pt cell phone regarding about rx sent to pharmacy and to call our office to schedule an appt.

## 2017-08-01 ENCOUNTER — Telehealth: Payer: Self-pay | Admitting: Medical

## 2017-08-01 NOTE — Telephone Encounter (Signed)
-----   Message from Mackie Pai, PA-C sent at 07/30/2017  4:05 PM EDT ----- Pt lipid panel looks stable. hdl is still low. Other lipid markers look good. Exercise will increase hdl. Liver enzymes are better/lower. Kidney function is stable. Thyroid study is normal. A1c/3 month sugar average  increased from 6.5 to 7.1. I will rx onglyza in place of her invokana. Will send onglyza to her pharmacy.

## 2017-08-01 NOTE — Telephone Encounter (Signed)
Pt was informed the below and understood, pt stated will pick up rx tomorrow.

## 2017-08-29 MED FILL — LEVOTHYROXINE 25 MCG TABLET: 25 | 30 days supply | Qty: 30 | Fill #1

## 2017-08-29 MED FILL — SERTRALINE HCL 25 MG TABLET: 25 | 30 days supply | Qty: 30 | Fill #1

## 2017-08-29 MED FILL — ONGLYZA 5 MG TABLET: 5 | 30 days supply | Qty: 30 | Fill #1

## 2017-10-07 MED FILL — LOSARTAN POTASSIUM-HCTZ 50-: 50-12.5 | 90 days supply | Qty: 90 | Fill #1

## 2017-10-07 MED FILL — ONGLYZA 5 MG TABLET: 5 | 30 days supply | Qty: 30 | Fill #2

## 2017-10-07 MED FILL — metFORMIN HCL 1000 MG TABS: 1000 | 90 days supply | Qty: 180 | Fill #1

## 2017-10-07 MED FILL — AMMONIUM LACTATE 12% LOTION: 12 | 30 days supply | Qty: 226 | Fill #1

## 2017-10-07 MED FILL — LEVOTHYROXINE 25 MCG TABLET: 25 | 30 days supply | Qty: 30 | Fill #2

## 2017-11-07 MED FILL — LEVOTHYROXINE 25 MCG TABLET: 25 | 30 days supply | Qty: 30 | Fill #3

## 2017-12-10 ENCOUNTER — Telehealth: Payer: Self-pay | Admitting: Medical

## 2017-12-10 MED ORDER — LEVOTHYROXINE SODIUM 25 MCG PO TABS: 25.0000 ug | ORAL_TABLET | Freq: Every day | ORAL | 0 refills | Status: DC

## 2017-12-10 MED FILL — LEVOTHYROXINE 25 MCG TABLET: 25 | 30 days supply | Qty: 30 | Fill #0

## 2017-12-10 NOTE — Telephone Encounter (Signed)
Spoke with pt and informed her the below, pt scheduled her next appt on 12-25-2017 at 9:00 for her fu appt.

## 2017-12-10 NOTE — Telephone Encounter (Signed)
Is due for follow up please call and schedule appointment. Sent in refill for 30 days

## 2017-12-10 NOTE — Telephone Encounter (Signed)
Copied from [place] [id number]. Topic: Quick Communication - See Telephone Encounter >> Dec 10, 2017  8:45 AM Rosalin Hawking wrote: CRM for notification. See Telephone encounter for:  12/10/17.    Pt came in office requesting refill on levothyroxine (SYNTHROID, LEVOTHROID) 25 MCG tablet. Please send refill to [facility]. Please advise.

## 2017-12-19 ENCOUNTER — Encounter: Payer: Self-pay | Admitting: *Deleted

## 2017-12-19 NOTE — Progress Notes (Signed)
Medical necessity letter sent to [facility] along with medical records for Ova 1 blood test drawn on Mar 14, 2017. KW CMA

## 2017-12-25 ENCOUNTER — Encounter: Payer: Self-pay | Admitting: Medical

## 2017-12-25 ENCOUNTER — Ambulatory Visit: Payer: BLUE CROSS/BLUE SHIELD | Admitting: Medical

## 2017-12-25 VITALS — BP 125/76 | HR 88 | Temp 97.7°F | Resp 16 | Ht 64.0 in | Wt 224.4 lb

## 2017-12-25 DIAGNOSIS — Z23 Encounter for immunization: Secondary | ICD-10-CM

## 2017-12-25 DIAGNOSIS — E119 Type 2 diabetes mellitus without complications: Secondary | ICD-10-CM

## 2017-12-25 DIAGNOSIS — I10 Essential (primary) hypertension: Secondary | ICD-10-CM | POA: Diagnosis not present

## 2017-12-25 DIAGNOSIS — E039 Hypothyroidism, unspecified: Secondary | ICD-10-CM

## 2017-12-25 DIAGNOSIS — R05 Cough: Secondary | ICD-10-CM

## 2017-12-25 DIAGNOSIS — R059 Cough, unspecified: Secondary | ICD-10-CM

## 2017-12-25 LAB — COMPREHENSIVE METABOLIC PANEL
ALT: 100 U/L — ABNORMAL HIGH (ref 0–35)
AST: 96 U/L — ABNORMAL HIGH (ref 0–37)
Albumin: 4 g/dL (ref 3.5–5.2)
Alkaline Phosphatase: 100 U/L (ref 39–117)
BUN: 10 mg/dL (ref 6–23)
CALCIUM: 10 mg/dL (ref 8.4–10.5)
CHLORIDE: 100 meq/L (ref 96–112)
CO2: 28 meq/L (ref 19–32)
Creatinine, Ser: 0.78 mg/dL (ref 0.40–1.20)
GFR: 82.75 mL/min (ref >60.00)
Glucose, Bld: 291 mg/dL — ABNORMAL HIGH (ref 70–99)
Potassium: 3.7 mEq/L (ref 3.5–5.1)
SODIUM: 135 meq/L (ref 135–145)
Total Bilirubin: 0.6 mg/dL (ref 0.2–1.2)
Total Protein: 7.2 g/dL (ref 6.0–8.3)

## 2017-12-25 LAB — HEMOGLOBIN A1C: Hgb A1c MFr Bld: 9.1 % — ABNORMAL HIGH (ref 4.6–6.5)

## 2017-12-25 LAB — CBC WITH DIFFERENTIAL/PLATELET
BASOS PCT: 0.6 % (ref 0.0–3.0)
Basophils Absolute: 0 10*3/uL (ref 0.0–0.1)
EOS ABS: 0.1 10*3/uL (ref 0.0–0.7)
Eosinophils Relative: 0.8 % (ref 0.0–5.0)
HCT: 40.8 % (ref 36.0–46.0)
Hemoglobin: 13.7 g/dL (ref 12.0–15.0)
LYMPHS ABS: 2.1 10*3/uL (ref 0.7–4.0)
Lymphocytes Relative: 29.6 % (ref 12.0–46.0)
MCHC: 33.7 g/dL (ref 30.0–36.0)
MCV: 90 fl (ref 78.0–100.0)
MONO ABS: 0.5 10*3/uL (ref 0.1–1.0)
Monocytes Relative: 7.5 % (ref 3.0–12.0)
NEUTROS ABS: 4.3 10*3/uL (ref 1.4–7.7)
NEUTROS PCT: 61.5 % (ref 43.0–77.0)
PLATELETS: 217 10*3/uL (ref 150.0–400.0)
RBC: 4.53 Mil/uL (ref 3.87–5.11)
RDW: 14.1 % (ref 11.5–15.5)
WBC: 7 10*3/uL (ref 4.0–10.5)

## 2017-12-25 LAB — MICROALBUMIN, URINE: Microalb, Ur: 0.2 mg/dL

## 2017-12-25 LAB — TSH: TSH: 2.59 u[IU]/mL (ref 0.35–4.50)

## 2017-12-25 MED ORDER — LOSARTAN POTASSIUM-HCTZ 50-12.5 MG PO TABS: 1.0000 | ORAL_TABLET | Freq: Every day | ORAL | 1 refills | Status: DC

## 2017-12-25 MED ORDER — LEVOTHYROXINE SODIUM 25 MCG PO TABS: 25.0000 ug | ORAL_TABLET | Freq: Every day | ORAL | 0 refills | Status: DC

## 2017-12-25 MED ORDER — SAXAGLIPTIN HCL 5 MG PO TABS: 5.0000 mg | ORAL_TABLET | Freq: Every day | ORAL | 3 refills | Status: DC

## 2017-12-25 MED ORDER — METFORMIN HCL 1000 MG PO TABS: 1000.0000 mg | ORAL_TABLET | Freq: Two times a day (BID) | ORAL | 3 refills | Status: DC

## 2017-12-25 MED FILL — LOSARTAN POTASSIUM-HCTZ 50-: 50-12.5 | 90 days supply | Qty: 90 | Fill #0

## 2017-12-25 MED FILL — ONGLYZA 5 MG TABLET: 5 | 30 days supply | Qty: 30 | Fill #0

## 2017-12-25 MED FILL — metFORMIN HCL 1000 MG TABS: 1000 | 90 days supply | Qty: 180 | Fill #0

## 2017-12-25 NOTE — Patient Instructions (Signed)
Your blood pressure is well controlled today.  Continue current BP medications.  For your diabetes, will check metabolic panel, CBC and O9B.  Recommend more exercise as the weather warms up.  We will let you know the lab results when they are in.  For your hypothyroid history, I did place order for TSH.  Regarding your rare mild intermittent cough, your lungs sound clear today on exam.  I do not think this represents any infectious disease presently.  This is her first year in [place] and you might be having some very mild early allergy symptoms.  If you do get obvious other type allergy symptoms over the next 2 weeks then you could get the Xyzal and Flonase over-the-counter.  Follow-up date to be determined after lab review.

## 2017-12-25 NOTE — Progress Notes (Signed)
Subjective:    Patient ID: Deborah Henderson, female    DOB: 10-27-1965, 52 y.o.   MRN: [id number]  HPI  Pt in for follow up.  Her bp is well controlled. No side effects from bp medications.  Pt is diabetic and last a1c was 7.1. Pt labs showed good kidney.  Pt has low thyroid but last level showed in good range.  Very rare cough last week. Only in am and very brief and nonproductive. No uri or allergy signs or symptoms.   Review of Systems  Constitutional: Negative for chills, fatigue and fever.  HENT: Negative for congestion, ear pain, sinus pressure, sore throat, tinnitus and voice change.   Respiratory: Negative for cough, chest tightness, shortness of breath and wheezing.        Rare cough in morning.  Cardiovascular: Negative for chest pain and palpitations.  Gastrointestinal: Negative for abdominal pain, blood in stool, diarrhea, nausea and vomiting.  Musculoskeletal: Negative for back pain, gait problem and myalgias.  Skin: Negative for rash.  Neurological: Negative for dizziness, syncope, weakness, light-headedness and numbness.  Hematological: Negative for adenopathy. Does not bruise/bleed easily.  Psychiatric/Behavioral: Negative for agitation and behavioral problems. The patient is not nervous/anxious.     Past Medical History:  Diagnosis Date  . Diabetes mellitus ([place])    type 2  . Diverticulosis   . Hypertension   . Hypothyroidism      Social History   Socioeconomic History  . Marital status: Married    Spouse name: Not on file  . Number of children: Not on file  . Years of education: Not on file  . Highest education level: Not on file  Social Needs  . Financial resource strain: Not on file  . Food insecurity - worry: Not on file  . Food insecurity - inability: Not on file  . Transportation needs - medical: Not on file  . Transportation needs - non-medical: Not on file  Occupational History  . Not on file  Tobacco Use  . Smoking status: Former  Smoker    Last attempt to quit: 11/09/1989    Years since quitting: 28.1  . Smokeless tobacco: Never Used  . Tobacco comment: Quit >20 years ago  Substance and Sexual Activity  . Alcohol use: No  . Drug use: No  . Sexual activity: Yes  Other Topics Concern  . Not on file  Social History Narrative  . Not on file    Past Surgical History:  Procedure Laterality Date  . CESAREAN SECTION     x2  . CHOLECYSTECTOMY    . CHOLECYSTECTOMY, LAPAROSCOPIC  06/02/2015   [place]  . ECTOPIC PREGNANCY SURGERY    . OVARIAN CYST SURGERY  1988   Left  . ROBOTIC ASSISTED BILATERAL SALPINGO OOPHERECTOMY N/A 05/21/2017   Procedure: XI ROBOTIC ASSIST  LEFT SALPINGO OOPHORECTOMY, LYSIS OF ADHESIONS;  Surgeon: Nancy Marus, MD;  Location: [facility];  Service: Gynecology;  Laterality: N/A;    Family History  Problem Relation Age of Onset  . Cancer Father        colon  . Diabetes Father   . Cancer Brother        bone   . Diabetes Brother   . Diabetes Brother     Allergies  Allergen Reactions  . Asa [Aspirin] Anaphylaxis and Rash    Current Outpatient Medications on File Prior to Visit  Medication Sig Dispense Refill  . ammonium lactate (AMLACTIN) 12 % lotion Apply 1 application topically  as needed for dry skin. Apply to area twice daily (Patient taking differently: Apply 1 application topically 2 (two) times daily as needed for dry skin. Pt applies to both legs.) 225 g 1  . fluconazole (DIFLUCAN) 200 MG tablet Take 1 tablet (200 mg total) by mouth daily. 3 tablet 2  . oxyCODONE-acetaminophen (PERCOCET/ROXICET) 5-325 MG tablet Take 1-2 tablets by mouth every 4 (four) hours as needed for severe pain. 20 tablet 0   No current facility-administered medications on file prior to visit.     BP 125/76   Pulse 88   Temp 97.7 F (36.5 C) (Oral)   Resp 16   Ht 5\' 4"  (1.626 m)   Wt 224 lb 6.4 oz (101.8 kg)   SpO2 98%   BMI 38.52 kg/m       Objective:   Physical Exam  General Mental  Status- Alert. General Appearance- Not in acute distress.   Skin General: Color- Normal Color. Moisture- Normal Moisture.  Neck Carotid Arteries- Normal color. Moisture- Normal Moisture. No carotid bruits. No JVD.  Chest and Lung Exam Auscultation: Breath Sounds:-Normal.  Cardiovascular Auscultation:Rythm- Regular. Murmurs & Other Heart Sounds:Auscultation of the heart reveals- No Murmurs.  Abdomen Inspection:-Inspeection Normal. Palpation/Percussion:Note:No mass. Palpation and Percussion of the abdomen reveal- Non Tender, Non Distended + BS, no rebound or guarding.   Neurologic Cranial Nerve exam:- CN III-XII intact(No nystagmus), symmetric smile. Strength:- 5/5 equal and symmetric strength both upper and lower extremities.  Lower extremity-no pedal edema, negative Homans sign.  Calves symmetric.    Assessment & Plan:  Your blood pressure is well controlled today.  Continue current BP medications.  For your diabetes, will check metabolic panel, CBC and U8E.  Recommend more exercise as the weather warms up.  We will let you know the lab results when they are in.  For your hypothyroid history, I did place order for TSH.  Regarding your rare mild intermittent cough, your lungs sound clear today on exam.  I do not think this represents any infectious disease presently.  This is her first year in [place] and you might be having some very mild early allergy symptoms.  If you do get obvious other type allergy symptoms over the next 2 weeks then you could get the Xyzal and Flonase over-the-counter.  Follow-up date to be determined after lab review.  Mackie Pai, PA-C

## 2017-12-30 ENCOUNTER — Telehealth: Payer: Self-pay | Admitting: Medical

## 2017-12-30 MED ORDER — SAXAGLIPTIN HCL 5 MG PO TABS: 5.0000 mg | ORAL_TABLET | Freq: Every day | ORAL | 3 refills | Status: DC

## 2017-12-30 MED ORDER — LIRAGLUTIDE 18 MG/3ML ~~LOC~~ SOPN: PEN_INJECTOR | SUBCUTANEOUS | 3 refills | Status: DC

## 2017-12-30 NOTE — Telephone Encounter (Signed)
Prescription of victoza printed.

## 2017-12-31 ENCOUNTER — Telehealth: Payer: Self-pay | Admitting: Medical

## 2017-12-31 NOTE — Telephone Encounter (Signed)
Copied from [place] [phone]. Topic: Quick Communication - See Telephone Encounter >> Dec 31, 2017  8:33 AM Rosalin Hawking wrote: CRM for notification. See Telephone encounter for:  12/31/17.   Pt came in office stating that had seen her lab results from mychart, pt stated that provider indicated that will send a rx of victoza (inj) to her pharmacy for pt to start on since her sugar level was elevated, pt wants to try to do a better diet with her sugar level and that she is wanting to continue on onglyza, pt does not want to start with victoza since she does not like any inj at all. Pt  would like to explain to provider that she is willing to do better diet and have other option to make better her sugar levels. Please advise pt tel [phone] (spanish speaking) or call her spouse at [phone]. Please advise.

## 2017-12-31 NOTE — Telephone Encounter (Signed)
See 12/25/17 lab result note. Marin Roberts, [place] spoke with patient regarding questions.

## 2017-12-31 NOTE — Telephone Encounter (Signed)
Dc'd Victoza in epic. Will you call pharmacy and cancel the script officially.

## 2018-01-01 NOTE — Telephone Encounter (Signed)
Called pharmacy

## 2018-01-02 MED FILL — LEVOTHYROXINE 25 MCG TABLET: 25 | 30 days supply | Qty: 30 | Fill #0

## 2018-01-27 MED FILL — ONGLYZA 5 MG TABLET: 5 | 30 days supply | Qty: 30 | Fill #1

## 2018-01-29 ENCOUNTER — Other Ambulatory Visit: Payer: Self-pay

## 2018-01-29 ENCOUNTER — Other Ambulatory Visit: Payer: Self-pay | Admitting: Medical

## 2018-01-29 ENCOUNTER — Telehealth: Payer: Self-pay | Admitting: Medical

## 2018-01-29 MED ORDER — LEVOTHYROXINE SODIUM 25 MCG PO TABS: 25.0000 ug | ORAL_TABLET | Freq: Every day | ORAL | 0 refills | Status: DC

## 2018-01-29 MED FILL — LEVOTHYROXINE 25 MCG TABLET: 25 | 30 days supply | Qty: 30 | Fill #0

## 2018-01-29 NOTE — Telephone Encounter (Signed)
Copied from [place] [id number]. Topic: Quick Communication - Rx Refill/Question >> Jan 29, 2018 11:04 AM Yvette Rack wrote: Medication:    levothyroxine (SYNTHROID, LEVOTHROID) 25 MCG tablet  Has the patient contacted their pharmacy? They state that they will but  Pt husband hung up on me when I told him to still call them the pharmacy even though it was no more refills on this medicine (Agent: If no, request that the patient contact the pharmacy for the refill.) Preferred Pharmacy (with phone number or street name):   [place], [place] - [address] [phone] (Phone) [phone] (Fax)     Agent: Please be advised that RX refills may take up to 3 business days. We ask that you follow-up with your pharmacy.

## 2018-02-26 MED FILL — ONGLYZA 5 MG TABLET: 5 | 30 days supply | Qty: 30 | Fill #2

## 2018-03-31 MED FILL — ONGLYZA 5 MG TABLET: 5 | 30 days supply | Qty: 30 | Fill #3

## 2018-04-09 ENCOUNTER — Ambulatory Visit: Payer: BLUE CROSS/BLUE SHIELD | Admitting: Medical

## 2018-04-09 ENCOUNTER — Encounter: Payer: Self-pay | Admitting: Medical

## 2018-04-09 VITALS — BP 120/76 | HR 73 | Temp 98.3°F | Resp 16 | Ht 63.0 in | Wt 216.0 lb

## 2018-04-09 DIAGNOSIS — R21 Rash and other nonspecific skin eruption: Secondary | ICD-10-CM

## 2018-04-09 DIAGNOSIS — F419 Anxiety disorder, unspecified: Secondary | ICD-10-CM

## 2018-04-09 DIAGNOSIS — E119 Type 2 diabetes mellitus without complications: Secondary | ICD-10-CM

## 2018-04-09 DIAGNOSIS — I10 Essential (primary) hypertension: Secondary | ICD-10-CM

## 2018-04-09 DIAGNOSIS — E039 Hypothyroidism, unspecified: Secondary | ICD-10-CM | POA: Diagnosis not present

## 2018-04-09 LAB — COMPREHENSIVE METABOLIC PANEL
ALBUMIN: 4.2 g/dL (ref 3.5–5.2)
ALK PHOS: 73 U/L (ref 39–117)
ALT: 94 U/L — AB (ref 0–35)
AST: 53 U/L — AB (ref 0–37)
BILIRUBIN TOTAL: 0.4 mg/dL (ref 0.2–1.2)
BUN: 12 mg/dL (ref 6–23)
CALCIUM: 9.6 mg/dL (ref 8.4–10.5)
CO2: 28 mEq/L (ref 19–32)
CREATININE: 0.84 mg/dL (ref 0.40–1.20)
Chloride: 102 mEq/L (ref 96–112)
GFR: 75.88 mL/min (ref >60.00)
Glucose, Bld: 112 mg/dL — ABNORMAL HIGH (ref 70–99)
Potassium: 4 mEq/L (ref 3.5–5.1)
Sodium: 139 mEq/L (ref 135–145)
Total Protein: 7 g/dL (ref 6.0–8.3)

## 2018-04-09 LAB — HEMOGLOBIN A1C: Hgb A1c MFr Bld: 7 % — ABNORMAL HIGH (ref 4.6–6.5)

## 2018-04-09 LAB — LIPID PANEL
CHOLESTEROL: 140 mg/dL (ref 0–200)
HDL: 35.3 mg/dL — AB (ref >39.00)
LDL Cholesterol: 86 mg/dL (ref 0–99)
NonHDL: 104.71
TRIGLYCERIDES: 93 mg/dL (ref 0.0–149.0)
Total CHOL/HDL Ratio: 4
VLDL: 18.6 mg/dL (ref 0.0–40.0)

## 2018-04-09 MED ORDER — CLOTRIMAZOLE-BETAMETHASONE 1-0.05 % EX CREA: 1.0000 "application " | TOPICAL_CREAM | Freq: Two times a day (BID) | CUTANEOUS | 1 refills | Status: DC

## 2018-04-09 MED ORDER — METFORMIN HCL 1000 MG PO TABS: 1000.0000 mg | ORAL_TABLET | Freq: Two times a day (BID) | ORAL | 3 refills | Status: DC

## 2018-04-09 MED ORDER — LEVOTHYROXINE SODIUM 25 MCG PO TABS: 25.0000 ug | ORAL_TABLET | Freq: Every day | ORAL | 3 refills | Status: DC

## 2018-04-09 MED ORDER — CLONAZEPAM 0.5 MG PO TABS: 0.5000 mg | ORAL_TABLET | Freq: Two times a day (BID) | ORAL | 0 refills | Status: DC | PRN

## 2018-04-09 MED ORDER — LOSARTAN POTASSIUM-HCTZ 50-12.5 MG PO TABS: 1.0000 | ORAL_TABLET | Freq: Every day | ORAL | 1 refills | Status: DC

## 2018-04-09 MED ORDER — SAXAGLIPTIN HCL 5 MG PO TABS: 5.0000 mg | ORAL_TABLET | Freq: Every day | ORAL | 3 refills | Status: DC

## 2018-04-09 MED ORDER — NYSTATIN 100000 UNIT/GM EX CREA: 1.0000 "application " | TOPICAL_CREAM | Freq: Two times a day (BID) | CUTANEOUS | 1 refills | Status: DC

## 2018-04-09 MED FILL — metFORMIN HCL 1000 MG TABS: 1000 | 90 days supply | Qty: 180 | Fill #0

## 2018-04-09 MED FILL — NYSTATIN 100,000 UNIT/GM CR: 100000 | 15 days supply | Qty: 30 | Fill #0

## 2018-04-09 MED FILL — LEVOTHYROXINE 25 MCG TABLET: 25 | 30 days supply | Qty: 30 | Fill #0

## 2018-04-09 MED FILL — clonazePAM 0.5 MG TABS: 0.5 | 10 days supply | Qty: 20 | Fill #0

## 2018-04-09 MED FILL — CLOTRIMAZOLE-BETAMETHASONE: 1-0.05 | 15 days supply | Qty: 30 | Fill #0

## 2018-04-09 MED FILL — LOSARTAN POTASSIUM-HCTZ 50-: 50-12.5 | 90 days supply | Qty: 90 | Fill #0

## 2018-04-09 NOTE — Progress Notes (Signed)
Subjective:    Patient ID: Deborah Henderson, female    DOB: 1966/09/15, 52 y.o.   MRN: [id number]  HPI  Pt in with recent areas of itching. One area mid lumbar, left pretibial area, left thigh as well.  In addition itching areas under her breast.  She states these areas itch for about 3 months.   Pt states she used lotrisone and states area will improve then return.  Pt states recent rash beneath breast but used lotrisone and it resolved completely.  She is going to go on vacation upcoming. Her husband is going to drive. She states she will often get anxious with traveling. When travels in heavy traffic states makes her very nervous. Pt is not going to drive.  Pt not sure how high her sugars are. She has extreme fear of needles. She declined use of victoza due to this. She needs refill of onglyza.   Review of Systems  Constitutional: Negative for chills, fatigue and fever.  HENT: Negative for congestion and postnasal drip.   Respiratory: Negative for chest tightness, shortness of breath, wheezing and stridor.   Cardiovascular: Negative for chest pain and palpitations.  Gastrointestinal: Negative for abdominal distention, abdominal pain, blood in stool, constipation, diarrhea and nausea.  Endocrine: Negative for polydipsia, polyphagia and polyuria.  Musculoskeletal: Negative for back pain, joint swelling, myalgias and neck stiffness.  Skin: Positive for rash.  Neurological: Negative for dizziness, weakness and headaches.  Hematological: Negative for adenopathy. Does not bruise/bleed easily.  Psychiatric/Behavioral: Negative for behavioral problems, confusion, self-injury and sleep disturbance. The patient is nervous/anxious.        Objective:   Physical Exam  General Mental Status- Alert. General Appearance- Not in acute distress.   Skin General: Color- Normal Color. Moisture- Normal Moisture. One mid lumbar slight 5-6 mm hyperpigmented faint rough rash. Uniform in  color. Also small papular lesion left upper pretibial area and one left upper thigh. Under breast not checked as she states that has resolved.  Neck Carotid Arteries- Normal color. Moisture- Normal Moisture. No carotid bruits. No JVD.  Chest and Lung Exam Auscultation: Breath Sounds:-Normal.  Cardiovascular Auscultation:Rythm- Regular. Murmurs & Other Heart Sounds:Auscultation of the heart reveals- No Murmurs.  Abdomen Inspection:-Inspeection Normal. Palpation/Percussion:Note:No mass. Palpation and Percussion of the abdomen reveal- Non Tender, Non Distended + BS, no rebound or guarding.    Neurologic Cranial Nerve exam:- CN III-XII intact(No nystagmus), symmetric smile. Strength:- 5/5 equal and symmetric strength both upper and lower extremities.      Assessment & Plan:  Your blood pressure is well controlled today and refilled your losartan.  You recently ran out of thyroid medication.  I refilled that today.  Decided not to do level today since he ran out but will do level in the near future when you are on medication.  We will get a metabolic panel and Y1E today to check your diabetes.  Unfortunately you have extreme aversion to needles and declined Victoza.  We will see how you are doing on metformin and Onglyza regimen.  For upcoming trip and anticipated severe anxiety traveling, I did prescribe clonazepam.  Rx advisement given.  Brief 10-day course made available.  For your areas of rash, I did prescribe Lotrisone as that has worked for you in the past.  You can apply that to your mid lumbar area and on your leg region.  For any rash underneath your breast it reoccurs, I think nystatin would be better for that area.  Would asked that she  make follow-up appointment in about 3weeks.  I would like to assess the impact of Lotrisone on those areas.  His skin appearance not resolved might refer you to dermatologist.  Follow-up in 3 weeks or as needed.  Mackie Pai, PA-C

## 2018-04-09 NOTE — Patient Instructions (Signed)
Your blood pressure is well controlled today and refilled your losartan.  You recently ran out of thyroid medication.  I refilled that today.  Decided not to do level today since he ran out but will do level in the near future when you are on medication.  We will get a metabolic panel and T4S today to check your diabetes.  Unfortunately you have extreme aversion to needles and declined Victoza.  We will see how you are doing on metformin and Onglyza regimen.  For upcoming trip and anticipated severe anxiety traveling, I did prescribe clonazepam.  Rx advisement given.  Brief 10-day course made available.  For your areas of rash, I did prescribe Lotrisone as that has worked for you in the past.  You can apply that to your mid lumbar area and on your leg region.  For any rash underneath your breast it reoccurs, I think nystatin would be better for that area.  Would asked that she make follow-up appointment in about 3weeks.  I would like to assess the impact of Lotrisone on those areas.  His skin appearance not resolved might refer you to dermatologist.  Follow-up in 3 weeks or as needed.

## 2018-04-30 MED FILL — ONGLYZA 5 MG TABLET: 5 | 30 days supply | Qty: 30 | Fill #0

## 2018-05-08 MED FILL — LEVOTHYROXINE 25 MCG TABLET: 25 | 30 days supply | Qty: 30 | Fill #1

## 2018-06-03 MED FILL — LEVOTHYROXINE 25 MCG TABLET: 25 | 30 days supply | Qty: 30 | Fill #2

## 2018-06-03 MED FILL — ONGLYZA 5 MG TABLET: 5 | 30 days supply | Qty: 30 | Fill #1

## 2018-07-03 MED FILL — ONGLYZA 5 MG TABLET: 5 | 30 days supply | Qty: 30 | Fill #2

## 2018-07-03 MED FILL — LEVOTHYROXINE 25 MCG TABLET: 25 | 30 days supply | Qty: 30 | Fill #3

## 2018-07-31 ENCOUNTER — Telehealth: Payer: Self-pay | Admitting: Medical

## 2018-07-31 MED ORDER — LEVOTHYROXINE SODIUM 25 MCG PO TABS: 25.0000 ug | ORAL_TABLET | Freq: Every day | ORAL | 3 refills | Status: DC

## 2018-07-31 MED FILL — metFORMIN HCL 1000 MG TABS: 1000 | 90 days supply | Qty: 180 | Fill #1

## 2018-07-31 MED FILL — LEVOTHYROXINE 25 MCG TABLET: 25 | 30 days supply | Qty: 30 | Fill #0

## 2018-07-31 MED FILL — ONGLYZA 5 MG TABLET: 5 | 30 days supply | Qty: 30 | Fill #3

## 2018-07-31 MED FILL — LOSARTAN-HCTZ 50-12.5 MG TA: 50-12.5 | 90 days supply | Qty: 90 | Fill #1

## 2018-07-31 NOTE — Telephone Encounter (Signed)
Copied from [place] [phone]. Topic: Quick Communication - See Telephone Encounter >> Jul 31, 2018 11:27 AM Rosalin Hawking wrote: CRM for notification. See Telephone encounter for: 07/31/18.   Pt came in stating needing refill on levothyroxine (SYNTHROID, LEVOTHROID) 25 MCG tablet and also needing saxagliptin HCl (ONGLYZA) 5 MG TABS tablet sent to [facility]. Please advise.

## 2018-08-08 ENCOUNTER — Ambulatory Visit (INDEPENDENT_AMBULATORY_CARE_PROVIDER_SITE_OTHER): Payer: BLUE CROSS/BLUE SHIELD

## 2018-08-08 DIAGNOSIS — Z23 Encounter for immunization: Secondary | ICD-10-CM | POA: Diagnosis not present

## 2018-08-08 NOTE — Progress Notes (Signed)
Pre visit review using our clinic review tool, if applicable. No additional management support is needed unless otherwise documented below in the visit note.  Patient here for influenza vaccination. VIS given. Vaccine given in left deltoid IM. Tolerated well.

## 2018-08-27 MED FILL — LEVOTHYROXINE 25 MCG TABLET: 25 | 30 days supply | Qty: 30 | Fill #1

## 2018-08-27 MED FILL — ONGLYZA 5 MG TABLET: 5 | 30 days supply | Qty: 30 | Fill #0

## 2018-09-04 ENCOUNTER — Encounter: Payer: Self-pay | Admitting: Obstetrics & Gynecology

## 2018-09-04 ENCOUNTER — Ambulatory Visit: Payer: BLUE CROSS/BLUE SHIELD | Admitting: Obstetrics & Gynecology

## 2018-09-04 VITALS — BP 132/80 | Ht 63.5 in | Wt 218.0 lb

## 2018-09-04 DIAGNOSIS — Z5320 Procedure and treatment not carried out because of patient's decision for unspecified reasons: Secondary | ICD-10-CM

## 2018-09-04 DIAGNOSIS — Z780 Asymptomatic menopausal state: Secondary | ICD-10-CM | POA: Diagnosis not present

## 2018-09-04 DIAGNOSIS — E6609 Other obesity due to excess calories: Secondary | ICD-10-CM | POA: Diagnosis not present

## 2018-09-04 DIAGNOSIS — Z90721 Acquired absence of ovaries, unilateral: Secondary | ICD-10-CM | POA: Diagnosis not present

## 2018-09-04 DIAGNOSIS — Z01419 Encounter for gynecological examination (general) (routine) without abnormal findings: Secondary | ICD-10-CM | POA: Diagnosis not present

## 2018-09-04 DIAGNOSIS — Z6838 Body mass index (BMI) 38.0-38.9, adult: Secondary | ICD-10-CM

## 2018-09-04 NOTE — Progress Notes (Signed)
Deborah Henderson Feb 14, 1966 [id number]   History:    52 y.o. G3P2A1L2 Married  RP:  Established patient presenting for annual gyn exam   HPI: Robotic Left SO, Lysis of Omental Adhesions on 05/21/2017 with Dr Alycia Rossetti.  Patho:  Benign mucinous Cystadenoma.  No complication.  Menopause x 2016, well with mild occasional night sweats, declines HRT.  No PMB.  No pelvic pain.  Occasional mild left abdominal discomfort.  Urine/BMs wnl.  Breasts wnl.  BMI 38.01.  Not very physically active.  Health Labs with Fam MD.  Past medical history,surgical history, family history and social history were all reviewed and documented in the EPIC chart.  Gynecologic History No LMP recorded. Patient is postmenopausal. Contraception: post menopausal status Last Pap: 2017. Results were: normal per patient Last mammogram: 04/2016. Results were: Negative Bone Density: Never Colonoscopy: 2016  Obstetric History OB History  Gravida Para Term Preterm AB Living  3 2     1 2   SAB TAB Ectopic Multiple Live Births      1        # Outcome Date GA Lbr Len/2nd Weight Sex Delivery Anes PTL Lv  3 Ectopic           2 Para           1 Para              ROS: A ROS was performed and pertinent positives and negatives are included in the history.  GENERAL: No fevers or chills. HEENT: No change in vision, no earache, sore throat or sinus congestion. NECK: No pain or stiffness. CARDIOVASCULAR: No chest pain or pressure. No palpitations. PULMONARY: No shortness of breath, cough or wheeze. GASTROINTESTINAL: No abdominal pain, nausea, vomiting or diarrhea, melena or bright red blood per rectum. GENITOURINARY: No urinary frequency, urgency, hesitancy or dysuria. MUSCULOSKELETAL: No joint or muscle pain, no back pain, no recent trauma. DERMATOLOGIC: No rash, no itching, no lesions. ENDOCRINE: No polyuria, polydipsia, no heat or cold intolerance. No recent change in weight. HEMATOLOGICAL: No anemia or easy bruising or bleeding.  NEUROLOGIC: No headache, seizures, numbness, tingling or weakness. PSYCHIATRIC: No depression, no loss of interest in normal activity or change in sleep pattern.     Exam:   BP 132/80   Ht 5' 3.5" (1.613 m)   Wt 218 lb (98.9 kg)   BMI 38.01 kg/m   Body mass index is 38.01 kg/m.  General appearance : Well developed well nourished female. No acute distress HEENT: Eyes: no retinal hemorrhage or exudates,  Neck supple, trachea midline, no carotid bruits, no thyroidmegaly Lungs: Clear to auscultation, no rhonchi or wheezes, or rib retractions  Heart: Regular rate and rhythm, no murmurs or gallops Breast:Examined in sitting and supine position were symmetrical in appearance, no palpable masses or tenderness,  no skin retraction, no nipple inversion, no nipple discharge, no skin discoloration, no axillary or supraclavicular lymphadenopathy Abdomen: no palpable masses or tenderness, no rebound or guarding Extremities: no edema or skin discoloration or tenderness  Pelvic: Vulva: Normal             Vagina: No gross lesions or discharge  Cervix: No gross lesions or discharge.  Pap reflex done  Uterus  AV, normal size, shape and consistency, non-tender and mobile  Adnexa  Without masses or tenderness  Anus: Normal   Assessment/Plan:  52 y.o. female for annual exam   1. Encounter for gynecological examination with Papanicolaou smear of cervix Normal gynecologic exam.  Pap  reflex done.  Breast exam normal.  Last mammogram July 2017 was negative, patient will schedule a screening mammogram now.  Colonoscopy 2016.  Health labs with family physician.  2. Menopause present, declines hormone replacement therapy Well on no hormone replacement therapy.  No postmenopausal bleeding.  Vitamin D supplements, calcium intake of 1.5 g/day and regular weightbearing physical activity recommended.  3. Class 2 obesity due to excess calories without serious comorbidity with body mass index (BMI) of 38.0 to  38.9 in adult Recommend low calorie/low carb diet such as [employer].  Aerobic physical activity 5 times a week and weightlifting every 2 days.  4. Status post unilateral salpingo-oophorectomy Left SO.  Patho:  Benign Mucinous Cystadenoma 04/2017.  Princess Bruins MD, 12:10 PM 09/04/2018

## 2018-09-04 NOTE — Patient Instructions (Signed)
1. Encounter for gynecological examination with Papanicolaou smear of cervix Normal gynecologic exam.  Pap reflex done.  Breast exam normal.  Last mammogram July 2017 was negative, patient will schedule a screening mammogram now.  Colonoscopy 2016.  Health labs with family physician.  2. Menopause present, declines hormone replacement therapy Well on no hormone replacement therapy.  No postmenopausal bleeding.  Vitamin D supplements, calcium intake of 1.5 g/day and regular weightbearing physical activity recommended.  3. Class 2 obesity due to excess calories without serious comorbidity with body mass index (BMI) of 38.0 to 38.9 in adult Recommend low calorie/low carb diet such as [employer].  Aerobic physical activity 5 times a week and weightlifting every 2 days.  4. Status post unilateral salpingo-oophorectomy Left SO.  Patho:  Benign Mucinous Cystadenoma 04/2017.  Asencion Partridge, fue un placer verle hoy!  Voy a informarle de sus [employer].

## 2018-09-05 ENCOUNTER — Telehealth: Payer: Self-pay | Admitting: *Deleted

## 2018-09-05 LAB — PAP IG W/ RFLX HPV ASCU

## 2018-09-05 NOTE — Telephone Encounter (Signed)
Office notes faxed to [phone]

## 2018-09-05 NOTE — Telephone Encounter (Signed)
Referral placed in [place] referral workqueue at [facility] they will call patient to schedule.

## 2018-09-05 NOTE — Telephone Encounter (Signed)
-----   Message from Princess Bruins, MD sent at 09/04/2018  1:02 PM EST ----- Regarding: Refer to Dermatology Needs a full skin exam and patient was concerned with moles on right breast.

## 2018-09-10 ENCOUNTER — Other Ambulatory Visit: Payer: Self-pay | Admitting: Obstetrics & Gynecology

## 2018-09-10 DIAGNOSIS — Z1231 Encounter for screening mammogram for malignant neoplasm of breast: Secondary | ICD-10-CM

## 2018-09-15 ENCOUNTER — Ambulatory Visit (HOSPITAL_BASED_OUTPATIENT_CLINIC_OR_DEPARTMENT_OTHER)
Admission: RE | Admit: 2018-09-15 | Discharge: 2018-09-15 | Disposition: A | Discharge status: Home / Self Care | Payer: BLUE CROSS/BLUE SHIELD | Source: Ambulatory Visit | Attending: Obstetrics & Gynecology | Admitting: Obstetrics & Gynecology

## 2018-09-15 ENCOUNTER — Encounter (HOSPITAL_BASED_OUTPATIENT_CLINIC_OR_DEPARTMENT_OTHER): Payer: Self-pay

## 2018-09-15 DIAGNOSIS — Z1231 Encounter for screening mammogram for malignant neoplasm of breast: Secondary | ICD-10-CM | POA: Insufficient documentation

## 2018-09-29 ENCOUNTER — Telehealth: Payer: Self-pay | Admitting: Medical

## 2018-09-29 MED ORDER — LOSARTAN POTASSIUM-HCTZ 50-12.5 MG PO TABS: 1.0000 | ORAL_TABLET | Freq: Every day | ORAL | 1 refills | Status: DC

## 2018-09-29 MED FILL — LEVOTHYROXINE 25 MCG TABLET: 25 | 30 days supply | Qty: 30 | Fill #2

## 2018-09-29 MED FILL — ONGLYZA 5 MG TABLET: 5 | 30 days supply | Qty: 30 | Fill #1

## 2018-09-29 NOTE — Telephone Encounter (Signed)
Rx sent to pharmacy   

## 2018-09-29 NOTE — Telephone Encounter (Signed)
Copied from [place] [id number]. Topic: Quick Communication - See Telephone Encounter >> Sep 29, 2018  8:39 AM Rosalin Hawking wrote: CRM for notification. See Telephone encounter for: 09/29/18.   Pt is needing refill on losartan-hydrochlorothiazide (HYZAAR) 50-12.5 MG tablet at [facility]. Pt states that insurance will not be in service next year and is needing her refill sooner. Please advise.

## 2018-10-08 MED FILL — LOSARTAN POTASSIUM 50 MG TA: 50 | 90 days supply | Qty: 90 | Fill #0

## 2018-10-08 MED FILL — HYDROCHLOROTHIAZIDE 12.5 MG: 12.5 | 90 days supply | Qty: 90 | Fill #0

## 2018-10-08 NOTE — Telephone Encounter (Signed)
Spoke with [place] office and patient never called back to schedule.

## 2018-11-19 ENCOUNTER — Ambulatory Visit (INDEPENDENT_AMBULATORY_CARE_PROVIDER_SITE_OTHER): Admitting: Medical

## 2018-11-19 ENCOUNTER — Encounter: Payer: Self-pay | Admitting: Gastroenterology

## 2018-11-19 ENCOUNTER — Encounter: Payer: Self-pay | Admitting: Medical

## 2018-11-19 VITALS — BP 127/78 | HR 86 | Temp 98.1°F | Resp 16 | Ht 63.5 in | Wt 212.2 lb

## 2018-11-19 DIAGNOSIS — I10 Essential (primary) hypertension: Secondary | ICD-10-CM | POA: Diagnosis not present

## 2018-11-19 DIAGNOSIS — K649 Unspecified hemorrhoids: Secondary | ICD-10-CM

## 2018-11-19 DIAGNOSIS — E039 Hypothyroidism, unspecified: Secondary | ICD-10-CM

## 2018-11-19 DIAGNOSIS — E119 Type 2 diabetes mellitus without complications: Secondary | ICD-10-CM | POA: Diagnosis not present

## 2018-11-19 LAB — COMPREHENSIVE METABOLIC PANEL
ALBUMIN: 4.3 g/dL (ref 3.5–5.2)
ALK PHOS: 144 U/L — AB (ref 39–117)
ALT: 236 U/L — AB (ref 0–35)
AST: 167 U/L — AB (ref 0–37)
BILIRUBIN TOTAL: 0.7 mg/dL (ref 0.2–1.2)
BUN: 13 mg/dL (ref 6–23)
CHLORIDE: 98 meq/L (ref 96–112)
CO2: 27 mEq/L (ref 19–32)
CREATININE: 0.76 mg/dL (ref 0.40–1.20)
Calcium: 10.2 mg/dL (ref 8.4–10.5)
GFR: 79.94 mL/min (ref >60.00)
Glucose, Bld: 395 mg/dL — ABNORMAL HIGH (ref 70–99)
Potassium: 4 mEq/L (ref 3.5–5.1)
SODIUM: 135 meq/L (ref 135–145)
TOTAL PROTEIN: 7.2 g/dL (ref 6.0–8.3)

## 2018-11-19 LAB — TSH: TSH: 1.64 u[IU]/mL (ref 0.35–4.50)

## 2018-11-19 LAB — HEMOGLOBIN A1C: HEMOGLOBIN A1C: 11.7 % — AB (ref 4.6–6.5)

## 2018-11-19 MED ORDER — HYDROCORTISONE ACETATE 25 MG RE SUPP: 25.0000 mg | Freq: Two times a day (BID) | RECTAL | 0 refills | Status: DC | PRN

## 2018-11-19 MED ORDER — HYDROCODONE-ACETAMINOPHEN 5-325 MG PO TABS: 1.0000 | ORAL_TABLET | Freq: Four times a day (QID) | ORAL | 0 refills | Status: DC | PRN

## 2018-11-19 MED FILL — LEVOTHYROXINE 25 MCG TABLET: 25 | 30 days supply | Qty: 30 | Fill #3

## 2018-11-19 MED FILL — HYDROCODON-APAP 5-325: 5-325 | 4 days supply | Qty: 16 | Fill #0

## 2018-11-19 NOTE — Patient Instructions (Addendum)
For hemorrhoids will rx anusol hc suppository to use daily. Also can do sitz bath daily. Will refer to specialist to evaluate recurrent hemorrhoid flare. Rx norco for severe pain if needed.  For diabetes will get a1c and cmp. Continue metformin and may prescribe januvia or glipizide. Depends on results of labs.   Get tsh today and then refill thyroid med.  Blood pressure is well controlled today.  Continue current medication regimen.  Follow up in 3 months or as needed

## 2018-11-19 NOTE — Progress Notes (Signed)
Subjective:    Patient ID: Harvest Dark, female    DOB: 1965/10/24, 53 y.o.   MRN: [id number]  HPI  Pt in with some hx of hemorrhoids. She has known history of internal and external hemorrhoids. Pt had known hemorrhoids for about 15 years. Describes mild flares intermittently.  Pt has pain severe for about one week. Pt tried prep h, ice and other otc cream.  Pt described bleeding.  Pt has diabetes. Last a1c was 7.0. Pt states onglyza will cost her $140 approximate. New plan will require her to cost 25% of total.  Hx of low thyroid. Will get tsh today.   Review of Systems  Constitutional: Negative for chills and fatigue.  Respiratory: Negative for cough, chest tightness, shortness of breath and wheezing.   Cardiovascular: Negative for chest pain and palpitations.  Gastrointestinal: Negative for abdominal pain.       See hpi.   Musculoskeletal: Negative for back pain, myalgias and neck pain.  Skin: Negative for rash.  Hematological: Negative for adenopathy. Does not bruise/bleed easily.  Psychiatric/Behavioral: Negative for behavioral problems and confusion. The patient is not nervous/anxious.     Past Medical History:  Diagnosis Date  . Diabetes mellitus ([place])    type 2  . Diverticulosis   . Hypertension   . Hypothyroidism      Social History   Socioeconomic History  . Marital status: Married    Spouse name: Not on file  . Number of children: Not on file  . Years of education: Not on file  . Highest education level: Not on file  Occupational History  . Not on file  Social Needs  . Financial resource strain: Not on file  . Food insecurity:    Worry: Not on file    Inability: Not on file  . Transportation needs:    Medical: Not on file    Non-medical: Not on file  Tobacco Use  . Smoking status: Former Smoker    Last attempt to quit: 11/09/1989    Years since quitting: 29.0  . Smokeless tobacco: Never Used  . Tobacco comment: Quit >20 years ago    Substance and Sexual Activity  . Alcohol use: No  . Drug use: No  . Sexual activity: Yes  Lifestyle  . Physical activity:    Days per week: Not on file    Minutes per session: Not on file  . Stress: Not on file  Relationships  . Social connections:    Talks on phone: Not on file    Gets together: Not on file    Attends religious service: Not on file    Active member of club or organization: Not on file    Attends meetings of clubs or organizations: Not on file    Relationship status: Not on file  . Intimate partner violence:    Fear of current or ex partner: Not on file    Emotionally abused: Not on file    Physically abused: Not on file    Forced sexual activity: Not on file  Other Topics Concern  . Not on file  Social History Narrative  . Not on file    Past Surgical History:  Procedure Laterality Date  . CESAREAN SECTION     x2  . CHOLECYSTECTOMY    . CHOLECYSTECTOMY, LAPAROSCOPIC  06/02/2015   [place]  . ECTOPIC PREGNANCY SURGERY    . OVARIAN CYST SURGERY  1988   Left  . ROBOTIC ASSISTED BILATERAL SALPINGO OOPHERECTOMY  N/A 05/21/2017   Procedure: XI ROBOTIC ASSIST  LEFT SALPINGO OOPHORECTOMY, LYSIS OF ADHESIONS;  Surgeon: Nancy Marus, MD;  Location: [facility];  Service: Gynecology;  Laterality: N/A;    Family History  Problem Relation Age of Onset  . Cancer Father        colon  . Diabetes Father   . Cancer Brother        bone   . Diabetes Brother   . Diabetes Brother     Allergies  Allergen Reactions  . Asa [Aspirin] Anaphylaxis and Rash    Current Outpatient Medications on File Prior to Visit  Medication Sig Dispense Refill  . clotrimazole-betamethasone (LOTRISONE) cream Apply 1 application topically 2 (two) times daily. 30 g 1  . levothyroxine (SYNTHROID, LEVOTHROID) 25 MCG tablet Take 1 tablet (25 mcg total) by mouth daily before breakfast. 30 tablet 3  . losartan-hydrochlorothiazide (HYZAAR) 50-12.5 MG tablet Take 1 tablet by mouth daily. 90  tablet 1  . metFORMIN (GLUCOPHAGE) 1000 MG tablet Take 1 tablet (1,000 mg total) by mouth 2 (two) times daily with a meal. 180 tablet 3  . nystatin cream (MYCOSTATIN) Apply 1 application topically 2 (two) times daily. 30 g 1  . saxagliptin HCl (ONGLYZA) 5 MG TABS tablet Take 1 tablet (5 mg total) by mouth daily. 30 tablet 3   No current facility-administered medications on file prior to visit.     BP 127/78   Pulse 86   Temp 98.1 F (36.7 C) (Oral)   Resp 16   Ht 5' 3.5" (1.613 m)   Wt 212 lb 3.2 oz (96.3 kg)   SpO2 98%   BMI 37.00 kg/m       Objective:   Physical Exam  General Mental Status- Alert. General Appearance- Not in acute distress.   Skin General: Color- Normal Color. Moisture- Normal Moisture.  Neck Carotid Arteries- Normal color. Moisture- Normal Moisture. No carotid bruits. No JVD.  Chest and Lung Exam Auscultation: Breath Sounds:-Normal.  Cardiovascular Auscultation:Rythm- Regular. Murmurs & Other Heart Sounds:Auscultation of the heart reveals- No Murmurs.  Abdomen Inspection:-Inspeection Normal. Palpation/Percussion:Note:No mass. Palpation and Percussion of the abdomen reveal- Non Tender, Non Distended + BS, no rebound or guarding.    Neurologic Cranial Nerve exam:- CN III-XII intact(No nystagmus), symmetric smile. Strength:- 5/5 equal and symmetric strength both upper and lower extremities.  Lower ext- feet. See quality metrics.  Rectal- 2 small hemorrhoid. Mild inflamed and tender. In process of early thrombosis possible. No bleeding presently. Location of hemorhoids 12 oclock.      Assessment & Plan:  For hemorrhoids will rx anusol hc suppository to use daily. Also can do sitz bath daily. Will refer to specialist to evaluate recurrent hemorrhoid flare. Rx norco for severe pain if needed  For diabetes will get a1c and cmp. Continue metformin and may prescribe januvia or glipizide. Depends on results of labs.   Get tsh today and then  refill thyroid med.  Blood pressure is well controlled today.  Continue current medication regimen.  Follow up in 3 months or as needed  40 minutes spent with patient today.  50% time spent counseling answering questions regarding  on hemorrhoids as well as her diabetes management.  Particularly spent counseling on different medication options, mechanism of actions and potential side effects of medications.    Mackie Pai, PA-C

## 2018-11-20 ENCOUNTER — Telehealth: Payer: Self-pay | Admitting: Medical

## 2018-11-20 MED ORDER — GLIPIZIDE 10 MG PO TABS: 10.0000 mg | ORAL_TABLET | Freq: Every day | ORAL | 3 refills | Status: DC

## 2018-11-20 MED FILL — glipiZIDE 10 MG TABS: 10 | 30 days supply | Qty: 30 | Fill #0

## 2018-11-20 NOTE — Telephone Encounter (Signed)
Patient advised ( in spanish) of new recommendations and to pick up Glipizide.

## 2018-11-20 NOTE — Telephone Encounter (Signed)
Since she declines insulin then eat low sugar diet, make sure taking metformin twice daily, and will add glipizide.   If she ever sees sugar over 400 then be seen in ED.  If sugars not improving/alot better on follow up then would recommend endocrinologist referral as they might have samples since her new insurance plan will require her to pay 25% cost of all meds.

## 2018-11-20 NOTE — Telephone Encounter (Signed)
Pt came in office stating was called from our office giving her info about her lab results done yesterday 11-19-2018, Pt stated does not want to start lantus but that she wants to start a diet and would take her metformin how she is supposed to take it (Pt mentioned that she is supposed to take metformin twice a day and she has been taking only 1 pill a day- pt will start take it twice a day and her other meds and will do a diet also to see if in 3 months her levels can come back to normal) Pt also mention if she can have her ultrasound done in 3 months since she has a new insurance and does not want to pay a lot for deductible. Please advise. Pt Tel [phone].-Need someone in [place].

## 2018-12-09 ENCOUNTER — Other Ambulatory Visit (INDEPENDENT_AMBULATORY_CARE_PROVIDER_SITE_OTHER)

## 2018-12-09 ENCOUNTER — Ambulatory Visit (INDEPENDENT_AMBULATORY_CARE_PROVIDER_SITE_OTHER): Admitting: Gastroenterology

## 2018-12-09 ENCOUNTER — Encounter: Payer: Self-pay | Admitting: Gastroenterology

## 2018-12-09 VITALS — BP 134/88 | HR 98 | Ht 64.0 in | Wt 209.1 lb

## 2018-12-09 DIAGNOSIS — K649 Unspecified hemorrhoids: Secondary | ICD-10-CM | POA: Diagnosis not present

## 2018-12-09 DIAGNOSIS — K921 Melena: Secondary | ICD-10-CM | POA: Diagnosis not present

## 2018-12-09 DIAGNOSIS — K5900 Constipation, unspecified: Secondary | ICD-10-CM | POA: Diagnosis not present

## 2018-12-09 DIAGNOSIS — K760 Fatty (change of) liver, not elsewhere classified: Secondary | ICD-10-CM | POA: Diagnosis not present

## 2018-12-09 MED ORDER — AMBULATORY NON FORMULARY MEDICATION: 1 refills | Status: DC

## 2018-12-09 MED ORDER — SUPREP BOWEL PREP KIT 17.5-3.13-1.6 GM/177ML PO SOLN: 1.0000 | ORAL | 0 refills | Status: DC

## 2018-12-09 NOTE — Patient Instructions (Addendum)
If you are age 52 or older, your body mass index should be between 23-30. Your Body mass index is 35.9 kg/m. If this is out of the aforementioned range listed, please consider follow up with your Primary Care Provider.  If you are age 47 or younger, your body mass index should be between 19-25. Your Body mass index is 35.9 kg/m. If this is out of the aformentioned range listed, please consider follow up with your Primary Care Provider.   We have sent the following medications to your pharmacy for you to pick up at your convenience: Nitroglycerin 0.125% twice daily for 6 weeks.   Increase your water intake.   Please purchase the following medications over the counter and take as directed: Fiber Supplement.   Contenido de [place] de los alimentos (Cardinal Health Content in Foods) Consulte la lista a continuacin para Armed forces logistics/support/administrative officer contenido de fibra de algunos alimentos que se consumen con frecuencia. ALIMENTOS CON ALTO CONTENIDO DE FIBRA Los alimentos ricos en fibra contienen 4gramos o ms de fibra por porcin. Estos incluyen los siguientes:  Alcachofa (fresca): 34mediana tiene 10,3g de [place].  Frijoles cocidos, comunes o vegetarianos (en lata), taza tiene 5,2g de fibra.  Arndanos o frambuesas (frescas): taza tiene 4g de fibra.  Cereal de salvado: taza tiene 8,6g de fibra.  Trigo burgol (cocido): taza tiene 4g de [place].  Frijoles (en lata): taza tiene 6,8g de fibra.  Lentejas (cocidas): taza tiene 7,8g de fibra.  Pera (fresca): 31mediana tiene 5,1g de [place].  Guisantes (congelados): taza tiene 4,4g de [place].  Frijoles pintos (en lata): taza tiene 5,5g de fibra.  Frijoles pintos (secos y cocidos): taza tiene 7,7g de [place].  Papa con piel (al horno): 15mediana tiene 4,4g de [place].  Quinua (cocida): taza tiene 5g de fibra.  Porotos de soja (en lata, congelados o frescos): taza tiene 5,1g de fibra. ALIMENTOS CON CONTENIDO MODERADO DE FIBRA Los  alimentos con contenido moderado de [place] de 1 a 4gramos de fibra por porcin. Estos incluyen los siguientes:  Almendras: 1onza tiene 3,5g de [place].  Manzana con piel: 68mediana tiene 3,3g de fibra.  Pur de Firefighter (endulzado): taza tiene 1,5g de Pharmacist, hospital.  Bagel solo: un bagel de 4pulgadas (10cm) tiene 2g de fibra.  Banana: 79mediana tiene 3,1g de fibra.  Brcoli (cocido): taza tiene 2,5g de [place].  Zanahorias (cocidas): taza tiene 2,3g de [place].  Maz (en lata o congelado): taza tiene 2,1g de [place].  Tortilla de maz: una tortilla de 6pulgadas (15cm) tiene 1,5g de [place].  Judas verdes (en lata): taza tiene 2g de fibra.  Avena instantnea: taza tiene aproximadamente 2g de fibra.  Arroz integral de Set designer (cocido): 1taza tiene 3,5g de Pharmacist, hospital.  Macarrones enriquecidos (cocidos): 1 taza tiene 2,5g de [place].  Meln: 1taza tiene 1,4g de fibra.  Multicereales: taza tiene aproximadamente 2 a 4g de fibra.  Naranja: 1pequea tiene 3,1g de fibra.  Pur de papas: taza tiene 1,6g de fibra.  Pasas: taza tiene 1,6g de fibra.  Calabaza: taza tiene 2,9g de [place].  Semillas de girasol: taza tiene 1,1g de [place].  Tomate: 62mediano tiene 1,5g de fibra.  Hamburguesa de vegetales o de soja: 1 tiene 3,4g de [place].  Pan de salvado: 1rebanada tiene 2g de [place].  Espaguetis de salvado: taza tiene 3,2g de fibra. ALIMENTOS CON BAJO CONTENIDO DE FIBRA Los alimentos con bajo contenido de [place] tienen menos de 1gramo de fibra por porcin. Estos incluyen los siguientes:  Huevo: 1grande.  Tortilla de harina: una tortilla de  6pulgadas (15cm).  Jugo de fruta: taza.  Valeda MalmSharen Counter.  Carne de res, ave o pescado: 1onza.  Leche: 1taza.  Espinaca (cruda): 1taza.  Pan blanco: 1rebanada.  Arroz blanco: taza.  Yogur: taza. Las cantidades reales de fibra de los alimentos pueden ser diferentes en funcin  del procesamiento. Hable con el nutricionista sobre la cantidad de fibra que necesita en la dieta. Esta informacin no tiene Marine scientist el consejo del mdico. Asegrese de hacerle al mdico cualquier pregunta que tenga. Document Released: 02/02/2013 Document Revised: 12/01/2015 Document Reviewed: 12/01/2015 Elsevier Interactive Patient Education  2019 [place] have been scheduled for a colonoscopy. Please follow written instructions given to you at your visit today.  Please pick up your prep supplies at the pharmacy within the next 1-3 days. If you use inhalers (even only as needed), please bring them with you on the day of your procedure. Your physician has requested that you go to www.startemmi.com and enter the access code given to you at your visit today. This web site gives a general overview about your procedure. However, you should still follow specific instructions given to you by our office regarding your preparation for the procedure.  We have sent a prescription for nitroglycerin 0.125% gel to [facility]. You should apply a pea size amount to your rectum two times daily x 6 weeks.  [facility]'s information is below: Address: [address], [place], [place] [address]  [phone]  *Please DO NOT go directly from our office to pick up this medication! Give the pharmacy 1 day to process the prescription as this is compounded at takes time to make.  We have placed a Nutrition Referral you will be getting a call with this appointment.    Please go to the lab on the 2nd floor [address] before you leave the office today.   It was a pleasure to see you today!  Vito Cirigliano, D.O.

## 2018-12-09 NOTE — Progress Notes (Signed)
Chief Complaint: Hematochezia, hemorrhoids   Referring Provider:     Mackie Pai, PA-C    HPI:     Deborah Henderson is a 53 y.o. female with a history of diabetes (A1c 11.7), hypertension, hypothyroidism, diverticulosis, referred to the [facility]astro[facility]ology Clinic for evaluation of history of hemorrhoids and intermittent hematochezia.  She states (via interpreter) hemorrhoids have been increasingly bothersome. BRBPR with BMs along with burning sensation and low back pain when sxs occur. Not currently inflammed but when has a BM hemorrhoids will protrude. Sxs started 4 months ago but have been gradually worsening, in particular in the lasy month. No improvement with Anusol, Sitz bath. Has tried soaking in tub of ice water. Burning starts after BM and can last through the day.  Burning pain does not tend to be independent of BM.  No n/v/f/c. +straining with longer hx of constipation and has taken Miralax approx 4 years ago, and has started taking again. No recent changes in medications, Abx, etc.   Was seen by [facility] for this issue on 11/19/2018.  Rectal exam notable for 2 small hemorrhoids at the 12 o'clock position.  Was treated with Anusol HC suppository and recommendation for sitz bath and referral to GI.  Was also given an Rx for Norco for severe pain as needed.  CCY in 2016 for cholelithiasis. RUQ in 2016 with fatty liver infiltarion and GB stones.   Separately, recent labs notable for elevated liver enzymes with AST/ALT 167/236.  Previously 53/94 and 96/102 in 2019 and had been mildly elevated since 02/2017.  ALP mildly elevated at 144, previously normal.  Normal albumin, bilirubin, protein.  Hemoglobin A1c 11.7 aside from elevated BG, normal BMP.  Normal CBC in 12/2017.  Ordered RUQ ultrasound; not yet done.  Previous CT abdomen/pelvis in 03/2017 n/f moderate hepatic steatosis without focal liver lesion, ccy, no duct dilatation, normal pancreas and spleen.  Has since made  dietary modifications with intentional 3 pound weight loss in last couple weeks.  Endoscopic history: -Colonoscopy (01/2015, [place]): External hemorrhoids, pandiverticulosis, mild proctitis (bxs benign colonic mucosa), internal hemorrhoids.  Suboptimal bowel prep.   Past Medical History:  Diagnosis Date  . Diabetes mellitus ([place])    type 2  . Diverticulosis   . Hypertension   . Hypothyroidism      Past Surgical History:  Procedure Laterality Date  . CESAREAN SECTION     x2  . CHOLECYSTECTOMY    . CHOLECYSTECTOMY, LAPAROSCOPIC  06/02/2015   [place]  . COLONOSCOPY  02/04/2015   due 2021   . ECTOPIC PREGNANCY SURGERY    . OVARIAN CYST SURGERY  1988   Left  . ROBOTIC ASSISTED BILATERAL SALPINGO OOPHERECTOMY N/A 05/21/2017   Procedure: XI ROBOTIC ASSIST  LEFT SALPINGO OOPHORECTOMY, LYSIS OF ADHESIONS;  Surgeon: Nancy Marus, MD;  Location: [facility];  Service: Gynecology;  Laterality: N/A;   Family History  Problem Relation Age of Onset  . Cancer Father        colon  . Diabetes Father   . Cancer Brother        bone   . Diabetes Brother   . Diabetes Brother    Social History   Tobacco Use  . Smoking status: Former Smoker    Last attempt to quit: 11/09/1989    Years since quitting: 29.1  . Smokeless tobacco: Never Used  . Tobacco comment: Quit >20 years ago  Substance Use  Topics  . Alcohol use: No  . Drug use: No   Current Outpatient Medications  Medication Sig Dispense Refill  . clotrimazole-betamethasone (LOTRISONE) cream Apply 1 application topically 2 (two) times daily. 30 g 1  . glipiZIDE (GLUCOTROL) 10 MG tablet Take 1 tablet (10 mg total) by mouth daily before breakfast. 30 tablet 3  . levothyroxine (SYNTHROID, LEVOTHROID) 25 MCG tablet Take 1 tablet (25 mcg total) by mouth daily before breakfast. 30 tablet 3  . losartan-hydrochlorothiazide (HYZAAR) 50-12.5 MG tablet Take 1 tablet by mouth daily. 90 tablet 1  . metFORMIN (GLUCOPHAGE)  1000 MG tablet Take 1 tablet (1,000 mg total) by mouth 2 (two) times daily with a meal. 180 tablet 3  . nystatin cream (MYCOSTATIN) Apply 1 application topically 2 (two) times daily. 30 g 1   No current facility-administered medications for this visit.    Allergies  Allergen Reactions  . Asa [Aspirin] Anaphylaxis and Rash     Review of Systems: All systems reviewed and negative except where noted in HPI.     Physical Exam:    Wt Readings from Last 3 Encounters:  12/09/18 209 lb 2 oz (94.9 kg)  11/19/18 212 lb 3.2 oz (96.3 kg)  09/04/18 218 lb (98.9 kg)    Ht 5\' 4"  (1.626 m)   Wt 209 lb 2 oz (94.9 kg)   BMI 35.90 kg/m  Constitutional:  Pleasant, in no acute distress. Psychiatric: Normal mood and affect. Behavior is normal. EENT: Pupils normal.  Conjunctivae are normal. No scleral icterus. Neck supple. No cervical LAD. Cardiovascular: Normal rate, regular rhythm. No edema Pulmonary/chest: Effort normal and breath sounds normal. No wheezing, rales or rhonchi. Abdominal: Soft, nondistended, nontender. Bowel sounds active throughout. There are no masses palpable. No hepatomegaly. Neurological: Alert and oriented to person place and time. Skin: Skin is warm and dry. No rashes noted. Rectal exam: Sensation intact and preserved anal wink. No external anal fissures, external hemorrhoids or skin tags. Normal sphincter tone. Anoscopy with large internal hemorrhoids and tenderness without noted internal hemorrhoid. Limited digital exam due to tenderness. No blood on the exam glove. (Patient's husband and Spanish interpretor present for exam as chaperones).    ASSESSMENT AND PLAN;   Deborah Henderson is a 53 y.o. female presenting with:  1) Hematochezia: Clinical presentation seems most consistent with benign anorectal disorder (i.e. hemorrhoids and possible anal fissure).  However, given duration of symptoms, age, previous finding of ?  Proctitis, recommend colonoscopy to evaluate  for more proximal etiology.  Also plan to evaluate for size of internal hemorrhoids to help guide further treatment plan.  - Given increased tenderness on digital exam and anoscopy, and no clinical response to Anusol, will trial course of NTG 0.125% BID for possible internal anal fissure - Colonoscopy - Increased dietary fiber and add fiber supplement - Increase fluid intake - Treat constipation as below - Hemorrhoid banding pending endoscopic findings  2) Constipation: Discussed the potential etiologies for constipation at length with the patient to include potential treatment and diagnostic options, and will proceed with the following:  - Increase dietary fiber to goal 25-30 gm/day - Increase daily water/fluid intake to at least eight 8 oz glasses of water/fluid per day  - Can add supplemental fiber. Provided with handout on types of supplemental fibers. Recommend OTC Citrucel (or generic equivalent) and titrate up to BID for goal of regular, soft stools without straining  - Ok to continue laxative agent (ie, Miralax) daily for now, and titrate to regular,  soft stools without straining, with eventual goal to stop chronic laxative use   - Discussed use of stool softeners (ie, Colace) in both short term and long term setting of constipation - Reviewed appropriate toilet hygiene, and cautioned to avoid prolonged time on toilet and straining to have BM  - If no improvement, will consider further evaluation with either Sitz marker study and/or anorectal manometry  - If no or inadequate response to therapy, and if additional studies above are unrevealing, can trial course of additional medical management  3) Fatty liver: - Discussed labs and imaging today and she reports she also discussed this dx with referring provider.  Clinical presentation (history of diabetes, hypertension, body habitus) along with serologic and radiographic findings most suggestive of NAFLD.   -Plan for diet/exercise,  modest weight loss and aggressive tx of underlying DM and HTN. - Check viral hep panel today - Repeat LAEs in 1 month. If still elevated, low threshold for extended serologic evaluation to r/o concomitant liver disease - Nutrition referral -If LAEs downtrending, repeat in 6 months  The indications, risks, and benefits of colonoscopy were explained to the patient in detail. Risks include but are not limited to bleeding, perforation, adverse reaction to medications, and cardiopulmonary compromise. Sequelae include but are not limited to the possibility of surgery, hospitalization, and mortality. The patient verbalized understanding and wished to proceed. All questions answered, referred to the scheduler and bowel prep ordered. Further recommendations pending results of the exam.     Lavena Bullion, DO, FACG  12/09/2018, 10:45 AM   Saguier, Percell Miller, PA-C

## 2018-12-10 LAB — HEPATITIS C ANTIBODY
Hepatitis C Ab: NONREACTIVE
SIGNAL TO CUT-OFF: 0.02 (ref <1.00)

## 2018-12-10 LAB — HEPATITIS B SURFACE ANTIGEN: HEP B S AG: NONREACTIVE

## 2018-12-10 LAB — HEPATITIS B SURFACE ANTIBODY,QUALITATIVE: HEP B S AB: REACTIVE — AB

## 2018-12-10 NOTE — Addendum Note (Signed)
Addended by: Herma Mering D on: 12/10/2018 02:21 PM   Modules accepted: Orders

## 2018-12-25 ENCOUNTER — Encounter: Payer: Self-pay | Admitting: Gastroenterology

## 2018-12-25 ENCOUNTER — Telehealth: Payer: Self-pay | Admitting: Gastroenterology

## 2018-12-25 ENCOUNTER — Ambulatory Visit (AMBULATORY_SURGERY_CENTER): Admitting: Gastroenterology

## 2018-12-25 ENCOUNTER — Other Ambulatory Visit: Payer: Self-pay

## 2018-12-25 VITALS — BP 118/69 | HR 55 | Temp 97.7°F | Resp 16 | Ht 64.0 in | Wt 209.0 lb

## 2018-12-25 DIAGNOSIS — K921 Melena: Secondary | ICD-10-CM

## 2018-12-25 DIAGNOSIS — K5900 Constipation, unspecified: Secondary | ICD-10-CM

## 2018-12-25 DIAGNOSIS — D122 Benign neoplasm of ascending colon: Secondary | ICD-10-CM

## 2018-12-25 DIAGNOSIS — K648 Other hemorrhoids: Secondary | ICD-10-CM

## 2018-12-25 DIAGNOSIS — K621 Rectal polyp: Secondary | ICD-10-CM | POA: Diagnosis not present

## 2018-12-25 DIAGNOSIS — D128 Benign neoplasm of rectum: Secondary | ICD-10-CM

## 2018-12-25 DIAGNOSIS — D125 Benign neoplasm of sigmoid colon: Secondary | ICD-10-CM

## 2018-12-25 DIAGNOSIS — D120 Benign neoplasm of cecum: Secondary | ICD-10-CM

## 2018-12-25 DIAGNOSIS — K5730 Diverticulosis of large intestine without perforation or abscess without bleeding: Secondary | ICD-10-CM | POA: Diagnosis not present

## 2018-12-25 DIAGNOSIS — K641 Second degree hemorrhoids: Secondary | ICD-10-CM

## 2018-12-25 DIAGNOSIS — K635 Polyp of colon: Secondary | ICD-10-CM

## 2018-12-25 MED ORDER — SODIUM CHLORIDE 0.9 % IV SOLN: 500.0000 mL | Freq: Once | INTRAVENOUS | Status: DC

## 2018-12-25 NOTE — Telephone Encounter (Signed)
Colonoscopy was completed today and okay to schedule for banding.  Thank you

## 2018-12-25 NOTE — Op Note (Signed)
[place] Patient Name: Deborah Henderson Procedure Date: 12/25/2018 10:59 AM MRN: [id number] Endoscopist: Gerrit Heck , MD Age: 53 Referring MD:  Date of Birth: 08-May-1966 Gender: Female Account #: [id number] Procedure:                Colonoscopy Indications:              Hematochezia, Constipation Medicines:                Monitored Anesthesia Care Procedure:                Pre-Anesthesia Assessment:                           - Prior to the procedure, a History and Physical                            was performed, and patient medications and                            allergies were reviewed. The patient's tolerance of                            previous anesthesia was also reviewed. The risks                            and benefits of the procedure and the sedation                            options and risks were discussed with the patient.                            All questions were answered, and informed consent                            was obtained. Prior Anticoagulants: The patient has                            taken no previous anticoagulant or antiplatelet                            agents. ASA Grade Assessment: III - A patient with                            severe systemic disease. After reviewing the risks                            and benefits, the patient was deemed in                            satisfactory condition to undergo the procedure.                           After obtaining informed consent, the colonoscope  was passed under direct vision. Throughout the                            procedure, the patient's blood pressure, pulse, and                            oxygen saturations were monitored continuously. The                            Colonoscope was introduced through the anus and                            advanced to the the cecum, identified by                            appendiceal orifice and ileocecal  valve. The                            colonoscopy was technically difficult and complex                            due to significant looping and a tortuous colon.                            Successful completion of the procedure was aided by                            changing the patient to a supine position and                            applying abdominal pressure. The patient tolerated                            the procedure well. The quality of the bowel                            preparation was adequate. The ileocecal valve,                            appendiceal orifice, and rectum were photographed. Scope In: 11:10:55 AM Scope Out: 11:40:59 AM Scope Withdrawal Time: 0 hours 21 minutes 14 seconds  Total Procedure Duration: 0 hours 30 minutes 4 seconds  Findings:                 Hemorrhoids were found on perianal exam.                           A 11 mm polyp was found in the cecum. The polyp was                            sessile. The polyp was removed with a hot snare.  Resection and retrieval were complete. Estimated                            blood loss: none.                           A 8 mm polyp was found in the ascending colon. The                            polyp was sessile. The polyp was removed with a hot                            snare. Resection and retrieval were complete.                           A 8 mm polyp was found in the transverse colon. The                            polyp was sessile. The polyp was removed with a                            cold snare. Resection and retrieval were complete.                            Estimated blood loss was minimal.                           Two sessile polyps were found in the rectum and                            sigmoid colon. The polyps were 4 to 5 mm in size.                            These polyps were removed with a cold snare.                            Resection and retrieval were  complete. Estimated                            blood loss was minimal.                           Multiple small and large-mouthed diverticula were                            found in the sigmoid colon, descending colon,                            transverse colon and ascending colon.                           Non-bleeding internal hemorrhoids were found during  retroflexion. The hemorrhoids were medium-sized.                           The sigmoid colon and ascending colon revealed                            significantly excessive looping. Advancing the                            scope required changing the patient to a supine                            position and applying abdominal pressure. Complications:            No immediate complications. Estimated Blood Loss:     Estimated blood loss was minimal. Impression:               - Hemorrhoids found on perianal exam.                           - One 11 mm polyp in the cecum, removed with a hot                            snare. Resected and retrieved.                           - One 8 mm polyp in the ascending colon, removed                            with a hot snare. Resected and retrieved.                           - One 8 mm polyp in the transverse colon, removed                            with a cold snare. Resected and retrieved.                           - Two 4 to 5 mm polyps in the rectum and in the                            sigmoid colon, removed with a cold snare. Resected                            and retrieved.                           - Diverticulosis in the sigmoid colon, in the                            descending colon, in the transverse colon and in                            the ascending colon.                           -  Non-bleeding internal hemorrhoids.                           - There was significant looping of the colon. Recommendation:           - Patient has a contact number  available for                            emergencies. The signs and symptoms of potential                            delayed complications were discussed with the                            patient. Return to normal activities tomorrow.                            Written discharge instructions were provided to the                            patient.                           - Resume previous diet today.                           - Continue present medications.                           - Await pathology results.                           - Repeat colonoscopy for surveillance based on                            pathology results.                           - Return to GI clinic PRN.                           - Internal hemorrhoids were noted on this study and                            may be amenable to hemorrhoid band ligation. If you                            are interested in further treatment of these                            hemorrhoids with band ligation, please contact my                            clinic to set up an appointment for evaluation and  treatment. Gerrit Heck, MD 12/25/2018 11:50:34 AM

## 2018-12-25 NOTE — Telephone Encounter (Signed)
Please review previous message and advise 

## 2018-12-25 NOTE — Progress Notes (Signed)
To PACU, VSS. Report to [email or web address] 

## 2018-12-25 NOTE — Patient Instructions (Signed)
Discharge instructions given. Handouts on polyps,diverticulosis and hemorrhoids. Resume previous medications. YOU HAD AN ENDOSCOPIC PROCEDURE TODAY AT THE [facility]:   Refer to the procedure report that was given to you for any specific questions about what was found during the examination.  If the procedure report does not answer your questions, please call your gastroenterologist to clarify.  If you requested that your care partner not be given the details of your procedure findings, then the procedure report has been included in a sealed envelope for you to review at your convenience later.  YOU SHOULD EXPECT: Some feelings of bloating in the abdomen. Passage of more gas than usual.  Walking can help get rid of the air that was put into your GI tract during the procedure and reduce the bloating. If you had a lower endoscopy (such as a colonoscopy or flexible sigmoidoscopy) you may notice spotting of blood in your stool or on the toilet paper. If you underwent a bowel prep for your procedure, you may not have a normal bowel movement for a few days.  Please Note:  You might notice some irritation and congestion in your nose or some drainage.  This is from the oxygen used during your procedure.  There is no need for concern and it should clear up in a day or so.  SYMPTOMS TO REPORT IMMEDIATELY:   Following lower endoscopy (colonoscopy or flexible sigmoidoscopy):  Excessive amounts of blood in the stool  Significant tenderness or worsening of abdominal pains  Swelling of the abdomen that is new, acute  Fever of 100F or higher   For urgent or emergent issues, a gastroenterologist can be reached at any hour by calling [phone].   DIET:  We do recommend a small meal at first, but then you may proceed to your regular diet.  Drink plenty of fluids but you should avoid alcoholic beverages for 24 hours.  ACTIVITY:  You should plan to take it easy for the rest of today and you  should NOT DRIVE or use heavy machinery until tomorrow (because of the sedation medicines used during the test).    FOLLOW UP: Our staff will call the number listed on your records the next business day following your procedure to check on you and address any questions or concerns that you may have regarding the information given to you following your procedure. If we do not reach you, we will leave a message.  However, if you are feeling well and you are not experiencing any problems, there is no need to return our call.  We will assume that you have returned to your regular daily activities without incident.  If any biopsies were taken you will be contacted by phone or by letter within the next 1-3 weeks.  Please call us at [phone] if you have not heard about the biopsies in 3 weeks.    SIGNATURES/CONFIDENTIALITY: You and/or your care partner have signed paperwork which will be entered into your electronic medical record.  These signatures attest to the fact that that the information above on your After Visit Summary has been reviewed and is understood.  Full responsibility of the confidentiality of this discharge information lies with you and/or your care-partner. 

## 2018-12-25 NOTE — Telephone Encounter (Signed)
Pt's husband Deborah Henderson called looking to schedule banding for his wife. Per Dr. Vivia Ewing notes banding is pending procedure results. Pt's husband wants to know if it is ok to go ahead with banding. Pls advise.

## 2018-12-26 ENCOUNTER — Telehealth: Payer: Self-pay | Admitting: *Deleted

## 2018-12-26 NOTE — Telephone Encounter (Signed)
  Follow up Call-  Call back number 12/25/2018  Post procedure Call Back phone  # [phone]  Permission to leave phone message Yes  Some recent data might be hidden     Patient questions:  Do you have a fever, pain , or abdominal swelling? No. Pain Score  0 *  Have you tolerated food without any problems? Yes.    Have you been able to return to your normal activities? Yes.    Do you have any questions about your discharge instructions: Diet   No. Medications  No. Follow up visit  No.  Do you have questions or concerns about your Care? No.  Actions: * If pain score is 4 or above: No action needed, pain <4.

## 2018-12-26 NOTE — Telephone Encounter (Signed)
Deborah Henderson, Will you please call this patient and schedule his first hemorrhoid banding appt with Dr. Bryan Lemma;

## 2018-12-26 NOTE — Telephone Encounter (Signed)
Pt scheduled for 3/24 at 2:00pm.

## 2018-12-31 ENCOUNTER — Ambulatory Visit: Admitting: *Deleted

## 2019-01-01 ENCOUNTER — Other Ambulatory Visit: Payer: Self-pay | Admitting: Medical

## 2019-01-01 MED FILL — LEVOTHYROXINE 25 MCG TABLET: 25 | 30 days supply | Qty: 30 | Fill #0

## 2019-01-01 MED FILL — metFORMIN HCL 1000 MG TABS: 1000 | 90 days supply | Qty: 180 | Fill #2

## 2019-01-05 ENCOUNTER — Encounter: Payer: Self-pay | Admitting: Gastroenterology

## 2019-01-05 ENCOUNTER — Ambulatory Visit (INDEPENDENT_AMBULATORY_CARE_PROVIDER_SITE_OTHER): Admitting: Gastroenterology

## 2019-01-05 ENCOUNTER — Other Ambulatory Visit: Payer: Self-pay

## 2019-01-05 VITALS — BP 126/80 | HR 92 | Temp 97.9°F | Ht 64.0 in | Wt 207.1 lb

## 2019-01-05 DIAGNOSIS — K648 Other hemorrhoids: Secondary | ICD-10-CM | POA: Diagnosis not present

## 2019-01-05 DIAGNOSIS — K5900 Constipation, unspecified: Secondary | ICD-10-CM

## 2019-01-05 DIAGNOSIS — K760 Fatty (change of) liver, not elsewhere classified: Secondary | ICD-10-CM

## 2019-01-05 NOTE — Patient Instructions (Signed)
If you are age 52 or older, your body mass index should be between 23-30. Your Body mass index is 35.55 kg/m. If this is out of the aforementioned range listed, please consider follow up with your Primary Care Provider.  If you are age 27 or younger, your body mass index should be between 19-25. Your Body mass index is 35.55 kg/m. If this is out of the aformentioned range listed, please consider follow up with your Primary Care Provider.   HEMORRHOID BANDING PROCEDURE    FOLLOW-UP CARE   1. The procedure you have had should have been relatively painless since the banding of the area involved does not have nerve endings and there is no pain sensation.  The rubber band cuts off the blood supply to the hemorrhoid and the band may fall off as soon as 48 hours after the banding (the band may occasionally be seen in the toilet bowl following a bowel movement). You may notice a temporary feeling of fullness in the rectum which should respond adequately to plain Tylenol or Motrin.  2. Following the banding, avoid strenuous exercise that evening and resume full activity the next day.  A sitz bath (soaking in a warm tub) or bidet is soothing, and can be useful for cleansing the area after bowel movements.     3. To avoid constipation, take two tablespoons of natural wheat bran, natural oat bran, flax, Benefiber or any over the counter fiber supplement and increase your water intake to 7-8 glasses daily.    4. Unless you have been prescribed anorectal medication, do not put anything inside your rectum for two weeks: No suppositories, enemas, fingers, etc.  5. Occasionally, you may have more bleeding than usual after the banding procedure.  This is often from the untreated hemorrhoids rather than the treated one.  Don't be concerned if there is a tablespoon or so of blood.  If there is more blood than this, lie flat with your bottom higher than your head and apply an ice pack to the area. If the bleeding  does not stop within a half an hour or if you feel faint, call our office at [phone] or go to the emergency room.  6. Problems are not common; however, if there is a substantial amount of bleeding, severe pain, chills, fever or difficulty passing urine (very rare) or other problems, you should call us at [phone] or report to the nearest emergency room.  7. Do not stay seated continuously for more than 2-3 hours for a day or two after the procedure.  Tighten your buttock muscles 10-15 times every two hours and take 10-15 deep breaths every 1-2 hours.  Do not spend more than a few minutes on the toilet if you cannot empty your bowel; instead re-visit the toilet at a later time.    It was a pleasure to see you today!  Vito Cirigliano, D.O.

## 2019-01-05 NOTE — Progress Notes (Signed)
P  Chief Complaint:    Symptomatic Internal Hemorrhoids; Hemorrhoid Band Ligation  GI History: 53 y.o. female with a history of diabetes (A1c 11.7), hypertension, hypothyroidism, diverticulosis initially seen by me on 12/09/18 for symptomatic internal hemorrhoids abd constipation. Was treated with trial of topical NTG 0.125% for possible superimposed internal fissure (tender exam, but none seen/palpated). Colonoscopy in 12/2018 n/f diverticulosis, sessile serrated polyps, and internal hemorrhoids. She presents today for hemorrhoid band ligation.   Separately, recent labs notable for elevated liver enzymes with AST/ALT 167/236.  Previously 53/94 and 96/102 in 2019 and had been mildly elevated since 02/2017.  ALP mildly elevated at 144, previously normal.  Normal albumin, bilirubin, protein.  Hemoglobin A1c 11.7 aside from elevated BG, normal BMP.  Normal CBC in 12/2017.  Ordered RUQ ultrasound; not yet done.  Previous CT abdomen/pelvis in 03/2017 n/f moderate hepatic steatosis without focal liver lesion, ccy, no duct dilatation, normal pancreas and spleen.  Has since made dietary modifications with intentional 3 pound weight loss in last couple weeks.  HPI:     Patient is a 53 y.o. femalewith a history of symptomatic internal hemorrhoids presenting [facility] for follow-up and ongoing treatment. Constipation has improved with fiber and continued intermittent laxative use.   Today, she states (via interpretor) she continues to have rectal discomfort attributed to intermittently prolapsing grade 2-3 internal hemorhroid. No appreciable change with trial of NTG 0.125%, prescribed at the last appt. No recent bleeding.   The patient presents with symptomatic grade 2  hemorrhoids, unresponsive to maximal medical therapy, requesting rubber band ligation of symptomatic hemorrhoidal disease.  No change in medical or surgical history, medications, allergies, social history since last  appointment with me.  Endoscopic Hx: - Colonoscopy (12/25/2018, Dr Bryan Lemma): SSP x5 (4-11 mm), diverticulosis, internal hemorrhoids. Recommended repeat in 3 years.  -Colonoscopy (01/2015, [place]): External hemorrhoids, pandiverticulosis, mild proctitis (bxs benign colonic mucosa), internal hemorrhoids.  Suboptimal bowel prep.  Review of systems:     No chest pain, no SOB, no fevers, no urinary sx   Past Medical History:  Diagnosis Date  . Diabetes mellitus ([place])    type 2  . Diverticulosis   . Hypertension   . Hypothyroidism   . Obesity   . Sleep apnea    Currently on CPAP machine    Patient's surgical history, family medical history, social history, medications and allergies were all reviewed in Epic    Current Outpatient Medications  Medication Sig Dispense Refill  . glipiZIDE (GLUCOTROL) 10 MG tablet Take 1 tablet (10 mg total) by mouth daily before breakfast. 30 tablet 3  . levothyroxine (SYNTHROID, LEVOTHROID) 25 MCG tablet TAKE 1 TABLET (25 MCG TOTAL) BY MOUTH DAILY BEFORE BREAKFAST. 30 tablet 3  . losartan-hydrochlorothiazide (HYZAAR) 50-12.5 MG tablet Take 1 tablet by mouth daily. (Patient taking differently: Take 1 tablet by mouth daily. Taking losartan and the hydrochlorothiazide and has to take them together since they ran out at the pharmacy) 90 tablet 1  . metFORMIN (GLUCOPHAGE) 1000 MG tablet Take 1 tablet (1,000 mg total) by mouth 2 (two) times daily with a meal. 180 tablet 3  . clotrimazole-betamethasone (LOTRISONE) cream Apply 1 application topically 2 (two) times daily. (Patient not taking: Reported on 12/09/2018) 30 g 1  . nystatin cream (MYCOSTATIN) Apply 1 application topically 2 (two) times daily. (Patient not taking: Reported on 01/05/2019) 30 g 1   Current Facility-Administered Medications  Medication Dose Route Frequency Provider Last Rate Last  Dose  . 0.9 %  sodium chloride infusion  500 mL Intravenous Once Sebastian Dzik V, DO         Physical Exam:     BP 126/80   Pulse 92   Temp 97.9 F (36.6 C)   Ht 5\' 4"  (1.626 m)   Wt 207 lb 2 oz (94 kg)   BMI 35.55 kg/m   GENERAL:  Pleasant female in NAD PSYCH: : Cooperative, normal affect EENT:  conjunctiva pink, mucous membranes moist, neck supple without masses CARDIAC:  RRR, no murmur heard, no peripheral edema PULM: Normal respiratory effort, lungs CTA bilaterally, no wheezing ABDOMEN:  Nondistended, soft, nontender. No obvious masses, no hepatomegaly,  normal bowel sounds SKIN:  turgor, no lesions seen Musculoskeletal:  Normal muscle tone, normal strength NEURO: Alert and oriented x 3, no focal neurologic deficits Rectal exam: Sensation intact and preserved anal wink.  Grade 2 hemorrhoids noted in all positions on anoscopy.  No external anal fissures noted. Normal sphincter tone. No palpable mass. No blood on the exam glove. (Chaperone: Curlene Labrum, CMA).   IMPRESSION and PLAN:    #1.  Symptomatic internal hemorrhoids: PROCEDURE NOTE: The patient presents with symptomatic grade 2  hemorrhoids, unresponsive to maximal medical therapy, requesting rubber band ligation of symptomatic hemorrhoidal disease.  All risks, benefits and alternative forms of therapy were described and informed consent was obtained.  In the Left Lateral Decubitus position, anoscopic examination revealed grade 2 hemorrhoids in all position(s).  The anorectum was pre-medicated with RectiCare. The decision was made to band the LL internal hemorrhoid, and the Marshall was used to perform band ligation without complication.  Digital anorectal examination was then performed to assure proper positioning of the band, and to adjust the banded tissue as required.  The patient was discharged home without pain or other issues.  Dietary and behavioral recommendations were given and along with follow-up instructions.     The following adjunctive treatments were recommended:  -Resume high-fiber  diet with fiber supplement (i.e. Citrucel or Benefiber) with goal for soft stools without straining to have a BM. -Resume adequate fluid intake. - Resume laxative as currently taking  The patient will return in 4 weeks for  follow-up and possible additional banding as required. No complications were encountered and the patient tolerated the procedure well.     #2.  Constipation: Sxs well controlled on current therapy. Colonoscopy o/w unrevealing for additional luminal.mucosal etiology. No plan for Sitz marker study, etc at this time.     3) Fatty liver: Previous discussed labs and imaging. Clinical presentation (history of diabetes, hypertension, body habitus) along with serologic and radiographic findings most suggestive of NAFLD.   -Plan for diet/exercise, modest weight loss and aggressive tx of underlying DM and HTN. - Check viral hep panel today - Repeat LAEs. If still elevated, low threshold for extended serologic evaluation to r/o concomitant liver disease - Nutrition referral -If LAEs downtrending, repeat in 6 months   Lavena Bullion ,DO, FACG 01/05/2019, 11:23 AM

## 2019-01-06 IMAGING — CT CT ABD-PELV W/ CM
2 of 5 series · 16 of 46 positions shown, 18 images · IV contrast (ISOVUE)
Comparison: 03/05/2017 pelvic ultrasound. 03/04/2017 abdominal
ultrasound.

CLINICAL DATA: Evaluate for metastatic disease. Pelvic mass. Left
lower quadrant pain. History of ectopic pregnancy and cyst removal.
Prior C-sections.

EXAM:
CT ABDOMEN AND PELVIS WITH CONTRAST
TECHNIQUE: Multidetector CT imaging of the abdomen and pelvis was performed
using the standard protocol following bolus administration of
intravenous contrast.
CONTRAST:  100mL L3BYLI-WMM IOPAMIDOL (L3BYLI-WMM) INJECTION 61%

[Series 2: abd/pel with · axial · 0.95mm/px · z∈[+1308,+1753]mm · 13 of 101 slices shown, 15 images]
[im 6/101  soft-tissue]
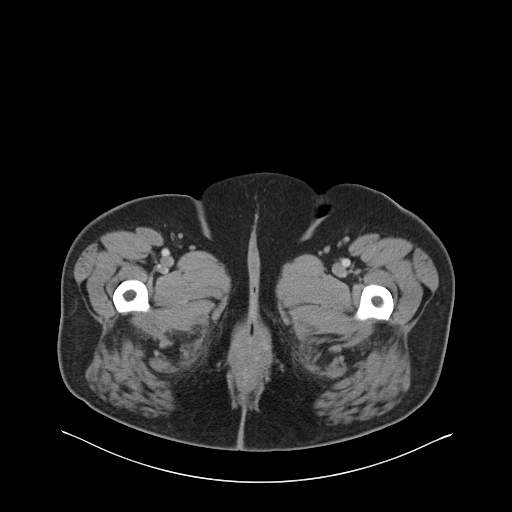
[im 6/101  bone]
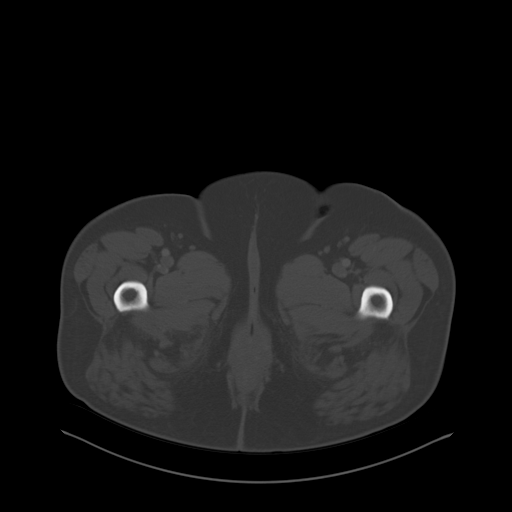
[im 12/101  soft-tissue]
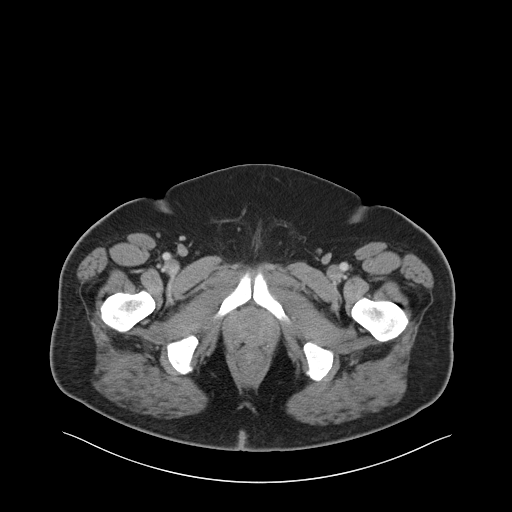
[im 24/101  soft-tissue]
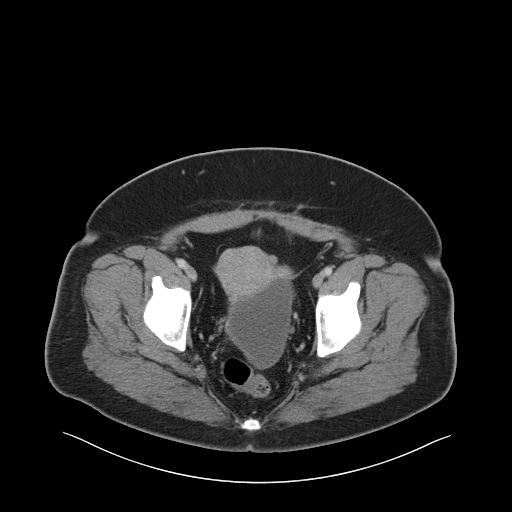
[im 30/101  soft-tissue]
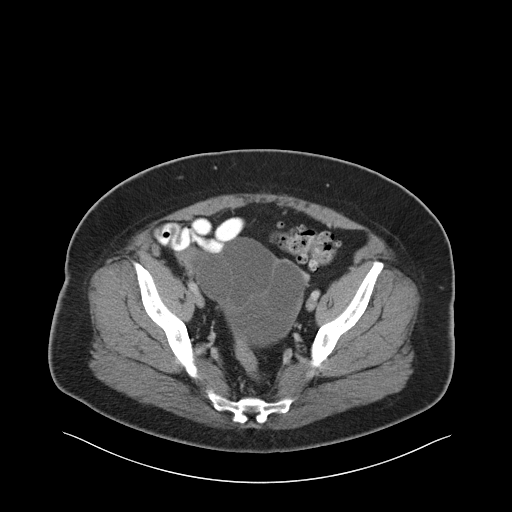
[im 36/101  soft-tissue]
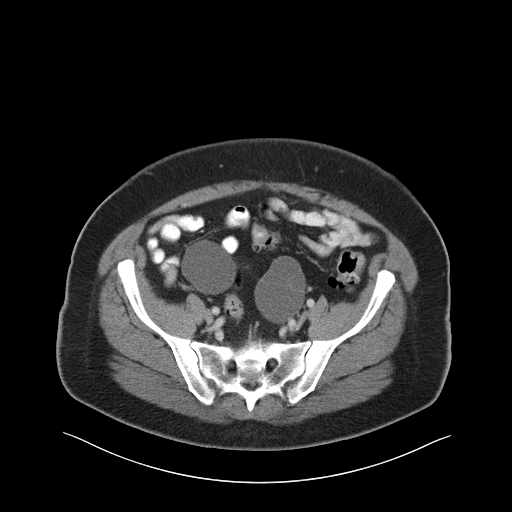
[im 42/101  soft-tissue]
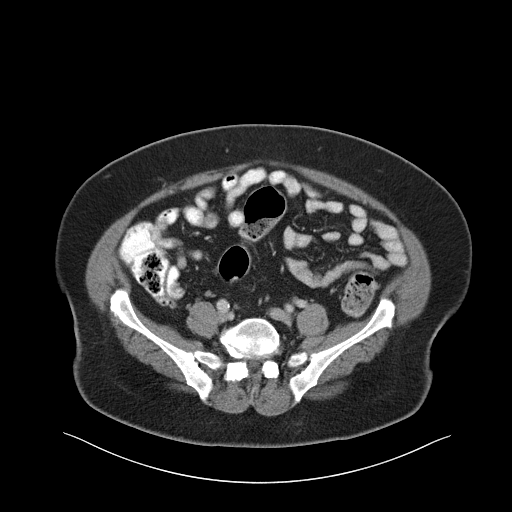
[im 53/101  soft-tissue]
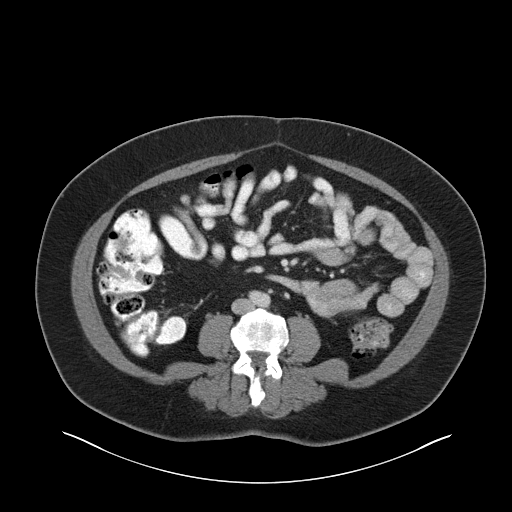
[im 59/101  soft-tissue]
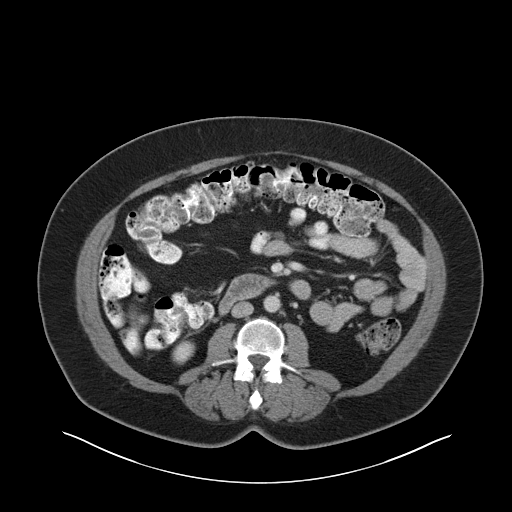
[im 65/101  soft-tissue]
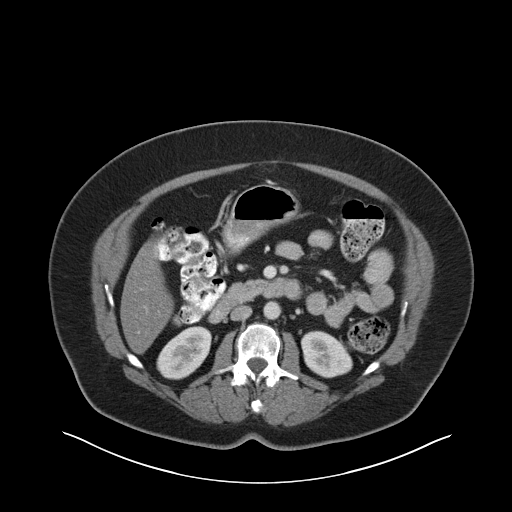
[im 65/101  bone]
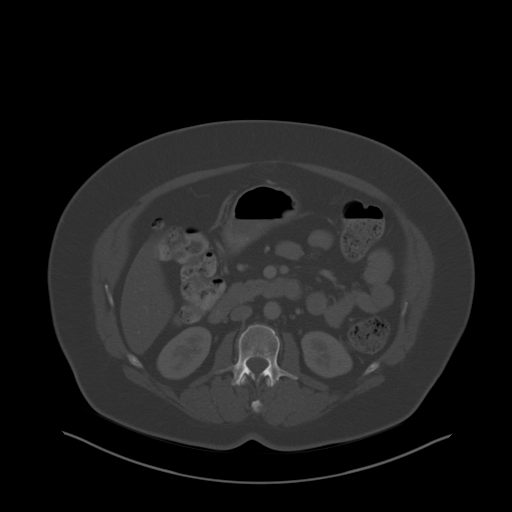
[im 71/101  soft-tissue]
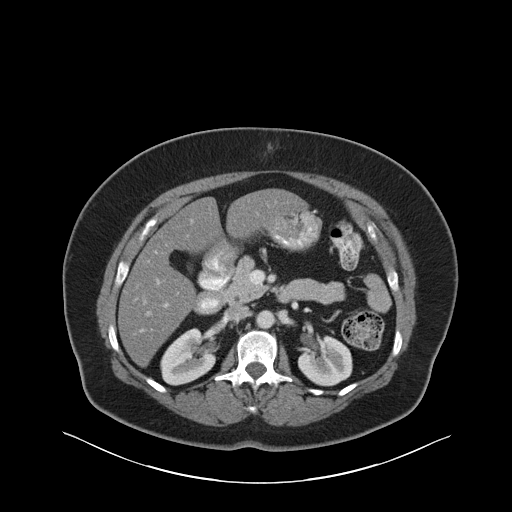
[im 77/101  soft-tissue]
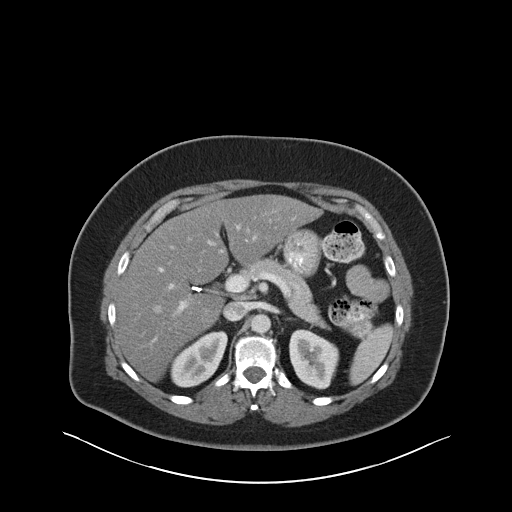
[im 89/101  soft-tissue]
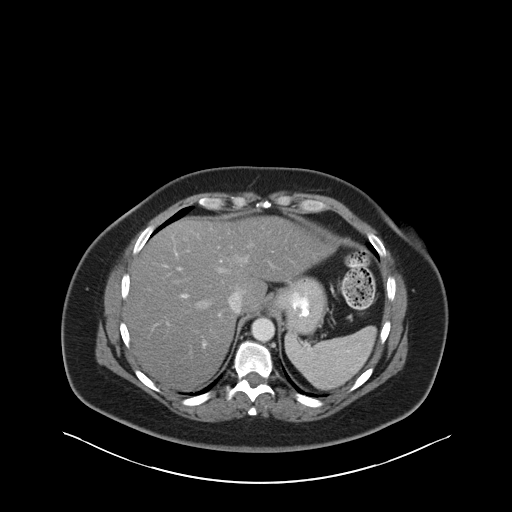
[im 95/101  soft-tissue]
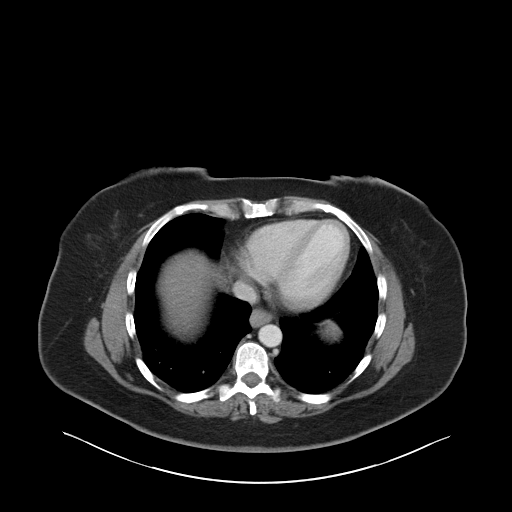

[Series 3: coronal a/|p · coronal · 0.85mm/px · 3 of 164 slices shown]
[im 55/164  soft-tissue]
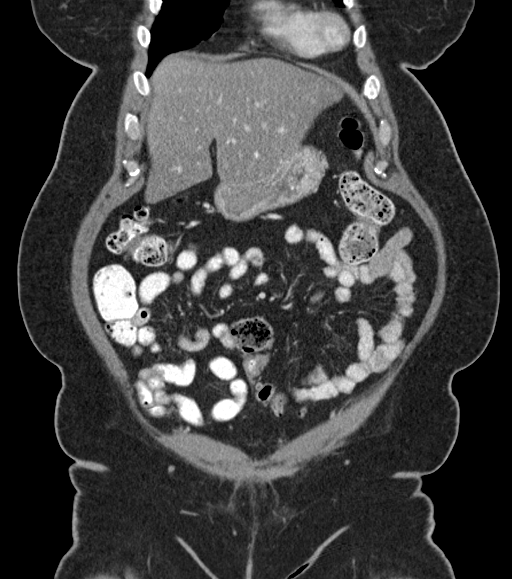
[im 73/164  soft-tissue]
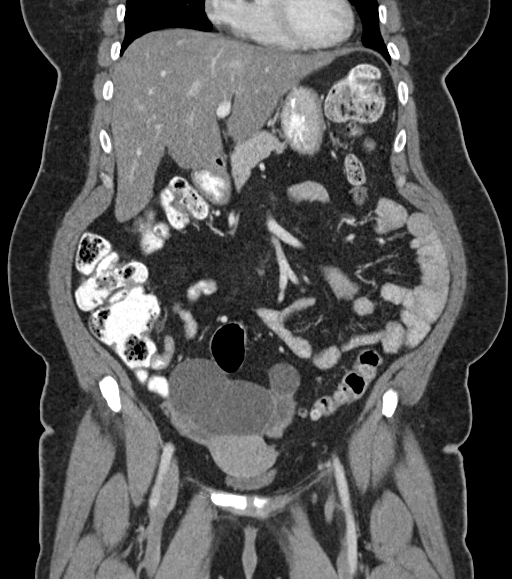
[im 91/164  soft-tissue]
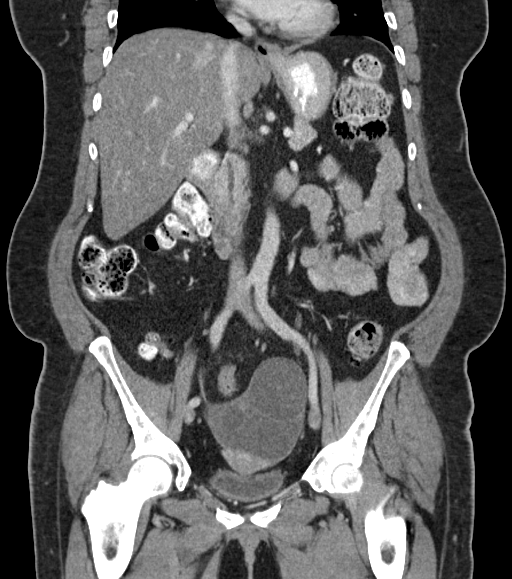

[16 of 46 positions shown; findings below may reference images not displayed]

FINDINGS: Lower chest: Clear lung bases. Borderline cardiomegaly. Small hiatal
hernia.

Hepatobiliary: Moderate hepatic steatosis, without focal liver
lesion. Cholecystectomy, without biliary ductal dilatation.

Pancreas: Normal, without mass or ductal dilatation.

Spleen: Normal in size, without focal abnormality.

Adrenals/Urinary Tract: Normal adrenal glands. Too small to
characterize lesions in both kidneys. No hydronephrosis. Normal
urinary bladder.

Stomach/Bowel: Normal remainder of the stomach. Scattered colonic
diverticula. Normal terminal ileum and appendix. Normal small bowel.

Vascular/Lymphatic: No abdominopelvic adenopathy.

Reproductive: Normal uterus. Multi septated cystic lesion is favored
to arise from the left ovary, but is relatively centrally
positioned, extending into the upper right pelvis. Measures on the
order of 10.7 x 8.1 cm on image 73/ series 2. Maximally 8.4 cm
craniocaudal on coronal image 86.

Other: No significant free fluid. Mild pelvic floor laxity. No
evidence of omental or peritoneal disease. Fat containing ventral
abdominal wall hernia is tiny.

Musculoskeletal: No acute osseous abnormality.
IMPRESSION: 1. Multi septated cystic mass within the pelvis is most consistent
with ovarian neoplasm (most likely benign), favored to arise from
the left.
2. No acute complication or evidence of metastatic disease.
3. Hepatic steatosis.
4. Small hiatal hernia.

## 2019-01-07 ENCOUNTER — Encounter: Payer: Self-pay | Admitting: Gastroenterology

## 2019-01-13 ENCOUNTER — Encounter: Admitting: Gastroenterology

## 2019-02-03 ENCOUNTER — Telehealth: Payer: Self-pay | Admitting: Gastroenterology

## 2019-02-03 NOTE — Telephone Encounter (Signed)
Left message on machine to call back  

## 2019-02-05 MED FILL — LOSARTAN POTASSIUM 50 MG TA: 50 | 90 days supply | Qty: 90 | Fill #1

## 2019-02-05 MED FILL — HYDROCHLOROTHIAZIDE 12.5 MG: 12.5 | 90 days supply | Qty: 90 | Fill #1

## 2019-02-05 MED FILL — LEVOTHYROXINE 25 MCG TABLET: 25 | 30 days supply | Qty: 30 | Fill #1

## 2019-02-06 ENCOUNTER — Encounter: Admitting: Gastroenterology

## 2019-02-10 NOTE — Telephone Encounter (Signed)
Left a message that if the patient still needs assistance to please call back

## 2019-03-25 MED FILL — LEVOTHYROXINE 25 MCG TABLET: 25 | 30 days supply | Qty: 30 | Fill #2

## 2019-03-25 MED FILL — metFORMIN HCL 1000 MG TABS: 1000 | 90 days supply | Qty: 180 | Fill #3

## 2019-04-28 MED FILL — LEVOTHYROXINE 25 MCG TABLET: 25 | 30 days supply | Qty: 30 | Fill #3

## 2019-04-29 ENCOUNTER — Telehealth: Payer: Self-pay | Admitting: Medical

## 2019-04-29 MED ORDER — LOSARTAN POTASSIUM-HCTZ 50-12.5 MG PO TABS: 1.0000 | ORAL_TABLET | Freq: Every day | ORAL | 1 refills | Status: DC

## 2019-04-29 MED FILL — LOSARTAN-HCTZ 50-12.5 MG TA: 50-12.5 | 90 days supply | Qty: 90 | Fill #0

## 2019-04-29 NOTE — Telephone Encounter (Signed)
Rx sent to pharmacy   

## 2019-04-29 NOTE — Telephone Encounter (Signed)
Pt is needing refill on losartan-hydrochlorothiazide (HYZAAR) 50-12.5 MG tablet, Deborah Henderson  please advise. Pt tel [phone].

## 2019-06-11 ENCOUNTER — Other Ambulatory Visit: Payer: Self-pay | Admitting: Medical

## 2019-06-11 MED FILL — LEVOTHYROXINE 25 MCG TABLET: 25 | 30 days supply | Qty: 30 | Fill #0

## 2019-06-16 MED FILL — glipiZIDE 10 MG TABS: 10 | 30 days supply | Qty: 30 | Fill #0

## 2019-07-23 MED FILL — glipiZIDE 10 MG TABS: 10 | 30 days supply | Qty: 30 | Fill #1

## 2019-07-23 MED FILL — LEVOTHYROXINE 25 MCG TABLET: 25 | 30 days supply | Qty: 30 | Fill #1

## 2019-07-24 ENCOUNTER — Ambulatory Visit (INDEPENDENT_AMBULATORY_CARE_PROVIDER_SITE_OTHER)

## 2019-07-24 ENCOUNTER — Other Ambulatory Visit: Payer: Self-pay

## 2019-07-24 DIAGNOSIS — Z23 Encounter for immunization: Secondary | ICD-10-CM | POA: Diagnosis not present

## 2019-08-24 MED FILL — LOSARTAN-HCTZ 50-12.5 MG TA: 50-12.5 | 90 days supply | Qty: 90 | Fill #1

## 2019-08-24 MED FILL — glipiZIDE 10 MG TABS: 10 | 30 days supply | Qty: 30 | Fill #2

## 2019-08-24 MED FILL — LEVOTHYROXINE 25 MCG TABLET: 25 | 30 days supply | Qty: 30 | Fill #2

## 2019-09-07 ENCOUNTER — Other Ambulatory Visit: Payer: Self-pay

## 2019-09-08 ENCOUNTER — Ambulatory Visit (INDEPENDENT_AMBULATORY_CARE_PROVIDER_SITE_OTHER): Admitting: Obstetrics & Gynecology

## 2019-09-08 ENCOUNTER — Encounter: Payer: Self-pay | Admitting: Obstetrics & Gynecology

## 2019-09-08 VITALS — BP 132/72 | Ht 63.75 in | Wt 212.0 lb

## 2019-09-08 DIAGNOSIS — Z6836 Body mass index (BMI) 36.0-36.9, adult: Secondary | ICD-10-CM

## 2019-09-08 DIAGNOSIS — Z01419 Encounter for gynecological examination (general) (routine) without abnormal findings: Secondary | ICD-10-CM | POA: Diagnosis not present

## 2019-09-08 DIAGNOSIS — Z780 Asymptomatic menopausal state: Secondary | ICD-10-CM

## 2019-09-08 NOTE — Progress Notes (Signed)
Deborah Henderson [birth year] [id number]   History:    53 y.o.  G3P2A1L2 Married  RP:  Established patient presenting for annual gyn exam   HPI: Robotic Left SO, Lysis of Omental Adhesions on 05/21/2017 with Dr Alycia Rossetti.  Patho:  Benign mucinous Cystadenoma.  No complication.  Menopause x 2016, well with mild occasional night sweats, declines HRT.  No PMB.  No pelvic pain. C/O dryness and pain with IC, not using a lubricant.  Urine/BMs wnl.  Breasts wnl.  BMI 36.68 improved x last year.  Not very physically active.  Decreased calories/carbs in her diet to loose weight.  Health Labs with Fam MD.  Past medical history,surgical history, family history and social history were all reviewed and documented in the EPIC chart.  Gynecologic History No LMP recorded. Patient is postmenopausal. Contraception: post menopausal status Last Pap: 08/2018. Results were: Negative Last mammogram: 08/2018. Results were: Negative Bone Density: Never Colonoscopy: 12/2018  Obstetric History OB History  Gravida Para Term Preterm AB Living  3 2     1 2   SAB TAB Ectopic Multiple Live Births      1        # Outcome Date GA Lbr Len/2nd Weight Sex Delivery Anes PTL Lv  3 Ectopic           2 Para           1 Para              ROS: A ROS was performed and pertinent positives and negatives are included in the history.  GENERAL: No fevers or chills. HEENT: No change in vision, no earache, sore throat or sinus congestion. NECK: No pain or stiffness. CARDIOVASCULAR: No chest pain or pressure. No palpitations. PULMONARY: No shortness of breath, cough or wheeze. GASTROINTESTINAL: No abdominal pain, nausea, vomiting or diarrhea, melena or bright red blood per rectum. GENITOURINARY: No urinary frequency, urgency, hesitancy or dysuria. MUSCULOSKELETAL: No joint or muscle pain, no back pain, no recent trauma. DERMATOLOGIC: No rash, no itching, no lesions. ENDOCRINE: No polyuria, polydipsia, no heat or cold intolerance. No  recent change in weight. HEMATOLOGICAL: No anemia or easy bruising or bleeding. NEUROLOGIC: No headache, seizures, numbness, tingling or weakness. PSYCHIATRIC: No depression, no loss of interest in normal activity or change in sleep pattern.     Exam:   BP 132/72   Ht 5' 3.75" (1.619 m)   Wt 212 lb (96.2 kg)   BMI 36.68 kg/m   Body mass index is 36.68 kg/m.  General appearance : Well developed well nourished female. No acute distress HEENT: Eyes: no retinal hemorrhage or exudates,  Neck supple, trachea midline, no carotid bruits, no thyroidmegaly Lungs: Clear to auscultation, no rhonchi or wheezes, or rib retractions  Heart: Regular rate and rhythm, no murmurs or gallops Breast:Examined in sitting and supine position were symmetrical in appearance, no palpable masses or tenderness,  no skin retraction, no nipple inversion, no nipple discharge, no skin discoloration, no axillary or supraclavicular lymphadenopathy Abdomen: no palpable masses or tenderness, no rebound or guarding Extremities: no edema or skin discoloration or tenderness  Pelvic: Vulva: Normal             Vagina: No gross lesions or discharge  Cervix: No gross lesions or discharge  Uterus  AV, normal size, shape and consistency, non-tender and mobile  Adnexa  Without masses or tenderness  Anus: Normal   Assessment/Plan:  53 y.o. female for annual exam   1. Well female  exam with routine gynecological exam Normal gynecologic exam in menopause.  Pap reflex negative 08/2018, no indication to repeat this year.  Breast exam normal.  Schedule Screening Mammo now.  Colono 12/2018.  Health labs with Fam MD, needs f/u on Liver Enzymes.  DM type 2 on Metformin and Glipizide.  cHTN on Hyzaar.  Hypothyroidism on Synthroid.  2. Postmenopause Well on no HRT.  No PMB.  Atrophic vaginitis of menopause with dryness/pain with IC.  Will try Coconut oil.  If not sufficient, will consider Estradiol cream.  3. Class 2 severe obesity due  to excess calories with serious comorbidity and body mass index (BMI) of 36.0 to 36.9 in adult [facility]) Continue with low Calorie/Low Carb/DM diet.  Aerobic activities 5 times a week and weightlifting every 2 days.  Princess Bruins MD, 10:06 AM 09/08/2019

## 2019-09-08 NOTE — Patient Instructions (Addendum)
1. Well female exam with routine gynecological exam Normal gynecologic exam in menopause.  Pap reflex negative 08/2018, no indication to repeat this year.  Breast exam normal.  Schedule Screening Mammo now.  [place] 12/2018.  Health labs with Fam MD, needs f/u on Liver Enzymes.  DM type 2 on Metformin and Glipizide.  cHTN on Hyzaar.  Hypothyroidism on Synthroid.  2. Postmenopause Well on no HRT.  No PMB.  Atrophic vaginitis of menopause with dryness/pain with IC.  Will try Coconut oil.  If not sufficient, will consider Estradiol cream.  3. Class 2 severe obesity due to excess calories with serious comorbidity and body mass index (BMI) of 36.0 to 36.9 in adult [facility]) Continue with low Calorie/Low Carb/DM diet.  Aerobic activities 5 times a week and weightlifting every 2 days.  Asencion Partridge, fue un placer verle hoy!

## 2019-09-21 ENCOUNTER — Other Ambulatory Visit (HOSPITAL_BASED_OUTPATIENT_CLINIC_OR_DEPARTMENT_OTHER): Payer: Self-pay | Admitting: Obstetrics & Gynecology

## 2019-09-21 DIAGNOSIS — Z1231 Encounter for screening mammogram for malignant neoplasm of breast: Secondary | ICD-10-CM

## 2019-09-24 ENCOUNTER — Other Ambulatory Visit: Payer: Self-pay

## 2019-09-24 ENCOUNTER — Ambulatory Visit (HOSPITAL_BASED_OUTPATIENT_CLINIC_OR_DEPARTMENT_OTHER)
Admission: RE | Admit: 2019-09-24 | Discharge: 2019-09-24 | Disposition: A | Discharge status: Home / Self Care | Source: Ambulatory Visit | Attending: Obstetrics & Gynecology | Admitting: Obstetrics & Gynecology

## 2019-09-24 DIAGNOSIS — Z1231 Encounter for screening mammogram for malignant neoplasm of breast: Secondary | ICD-10-CM | POA: Diagnosis present

## 2019-09-24 MED FILL — glipiZIDE 10 MG TABS: 10 | 30 days supply | Qty: 30 | Fill #3

## 2019-09-24 MED FILL — LEVOTHYROXINE 25 MCG TABLET: 25 | 30 days supply | Qty: 30 | Fill #3

## 2019-09-29 MED FILL — NEO/POLY/DEXAMET EYE OINT: 3.5-10000-0 | 7 days supply | Qty: 4 | Fill #0

## 2019-10-28 ENCOUNTER — Other Ambulatory Visit: Payer: Self-pay

## 2019-10-29 ENCOUNTER — Ambulatory Visit (INDEPENDENT_AMBULATORY_CARE_PROVIDER_SITE_OTHER): Admitting: Medical

## 2019-10-29 ENCOUNTER — Telehealth: Payer: Self-pay | Admitting: Medical

## 2019-10-29 ENCOUNTER — Other Ambulatory Visit: Payer: Self-pay

## 2019-10-29 ENCOUNTER — Other Ambulatory Visit: Payer: Self-pay | Admitting: Medical

## 2019-10-29 ENCOUNTER — Encounter: Payer: Self-pay | Admitting: Medical

## 2019-10-29 VITALS — BP 130/70 | HR 90 | Temp 96.9°F | Resp 18 | Ht 64.0 in | Wt 208.4 lb

## 2019-10-29 DIAGNOSIS — R059 Cough, unspecified: Secondary | ICD-10-CM

## 2019-10-29 DIAGNOSIS — R05 Cough: Secondary | ICD-10-CM

## 2019-10-29 DIAGNOSIS — K649 Unspecified hemorrhoids: Secondary | ICD-10-CM

## 2019-10-29 DIAGNOSIS — I10 Essential (primary) hypertension: Secondary | ICD-10-CM | POA: Diagnosis not present

## 2019-10-29 DIAGNOSIS — E119 Type 2 diabetes mellitus without complications: Secondary | ICD-10-CM | POA: Diagnosis not present

## 2019-10-29 DIAGNOSIS — E039 Hypothyroidism, unspecified: Secondary | ICD-10-CM | POA: Diagnosis not present

## 2019-10-29 DIAGNOSIS — G4730 Sleep apnea, unspecified: Secondary | ICD-10-CM

## 2019-10-29 LAB — COMPREHENSIVE METABOLIC PANEL
ALT: 90 U/L — ABNORMAL HIGH (ref 0–35)
AST: 54 U/L — ABNORMAL HIGH (ref 0–37)
Albumin: 4.4 g/dL (ref 3.5–5.2)
Alkaline Phosphatase: 87 U/L (ref 39–117)
BUN: 13 mg/dL (ref 6–23)
CO2: 26 mEq/L (ref 19–32)
Calcium: 10 mg/dL (ref 8.4–10.5)
Chloride: 102 mEq/L (ref 96–112)
Creatinine, Ser: 0.81 mg/dL (ref 0.40–1.20)
GFR: 74.01 mL/min (ref >60.00)
Glucose, Bld: 131 mg/dL — ABNORMAL HIGH (ref 70–99)
Potassium: 3.8 mEq/L (ref 3.5–5.1)
Sodium: 138 mEq/L (ref 135–145)
Total Bilirubin: 0.7 mg/dL (ref 0.2–1.2)
Total Protein: 7.1 g/dL (ref 6.0–8.3)

## 2019-10-29 LAB — TSH: TSH: 3.12 u[IU]/mL (ref 0.35–4.50)

## 2019-10-29 LAB — LIPID PANEL
Cholesterol: 148 mg/dL (ref 0–200)
HDL: 36.5 mg/dL — ABNORMAL LOW (ref >39.00)
LDL Cholesterol: 84 mg/dL (ref 0–99)
NonHDL: 111.82
Total CHOL/HDL Ratio: 4
Triglycerides: 137 mg/dL (ref 0.0–149.0)
VLDL: 27.4 mg/dL (ref 0.0–40.0)

## 2019-10-29 LAB — HEMOGLOBIN A1C: Hgb A1c MFr Bld: 7.9 % — ABNORMAL HIGH (ref 4.6–6.5)

## 2019-10-29 MED ORDER — POLYETHYLENE GLYCOL 3350 17 GM/SCOOP PO POWD: 17.0000 g | Freq: Two times a day (BID) | ORAL | 1 refills | Status: AC | PRN

## 2019-10-29 MED ORDER — METFORMIN HCL 1000 MG PO TABS: 1000.0000 mg | ORAL_TABLET | Freq: Two times a day (BID) | ORAL | 3 refills | Status: DC

## 2019-10-29 MED ORDER — LEVOTHYROXINE SODIUM 25 MCG PO TABS: 25.0000 ug | ORAL_TABLET | Freq: Every day | ORAL | 11 refills | Status: DC

## 2019-10-29 MED ORDER — GLIPIZIDE 10 MG PO TABS: 10.0000 mg | ORAL_TABLET | Freq: Every day | ORAL | 3 refills | Status: DC

## 2019-10-29 MED ORDER — LOSARTAN POTASSIUM-HCTZ 50-12.5 MG PO TABS: 1.0000 | ORAL_TABLET | Freq: Every day | ORAL | 1 refills | Status: DC

## 2019-10-29 MED FILL — metFORMIN HCL 1000 MG TABS: 1000 | 90 days supply | Qty: 180 | Fill #0

## 2019-10-29 MED FILL — NEO/POLY/DEXAMET EYE OINT: 3.5-10000-0 | 7 days supply | Qty: 4 | Fill #1

## 2019-10-29 MED FILL — glipiZIDE 10 MG TABS: 10 | 30 days supply | Qty: 30 | Fill #0

## 2019-10-29 MED FILL — SM CLEARLAX POWDER: 17 | 30 days supply | Qty: 1020 | Fill #0

## 2019-10-29 NOTE — Progress Notes (Signed)
Subjective:    Patient ID: Deborah Henderson, female    DOB: 1966-07-17, 54 y.o.   MRN: [id number]  HPI  Pt in for follow up.  Pt has recent fasting bs in am that are 128,155, 133, 138, 158, 128 and 126. Pt states eating a lot better recently. But admits eating excess rice.   Pt has low thyroid.   Pt bp initially borderline. On recheck better.  Pt states chronic constipation intermittent. In past used miralax otc. She wants to know if I can write rx to see if will be cheaper with insurance.  Pt has seen Dr Foy Guadalajara for hermorrhoids in past but last appointment was canceled due to covid in spring.   Pt has sleep apnea history and she needs referral. Has specialist but can't remember the name. She will update Kennyth Lose and then pass info to me so I can refer.    Review of Systems  Constitutional: Negative for chills, fatigue and fever.  HENT: Negative for congestion, drooling, mouth sores, postnasal drip and rhinorrhea.   Respiratory: Negative for chest tightness, shortness of breath, wheezing and stridor.   Cardiovascular: Negative for chest pain and palpitations.  Gastrointestinal: Negative for abdominal distention and anal bleeding.  Genitourinary: Negative for dysuria, frequency and hematuria.  Musculoskeletal: Negative for back pain.  Neurological: Negative for dizziness, speech difficulty, weakness, numbness and headaches.  Hematological: Negative for adenopathy. Does not bruise/bleed easily.  Psychiatric/Behavioral: Negative for behavioral problems and dysphoric mood. The patient is not nervous/anxious.     Past Medical History:  Diagnosis Date  . Diabetes mellitus ([place])    type 2  . Diverticulosis   . Hypertension   . Hypothyroidism   . Obesity   . Sleep apnea    Currently on CPAP machine     Social History   Socioeconomic History  . Marital status: Married    Spouse name: Not on file  . Number of children: 2  . Years of education: Not on file  . Highest  education level: Not on file  Occupational History  . Occupation: Housewife   Tobacco Use  . Smoking status: Former Smoker    Quit date: 11/09/1989    Years since quitting: 29.9  . Smokeless tobacco: Never Used  . Tobacco comment: Quit >20 years ago  Substance and Sexual Activity  . Alcohol use: Not Currently  . Drug use: No  . Sexual activity: Yes    Partners: Male  Other Topics Concern  . Not on file  Social History Narrative  . Not on file   Social Determinants of Health   Financial Resource Strain:   . Difficulty of Paying Living Expenses: Not on file  Food Insecurity:   . Worried About Charity fundraiser in the Last Year: Not on file  . Ran Out of Food in the Last Year: Not on file  Transportation Needs:   . Lack of Transportation (Medical): Not on file  . Lack of Transportation (Non-Medical): Not on file  Physical Activity:   . Days of Exercise per Week: Not on file  . Minutes of Exercise per Session: Not on file  Stress:   . Feeling of Stress : Not on file  Social Connections:   . Frequency of Communication with Friends and Family: Not on file  . Frequency of Social Gatherings with Friends and Family: Not on file  . Attends Religious Services: Not on file  . Active Member of Clubs or Organizations: Not on file  .  Attends Archivist Meetings: Not on file  . Marital Status: Not on file  Intimate Partner Violence:   . Fear of Current or Ex-Partner: Not on file  . Emotionally Abused: Not on file  . Physically Abused: Not on file  . Sexually Abused: Not on file    Past Surgical History:  Procedure Laterality Date  . CESAREAN SECTION     x2  . CHOLECYSTECTOMY    . CHOLECYSTECTOMY, LAPAROSCOPIC  06/02/2015   [place]  . COLONOSCOPY  02/04/2015   due 2021 per patient   . ECTOPIC PREGNANCY SURGERY    . ESOPHAGOGASTRODUODENOSCOPY  2015  . OVARIAN CYST SURGERY  1988   Left  . ROBOTIC ASSISTED BILATERAL SALPINGO OOPHERECTOMY N/A 05/21/2017    Procedure: XI ROBOTIC ASSIST  LEFT SALPINGO OOPHORECTOMY, LYSIS OF ADHESIONS;  Surgeon: Nancy Marus, MD;  Location: [facility];  Service: Gynecology;  Laterality: N/A;    Family History  Problem Relation Age of Onset  . Cancer Father        colon  . Diabetes Father   . Prostate cancer Father   . Cancer Brother        bone   . Diabetes Brother   . Diabetes Brother   . Esophageal cancer Neg Hx     Allergies  Allergen Reactions  . Asa [Aspirin] Anaphylaxis and Rash    Current Outpatient Medications on File Prior to Visit  Medication Sig Dispense Refill  . levothyroxine (SYNTHROID) 25 MCG tablet TAKE 1 TABLET (25 MCG TOTAL) BY MOUTH DAILY BEFORE BREAKFAST. 30 tablet 3  . losartan-hydrochlorothiazide (HYZAAR) 50-12.5 MG tablet Take 1 tablet by mouth daily. 90 tablet 1   Current Facility-Administered Medications on File Prior to Visit  Medication Dose Route Frequency Provider Last Rate Last Admin  . 0.9 %  sodium chloride infusion  500 mL Intravenous Once Cirigliano, Vito V, DO        BP 140/88 (BP Location: Right Arm, Patient Position: Sitting, Cuff Size: Large)   Pulse 90   Temp (!) 96.9 F (36.1 C) (Temporal)   Resp 18   Ht 5\' 4"  (1.626 m)   Wt 208 lb 6.4 oz (94.5 kg)   SpO2 98%   BMI 35.77 kg/m       Objective:   Physical Exam  General Mental Status- Alert. General Appearance- Not in acute distress.   Skin General: Color- Normal Color. Moisture- Normal Moisture.  Neck Carotid Arteries- Normal color. Moisture- Normal Moisture. No carotid bruits. No JVD.  Chest and Lung Exam Auscultation: Breath Sounds:-Normal.  Cardiovascular Auscultation:Rythm- Regular. Murmurs & Other Heart Sounds:Auscultation of the heart reveals- No Murmurs.  Abdomen Inspection:-Inspeection Normal. Palpation/Percussion:Note:No mass. Palpation and Percussion of the abdomen reveal- Non Tender, Non Distended + BS, no rebound or guarding.    Neurologic Cranial Nerve exam:- CN III-XII  intact(No nystagmus), symmetric smile. Strength:- 5/5 equal and symmetric strength both upper and lower extremities.      Assessment & Plan:  For diabetes, will check a1c today. Refilled your meds today.   Pt has low thyroid. Check level today then refill med.  Pt bp reasonable controlled today on recheck. Refilled bp med today.  For chronic intermittent constipation rx miralax. Pt up to date on colonoscopy.  For sleep apnea, update me on who you see and can place referral. But you also can try to call directly since you are already established.  Follow up in 3 months or as needed  30 minutes spent with  pt today. 50% if time spent counseling on plan going forward.

## 2019-10-29 NOTE — Patient Instructions (Addendum)
For diabetes, will check a1c today. Refilled your meds today.   Pt has low thyroid. Check level today then refill med.  Pt bp reasonable controlled today on recheck. Refilled bp med today.  For chronic intermittent constipation rx miralax. Pt up to date on colonoscopy.  For sleep apnea, update me on who you see and can place referral. But you also can try to call directly since you are already established.  Follow up in 3 months or as needed

## 2019-10-29 NOTE — Telephone Encounter (Signed)
Rx levothyroxine sent to pt pharmacy. 

## 2019-10-30 MED FILL — LEVOTHYROXINE 25 MCG TABLET: 25 | 30 days supply | Qty: 30 | Fill #0

## 2019-11-02 MED FILL — LOSARTAN-HCTZ 50-12.5 MG TA: 50-12.5 | 90 days supply | Qty: 90 | Fill #0

## 2019-12-01 MED FILL — glipiZIDE 10 MG TABS: 10 | 30 days supply | Qty: 30 | Fill #1

## 2019-12-01 MED FILL — LEVOTHYROXINE 25 MCG TABLET: 25 | 30 days supply | Qty: 30 | Fill #1

## 2019-12-28 MED FILL — LEVOTHYROXINE 25 MCG TABLET: 25 | 30 days supply | Qty: 30 | Fill #2

## 2019-12-28 MED FILL — glipiZIDE 10 MG TABS: 10 | 30 days supply | Qty: 30 | Fill #2

## 2020-02-02 MED FILL — METFORMIN HCL 1000 MG TABS: 1000 | 90 days supply | Qty: 180 | Fill #1

## 2020-02-02 MED FILL — glipiZIDE 10 MG TABS: 10 | 30 days supply | Qty: 30 | Fill #3

## 2020-02-02 MED FILL — LEVOTHYROXINE 25 MCG TABLET: 25 | 30 days supply | Qty: 30 | Fill #3

## 2020-03-01 ENCOUNTER — Encounter: Payer: Self-pay | Admitting: Medical

## 2020-03-01 ENCOUNTER — Other Ambulatory Visit: Payer: Self-pay | Admitting: Medical

## 2020-03-01 MED ORDER — GLIPIZIDE 10 MG PO TABS: 10.0000 mg | ORAL_TABLET | Freq: Every day | ORAL | 3 refills | Status: DC

## 2020-03-17 MED FILL — LEVOTHYROXINE SODIUM 25 MCG: 25 | 30 days supply | Qty: 30 | Fill #4

## 2020-03-17 MED FILL — LOSARTAN-HCTZ 50-12.5 MG TA: 50-12.5 | 90 days supply | Qty: 90 | Fill #1

## 2020-03-22 ENCOUNTER — Other Ambulatory Visit: Payer: Self-pay

## 2020-03-22 MED ORDER — LEVOTHYROXINE SODIUM 25 MCG PO TABS: 25.0000 ug | ORAL_TABLET | Freq: Every day | ORAL | 11 refills | Status: DC

## 2020-04-18 MED FILL — LEVOTHYROXINE SODIUM 25 MCG: 25 | 30 days supply | Qty: 30 | Fill #5

## 2020-05-24 MED FILL — LEVOTHYROXINE SODIUM 25 MCG: 25 | 30 days supply | Qty: 30 | Fill #6

## 2020-05-24 MED FILL — METFORMIN HCL 1000 MG TABS: 1000 | 90 days supply | Qty: 180 | Fill #2

## 2020-05-24 MED FILL — glipiZIDE 10 MG TABS: 10 | 30 days supply | Qty: 30 | Fill #1

## 2020-06-23 ENCOUNTER — Other Ambulatory Visit: Payer: Self-pay

## 2020-06-23 ENCOUNTER — Other Ambulatory Visit: Payer: Self-pay | Admitting: Medical

## 2020-06-23 ENCOUNTER — Ambulatory Visit (INDEPENDENT_AMBULATORY_CARE_PROVIDER_SITE_OTHER): Admitting: Medical

## 2020-06-23 ENCOUNTER — Encounter: Payer: Self-pay | Admitting: Medical

## 2020-06-23 VITALS — BP 131/85 | HR 96 | Temp 98.8°F | Resp 18 | Ht 64.0 in | Wt 208.8 lb

## 2020-06-23 DIAGNOSIS — Z0000 Encounter for general adult medical examination without abnormal findings: Secondary | ICD-10-CM

## 2020-06-23 DIAGNOSIS — R109 Unspecified abdominal pain: Secondary | ICD-10-CM | POA: Diagnosis not present

## 2020-06-23 DIAGNOSIS — E039 Hypothyroidism, unspecified: Secondary | ICD-10-CM

## 2020-06-23 DIAGNOSIS — Z113 Encounter for screening for infections with a predominantly sexual mode of transmission: Secondary | ICD-10-CM | POA: Diagnosis not present

## 2020-06-23 DIAGNOSIS — E119 Type 2 diabetes mellitus without complications: Secondary | ICD-10-CM | POA: Diagnosis not present

## 2020-06-23 MED ORDER — LOSARTAN POTASSIUM-HCTZ 50-12.5 MG PO TABS: 1.0000 | ORAL_TABLET | Freq: Every day | ORAL | 3 refills | Status: DC

## 2020-06-23 MED FILL — LOSARTAN-HCTZ 50-12.5 MG TA: 50-12.5 | 90 days supply | Qty: 90 | Fill #0

## 2020-06-23 MED FILL — LEVOTHYROXINE SODIUM 25 MCG: 25 | 30 days supply | Qty: 30 | Fill #7

## 2020-06-23 NOTE — Patient Instructions (Addendum)
For you wellness exam today I have ordered cbc, cmp, tsh, t4,  lipid panel, ua and hiv.  Vaccine up to date flu vaccine in October.  Recommend exercise and healthy diet.  We will let you know lab results as they come in.  Follow up date appointment will be determined after lab review.   For atypical vague low level abdomen pain/left upper quadrant will include lipase and hyplori breath test.  Follow up 3 months or as needed. Maybe sooner if labs abnormal.   May refer to endocrinologist as requested but will get a1c first. Maybe med change will resolve loose stools which appears to occur possible increase dose metormin.   Preventive Care 42-53 Years Old, Female Preventive care refers to visits with your health care provider and lifestyle choices that can promote health and wellness. This includes:  A yearly physical exam. This may also be called an annual well check.  Regular dental visits and eye exams.  Immunizations.  Screening for certain conditions.  Healthy lifestyle choices, such as eating a healthy diet, getting regular exercise, not using drugs or products that contain nicotine and tobacco, and limiting alcohol use. What can I expect for my preventive care visit? Physical exam Your health care provider will check your:  Height and weight. This may be used to calculate body mass index (BMI), which tells if you are at a healthy weight.  Heart rate and blood pressure.  Skin for abnormal spots. Counseling Your health care provider may ask you questions about your:  Alcohol, tobacco, and drug use.  Emotional well-being.  Home and relationship well-being.  Sexual activity.  Eating habits.  Work and work Statistician.  Method of birth control.  Menstrual cycle.  Pregnancy history. What immunizations do I need?  Influenza (flu) vaccine  This is recommended every year. Tetanus, diphtheria, and pertussis (Tdap) vaccine  You may need a Td booster every 10  years. Varicella (chickenpox) vaccine  You may need this if you have not been vaccinated. Zoster (shingles) vaccine  You may need this after age 36. Measles, mumps, and rubella (MMR) vaccine  You may need at least one dose of MMR if you were born in 1957 or later. You may also need a second dose. Pneumococcal conjugate (PCV13) vaccine  You may need this if you have certain conditions and were not previously vaccinated. Pneumococcal polysaccharide (PPSV23) vaccine  You may need one or two doses if you smoke cigarettes or if you have certain conditions. Meningococcal conjugate (MenACWY) vaccine  You may need this if you have certain conditions. Hepatitis A vaccine  You may need this if you have certain conditions or if you travel or work in places where you may be exposed to hepatitis A. Hepatitis B vaccine  You may need this if you have certain conditions or if you travel or work in places where you may be exposed to hepatitis B. Haemophilus influenzae type b (Hib) vaccine  You may need this if you have certain conditions. Human papillomavirus (HPV) vaccine  If recommended by your health care provider, you may need three doses over 6 months. You may receive vaccines as individual doses or as more than one vaccine together in one shot (combination vaccines). Talk with your health care provider about the risks and benefits of combination vaccines. What tests do I need? Blood tests  Lipid and cholesterol levels. These may be checked every 5 years, or more frequently if you are over 35 years old.  Hepatitis C  test.  Hepatitis B test. Screening  Lung cancer screening. You may have this screening every year starting at age 77 if you have a 30-pack-year history of smoking and currently smoke or have quit within the past 15 years.  Colorectal cancer screening. All adults should have this screening starting at age 44 and continuing until age 33. Your health care provider may  recommend screening at age 86 if you are at increased risk. You will have tests every 1-10 years, depending on your results and the type of screening test.  Diabetes screening. This is done by checking your blood sugar (glucose) after you have not eaten for a while (fasting). You may have this done every 1-3 years.  Mammogram. This may be done every 1-2 years. Talk with your health care provider about when you should start having regular mammograms. This may depend on whether you have a family history of breast cancer.  BRCA-related cancer screening. This may be done if you have a family history of breast, ovarian, tubal, or peritoneal cancers.  Pelvic exam and Pap test. This may be done every 3 years starting at age 48. Starting at age 46, this may be done every 5 years if you have a Pap test in combination with an HPV test. Other tests  Sexually transmitted disease (STD) testing.  Bone density scan. This is done to screen for osteoporosis. You may have this scan if you are at high risk for osteoporosis. Follow these instructions at home: Eating and drinking  Eat a diet that includes fresh fruits and vegetables, whole grains, lean protein, and low-fat dairy.  Take vitamin and mineral supplements as recommended by your health care provider.  Do not drink alcohol if: ? Your health care provider tells you not to drink. ? You are pregnant, may be pregnant, or are planning to become pregnant.  If you drink alcohol: ? Limit how much you have to 0-1 drink a day. ? Be aware of how much alcohol is in your drink. In the [place] one drink equals one 12 oz bottle of beer (355 mL), one 5 oz glass of wine (148 mL), or one 1 oz glass of hard liquor (44 mL). Lifestyle  Take daily care of your teeth and gums.  Stay active. Exercise for at least 30 minutes on 5 or more days each week.  Do not use any products that contain nicotine or tobacco, such as cigarettes, e-cigarettes, and chewing tobacco. If  you need help quitting, ask your health care provider.  If you are sexually active, practice safe sex. Use a condom or other form of birth control (contraception) in order to prevent pregnancy and STIs (sexually transmitted infections).  If told by your health care provider, take low-dose aspirin daily starting at age 35. What's next?  Visit your health care provider once a year for a well check visit.  Ask your health care provider how often you should have your eyes and teeth checked.  Stay up to date on all vaccines. This information is not intended to replace advice given to you by your health care provider. Make sure you discuss any questions you have with your health care provider. Document Revised: 06/19/2018 Document Reviewed: 06/19/2018 Elsevier Patient Education  2020 [employer].

## 2020-06-23 NOTE — Progress Notes (Signed)
Subjective:    Patient ID: Deborah Henderson, female    DOB: 04-Jan-1966, 54 y.o.   MRN: [id number]  HPI  Pt in for cpe/wellness.  She is fasting.  Pt has had covid vaccine in past.  Pt plans to get booster when offered. Also states will get flu vaccine in October.  Needs refill of all her meds.  Pt states recently loose stools. Pt has been on metformin since 2016. She states before for years. Former was on 500 mg bid. In past her a1c was in 7 range on last check.  Bp is well controlled.  Pt has pap scheduled for November with gyn.   Review of Systems  Constitutional: Negative for chills, fatigue and fever.  Respiratory: Negative for cough, chest tightness, shortness of breath and wheezing.   Cardiovascular: Negative for chest pain and palpitations.  Gastrointestinal: Negative for abdominal pain, nausea and vomiting.       Vague sensation of numbness on and off in abdomen. No nausea, no vomting, no fever. No diarrhea or constipation.  No rash on stomach.  Genitourinary: Negative for dysuria, frequency, hematuria and urgency.  Musculoskeletal: Negative for back pain, joint swelling and myalgias.  Neurological: Negative for dizziness, seizures, syncope, weakness and headaches.  Hematological: Negative for adenopathy. Does not bruise/bleed easily.  Psychiatric/Behavioral: Negative for confusion.    Past Medical History:  Diagnosis Date  . Diabetes mellitus ([place])    type 2  . Diverticulosis   . Hypertension   . Hypothyroidism   . Obesity   . Sleep apnea    Currently on CPAP machine     Social History   Socioeconomic History  . Marital status: Married    Spouse name: Not on file  . Number of children: 2  . Years of education: Not on file  . Highest education level: Not on file  Occupational History  . Occupation: Housewife   Tobacco Use  . Smoking status: Former Smoker    Quit date: 11/09/1989    Years since quitting: 30.6  . Smokeless tobacco: Never Used    . Tobacco comment: Quit >20 years ago  Vaping Use  . Vaping Use: Never used  Substance and Sexual Activity  . Alcohol use: Not Currently  . Drug use: No  . Sexual activity: Yes    Partners: Male  Other Topics Concern  . Not on file  Social History Narrative  . Not on file   Social Determinants of Health   Financial Resource Strain:   . Difficulty of Paying Living Expenses: Not on file  Food Insecurity:   . Worried About Charity fundraiser in the Last Year: Not on file  . Ran Out of Food in the Last Year: Not on file  Transportation Needs:   . Lack of Transportation (Medical): Not on file  . Lack of Transportation (Non-Medical): Not on file  Physical Activity:   . Days of Exercise per Week: Not on file  . Minutes of Exercise per Session: Not on file  Stress:   . Feeling of Stress : Not on file  Social Connections:   . Frequency of Communication with Friends and Family: Not on file  . Frequency of Social Gatherings with Friends and Family: Not on file  . Attends Religious Services: Not on file  . Active Member of Clubs or Organizations: Not on file  . Attends Archivist Meetings: Not on file  . Marital Status: Not on file  Intimate Partner Violence:   .  Fear of Current or Ex-Partner: Not on file  . Emotionally Abused: Not on file  . Physically Abused: Not on file  . Sexually Abused: Not on file    Past Surgical History:  Procedure Laterality Date  . CESAREAN SECTION     x2  . CHOLECYSTECTOMY    . CHOLECYSTECTOMY, LAPAROSCOPIC  06/02/2015   [place]  . COLONOSCOPY  02/04/2015   due 2021 per patient   . ECTOPIC PREGNANCY SURGERY    . ESOPHAGOGASTRODUODENOSCOPY  2015  . OVARIAN CYST SURGERY  1988   Left  . ROBOTIC ASSISTED BILATERAL SALPINGO OOPHERECTOMY N/A 05/21/2017   Procedure: XI ROBOTIC ASSIST  LEFT SALPINGO OOPHORECTOMY, LYSIS OF ADHESIONS;  Surgeon: Nancy Marus, MD;  Location: [facility];  Service: Gynecology;  Laterality: N/A;    Family  History  Problem Relation Age of Onset  . Cancer Father        colon  . Diabetes Father   . Prostate cancer Father   . Cancer Brother        bone   . Diabetes Brother   . Diabetes Brother   . Esophageal cancer Neg Hx     Allergies  Allergen Reactions  . Asa [Aspirin] Anaphylaxis and Rash    Current Outpatient Medications on File Prior to Visit  Medication Sig Dispense Refill  . glipiZIDE (GLUCOTROL) 10 MG tablet Take 1 tablet (10 mg total) by mouth daily before breakfast. 30 tablet 3  . levothyroxine (SYNTHROID) 25 MCG tablet Take 1 tablet (25 mcg total) by mouth daily before breakfast. 30 tablet 11  . metFORMIN (GLUCOPHAGE) 1000 MG tablet Take 1 tablet (1,000 mg total) by mouth 2 (two) times daily with a meal. 180 tablet 3  . polyethylene glycol powder (GLYCOLAX/MIRALAX) 17 GM/SCOOP powder Take 17 g by mouth 2 (two) times daily as needed. 3350 g 1   Current Facility-Administered Medications on File Prior to Visit  Medication Dose Route Frequency Provider Last Rate Last Admin  . 0.9 %  sodium chloride infusion  500 mL Intravenous Once Cirigliano, Vito V, DO        BP 131/85   Pulse 96   Temp 98.8 F (37.1 C) (Oral)   Resp 18   Wt 208 lb 12.8 oz (94.7 kg)   SpO2 99%   BMI 35.84 kg/m       Objective:   Physical Exam  General Mental Status- Alert. General Appearance- Not in acute distress.   Skin General: Color- Normal Color. Moisture- Normal Moisture. Small moles on back. No worrisome moles or lesions. Tiny scattered skin tags upper back.  Neck Carotid Arteries- Normal color. Moisture- Normal Moisture. No carotid bruits. No JVD.  Chest and Lung Exam Auscultation: Breath Sounds:-Normal.  Cardiovascular Auscultation:Rythm- Regular. Murmurs & Other Heart Sounds:Auscultation of the heart reveals- No Murmurs.  Abdomen Inspection:-Inspeection Normal. Palpation/Percussion:Note:No mass. Palpation and Percussion of the abdomen reveal- Non Tender, Non Distended +  BS, no rebound or guarding.   Neurologic Cranial Nerve exam:- CN III-XII intact(No nystagmus), symmetric smile. Strength:- 5/5 equal and symmetric strength both upper and lower extremities.     Assessment & Plan:  Vaccine up to date flu vaccine in October.  Recommend exercise and healthy diet.  We will let you know lab results as they come in.  Follow up date appointment will be determined after lab review.   For atypical vague low level abdomen pain/left upper quadrant will include lipase and hyplori breath test.  Follow up 3 months or as  needed. Maybe sooner if labs abnormal.   May refer to endocrinologist as requested but will get a1c first. Maybe med change will resolve loose stools which appears to occur possible increase dose metormin.  Mackie Pai, PA-C   [phone] charge as discussed side effect metformin, addressed abdomen pain and started work up.

## 2020-06-24 LAB — H. PYLORI BREATH TEST: H. pylori Breath Test: DETECTED — AB

## 2020-06-25 ENCOUNTER — Other Ambulatory Visit: Payer: Self-pay | Admitting: Medical

## 2020-06-25 ENCOUNTER — Telehealth: Payer: Self-pay | Admitting: Medical

## 2020-06-25 DIAGNOSIS — E119 Type 2 diabetes mellitus without complications: Secondary | ICD-10-CM

## 2020-06-25 MED ORDER — OMEPRAZOLE 40 MG PO CPDR: 40.0000 mg | DELAYED_RELEASE_CAPSULE | Freq: Every day | ORAL | 1 refills | Status: DC

## 2020-06-25 MED ORDER — AMOXICILLIN 875 MG PO TABS: 875.0000 mg | ORAL_TABLET | Freq: Two times a day (BID) | ORAL | 0 refills | Status: DC

## 2020-06-25 MED ORDER — CLARITHROMYCIN ER 500 MG PO TB24: 1000.0000 mg | ORAL_TABLET | Freq: Every day | ORAL | 0 refills | Status: DC

## 2020-06-25 NOTE — Telephone Encounter (Signed)
Sent in meds for h pylori.

## 2020-06-25 NOTE — Telephone Encounter (Signed)
Referral to endocrinologist placed. 

## 2020-06-28 LAB — COMPREHENSIVE METABOLIC PANEL
AG Ratio: 1.4 (calc) (ref 1.0–2.5)
ALT: 162 U/L — ABNORMAL HIGH (ref 6–29)
AST: 143 U/L — ABNORMAL HIGH (ref 10–35)
Albumin: 4.3 g/dL (ref 3.6–5.1)
Alkaline phosphatase (APISO): 114 U/L (ref 37–153)
BUN: 12 mg/dL (ref 7–25)
CO2: 25 mmol/L (ref 20–32)
Calcium: 10.1 mg/dL (ref 8.6–10.4)
Chloride: 101 mmol/L (ref 98–110)
Creat: 0.79 mg/dL (ref 0.50–1.05)
Globulin: 3.1 g/dL (calc) (ref 1.9–3.7)
Glucose, Bld: 194 mg/dL — ABNORMAL HIGH (ref 65–99)
Potassium: 4.5 mmol/L (ref 3.5–5.3)
Sodium: 136 mmol/L (ref 135–146)
Total Bilirubin: 0.6 mg/dL (ref 0.2–1.2)
Total Protein: 7.4 g/dL (ref 6.1–8.1)

## 2020-06-28 LAB — LIPID PANEL
Cholesterol: 144 mg/dL (ref <200)
HDL: 36 mg/dL — ABNORMAL LOW (ref >=50)
LDL Cholesterol (Calc): 85 mg/dL (calc)
Non-HDL Cholesterol (Calc): 108 mg/dL (calc) (ref <130)
Total CHOL/HDL Ratio: 4 (calc) (ref <5.0)
Triglycerides: 132 mg/dL (ref <150)

## 2020-06-28 LAB — CBC WITH DIFFERENTIAL/PLATELET
Absolute Monocytes: 500 cells/uL (ref 200–950)
Basophils Absolute: 49 cells/uL (ref 0–200)
Basophils Relative: 0.6 %
Eosinophils Absolute: 57 cells/uL (ref 15–500)
Eosinophils Relative: 0.7 %
HCT: 42.2 % (ref 35.0–45.0)
Hemoglobin: 14.2 g/dL (ref 11.7–15.5)
Lymphs Abs: 2952 cells/uL (ref 850–3900)
MCH: 30.3 pg (ref 27.0–33.0)
MCHC: 33.6 g/dL (ref 32.0–36.0)
MCV: 90.2 fL (ref 80.0–100.0)
MPV: 11.4 fL (ref 7.5–12.5)
Monocytes Relative: 6.1 %
Neutro Abs: 4641 cells/uL (ref 1500–7800)
Neutrophils Relative %: 56.6 %
Platelets: 233 10*3/uL (ref 140–400)
RBC: 4.68 10*6/uL (ref 3.80–5.10)
RDW: 13.2 % (ref 11.0–15.0)
Total Lymphocyte: 36 %
WBC: 8.2 10*3/uL (ref 3.8–10.8)

## 2020-06-28 LAB — HEMOGLOBIN A1C
Hgb A1c MFr Bld: 10.2 % of total Hgb — ABNORMAL HIGH (ref <5.7)
Mean Plasma Glucose: 246 (calc)
eAG (mmol/L): 13.6 (calc)

## 2020-06-28 LAB — LIPASE: Lipase: 64 U/L — ABNORMAL HIGH (ref 7–60)

## 2020-06-28 LAB — TSH: TSH: 1.86 mIU/L

## 2020-06-28 LAB — T4, FREE: Free T4: 1 ng/dL (ref 0.8–1.8)

## 2020-06-28 LAB — HIV ANTIBODY (ROUTINE TESTING W REFLEX): HIV 1&2 Ab, 4th Generation: NONREACTIVE

## 2020-06-28 MED FILL — OMEPRAZOLE 40 MG CPDR: 40 | 30 days supply | Qty: 30 | Fill #0

## 2020-06-28 MED FILL — AMOXICILLIN 875 MG TABS: 875 | 10 days supply | Qty: 20 | Fill #0

## 2020-06-28 MED FILL — CLARITHROMYCIN ER 500 MG TB: 500 | 10 days supply | Qty: 20 | Fill #0

## 2020-07-07 ENCOUNTER — Ambulatory Visit (INDEPENDENT_AMBULATORY_CARE_PROVIDER_SITE_OTHER): Admitting: Medical

## 2020-07-07 ENCOUNTER — Other Ambulatory Visit: Payer: Self-pay

## 2020-07-07 VITALS — BP 122/74 | HR 85 | Temp 98.8°F | Resp 18 | Ht 64.0 in | Wt 209.8 lb

## 2020-07-07 DIAGNOSIS — E119 Type 2 diabetes mellitus without complications: Secondary | ICD-10-CM

## 2020-07-07 DIAGNOSIS — Z8619 Personal history of other infectious and parasitic diseases: Secondary | ICD-10-CM | POA: Diagnosis not present

## 2020-07-07 DIAGNOSIS — K760 Fatty (change of) liver, not elsewhere classified: Secondary | ICD-10-CM

## 2020-07-07 DIAGNOSIS — R748 Abnormal levels of other serum enzymes: Secondary | ICD-10-CM

## 2020-07-07 NOTE — Patient Instructions (Signed)
You have history of H. pylori infection recently.  Still taking antibiotics but your symptoms have resolved already.  I want you to take a full course of antibiotic and continue omeprazole.  Recent minimal pancreas enzyme elevation.  Will repeat today make sure not increasing.  Hopefully will be back into normal range.  History of fatty liver and a recent liver enzyme elevation.  We will repeat liver enzymes, add comprehensive hepatitis panel and want to get scheduled for a repeat abdomen ultrasound.  Keep appointment with endocrinologist as scheduled.  Follow-up date with me to be determined pending lab review and ultrasound review.

## 2020-07-07 NOTE — Progress Notes (Signed)
Subjective:    Patient ID: Deborah Henderson, female    DOB: 01/27/1966, 54 y.o.   MRN: [id number]  HPI  Pt in for follow up for abd pain.  She tested + for h pylori. Antibiotics and omeprazole resolved her pain. A lot better.  Pt has diabetes. Pt is taking metformin and glipizide. Stating eating a lot better now. Pt requested referral to Dr. Kelton Pillar so that has been placed.  Hx of fatty liver, elevated liver enzymes and recent mild lipase increase. No nausea or vomiting.   Review of Systems  Constitutional: Negative for chills, fatigue and fever.  Respiratory: Negative for cough, chest tightness, shortness of breath and wheezing.   Cardiovascular: Negative for chest pain and palpitations.  Gastrointestinal: Negative for abdominal pain.  Musculoskeletal: Negative for back pain.  Neurological: Negative for dizziness, speech difficulty, weakness and light-headedness.  Hematological: Negative for adenopathy. Does not bruise/bleed easily.  Psychiatric/Behavioral: Negative for behavioral problems and confusion.    Past Medical History:  Diagnosis Date  . Diabetes mellitus ([place])    type 2  . Diverticulosis   . Hypertension   . Hypothyroidism   . Obesity   . Sleep apnea    Currently on CPAP machine     Social History   Socioeconomic History  . Marital status: Married    Spouse name: Not on file  . Number of children: 2  . Years of education: Not on file  . Highest education level: Not on file  Occupational History  . Occupation: Housewife   Tobacco Use  . Smoking status: Former Smoker    Quit date: 11/09/1989    Years since quitting: 30.6  . Smokeless tobacco: Never Used  . Tobacco comment: Quit >20 years ago  Vaping Use  . Vaping Use: Never used  Substance and Sexual Activity  . Alcohol use: Not Currently  . Drug use: No  . Sexual activity: Yes    Partners: Male  Other Topics Concern  . Not on file  Social History Narrative  . Not on file   Social  Determinants of Health   Financial Resource Strain:   . Difficulty of Paying Living Expenses: Not on file  Food Insecurity:   . Worried About Charity fundraiser in the Last Year: Not on file  . Ran Out of Food in the Last Year: Not on file  Transportation Needs:   . Lack of Transportation (Medical): Not on file  . Lack of Transportation (Non-Medical): Not on file  Physical Activity:   . Days of Exercise per Week: Not on file  . Minutes of Exercise per Session: Not on file  Stress:   . Feeling of Stress : Not on file  Social Connections:   . Frequency of Communication with Friends and Family: Not on file  . Frequency of Social Gatherings with Friends and Family: Not on file  . Attends Religious Services: Not on file  . Active Member of Clubs or Organizations: Not on file  . Attends Archivist Meetings: Not on file  . Marital Status: Not on file  Intimate Partner Violence:   . Fear of Current or Ex-Partner: Not on file  . Emotionally Abused: Not on file  . Physically Abused: Not on file  . Sexually Abused: Not on file    Past Surgical History:  Procedure Laterality Date  . CESAREAN SECTION     x2  . CHOLECYSTECTOMY    . CHOLECYSTECTOMY, LAPAROSCOPIC  06/02/2015   [place]  . COLONOSCOPY  02/04/2015   due 2021 per patient   . ECTOPIC PREGNANCY SURGERY    . ESOPHAGOGASTRODUODENOSCOPY  2015  . OVARIAN CYST SURGERY  1988   Left  . ROBOTIC ASSISTED BILATERAL SALPINGO OOPHERECTOMY N/A 05/21/2017   Procedure: XI ROBOTIC ASSIST  LEFT SALPINGO OOPHORECTOMY, LYSIS OF ADHESIONS;  Surgeon: Nancy Marus, MD;  Location: [facility];  Service: Gynecology;  Laterality: N/A;    Family History  Problem Relation Age of Onset  . Cancer Father        colon  . Diabetes Father   . Prostate cancer Father   . Cancer Brother        bone   . Diabetes Brother   . Diabetes Brother   . Esophageal cancer Neg Hx     Allergies  Allergen Reactions  . Asa [Aspirin] Anaphylaxis and  Rash    Current Outpatient Medications on File Prior to Visit  Medication Sig Dispense Refill  . amoxicillin (AMOXIL) 875 MG tablet Take 1 tablet (875 mg total) by mouth 2 (two) times daily. (Patient not taking: Reported on 07/07/2020) 20 tablet 0  . clarithromycin (BIAXIN XL) 500 MG 24 hr tablet Take 2 tablets (1,000 mg total) by mouth daily. 20 tablet 0  . glipiZIDE (GLUCOTROL) 10 MG tablet Take 1 tablet (10 mg total) by mouth daily before breakfast. 30 tablet 3  . levothyroxine (SYNTHROID) 25 MCG tablet Take 1 tablet (25 mcg total) by mouth daily before breakfast. 30 tablet 11  . losartan-hydrochlorothiazide (HYZAAR) 50-12.5 MG tablet Take 1 tablet by mouth daily. 90 tablet 3  . metFORMIN (GLUCOPHAGE) 1000 MG tablet Take 1 tablet (1,000 mg total) by mouth 2 (two) times daily with a meal. 180 tablet 3  . omeprazole (PRILOSEC) 40 MG capsule Take 1 capsule (40 mg total) by mouth daily. 30 capsule 1  . polyethylene glycol powder (GLYCOLAX/MIRALAX) 17 GM/SCOOP powder Take 17 g by mouth 2 (two) times daily as needed. 3350 g 1   Current Facility-Administered Medications on File Prior to Visit  Medication Dose Route Frequency Provider Last Rate Last Admin  . 0.9 %  sodium chloride infusion  500 mL Intravenous Once Cirigliano, Vito V, DO        BP 122/74   Pulse 85   Temp 98.8 F (37.1 C) (Oral)   Resp 18   Ht 5\' 4"  (1.626 m)   Wt 209 lb 12.8 oz (95.2 kg)   SpO2 97%   BMI 36.01 kg/m      Objective:   Physical Exam  General Appearance- Not in acute distress.  HEENT Eyes- Scleraeral/Conjuntiva-bilat- Not Yellow. Mouth & Throat- Normal.  Chest and Lung Exam Auscultation: Breath sounds:-Normal. Adventitious sounds:- No Adventitious sounds.  Cardiovascular Auscultation:Rythm - Regular. Heart Sounds -Normal heart sounds.  Abdomen Inspection:-Inspection Normal.  Palpation/Perucssion: Palpation and Percussion of the abdomen reveal- Non Tender, No Rebound tenderness, No  rigidity(Guarding) and No Palpable abdominal masses.  Liver:-Normal.  Spleen:- Normal.   Back- no cva tenderness.     Assessment & Plan:  You have history of H. pylori infection recently.  Still taking antibiotics but your symptoms have resolved already.  I want you to take a full course of antibiotic and continue omeprazole.  Recent minimal pancreas enzyme elevation.  Will repeat today make sure not increasing.  Hopefully will be back into normal range.  History of fatty liver and a recent liver enzyme elevation.  We will repeat liver enzymes, add comprehensive hepatitis panel and  want to get scheduled for a repeat abdomen ultrasound.  Keep appointment with endocrinologist as scheduled.  Follow-up date with me to be determined pending lab review and ultrasound review.

## 2020-07-08 LAB — COMPREHENSIVE METABOLIC PANEL
AG Ratio: 1.7 (calc) (ref 1.0–2.5)
ALT: 145 U/L — ABNORMAL HIGH (ref 6–29)
AST: 98 U/L — ABNORMAL HIGH (ref 10–35)
Albumin: 4.5 g/dL (ref 3.6–5.1)
Alkaline phosphatase (APISO): 91 U/L (ref 37–153)
BUN: 13 mg/dL (ref 7–25)
CO2: 28 mmol/L (ref 20–32)
Calcium: 10.4 mg/dL (ref 8.6–10.4)
Chloride: 101 mmol/L (ref 98–110)
Creat: 0.78 mg/dL (ref 0.50–1.05)
Globulin: 2.6 g/dL (calc) (ref 1.9–3.7)
Glucose, Bld: 125 mg/dL — ABNORMAL HIGH (ref 65–99)
Potassium: 4.1 mmol/L (ref 3.5–5.3)
Sodium: 138 mmol/L (ref 135–146)
Total Bilirubin: 0.6 mg/dL (ref 0.2–1.2)
Total Protein: 7.1 g/dL (ref 6.1–8.1)

## 2020-07-08 LAB — HEPATITIS C ANTIBODY
Hepatitis C Ab: NONREACTIVE
SIGNAL TO CUT-OFF: 0.01 (ref <1.00)

## 2020-07-08 LAB — LIPASE: Lipase: 74 U/L — ABNORMAL HIGH (ref 7–60)

## 2020-07-08 LAB — HEPATITIS B CORE ANTIBODY, TOTAL: Hep B Core Total Ab: NONREACTIVE

## 2020-07-08 LAB — HEPATITIS B SURFACE ANTIBODY,QUALITATIVE: Hep B S Ab: REACTIVE — AB

## 2020-07-08 LAB — HEPATITIS B E ANTIBODY: Hep B E Ab: NONREACTIVE

## 2020-07-08 LAB — HEPATITIS A ANTIBODY, TOTAL: Hepatitis A AB,Total: REACTIVE — AB

## 2020-07-12 ENCOUNTER — Other Ambulatory Visit: Payer: Self-pay

## 2020-07-12 ENCOUNTER — Ambulatory Visit (HOSPITAL_BASED_OUTPATIENT_CLINIC_OR_DEPARTMENT_OTHER)
Admission: RE | Admit: 2020-07-12 | Discharge: 2020-07-12 | Disposition: A | Discharge status: Home / Self Care | Source: Ambulatory Visit | Attending: Medical | Admitting: Medical

## 2020-07-12 DIAGNOSIS — K760 Fatty (change of) liver, not elsewhere classified: Secondary | ICD-10-CM | POA: Diagnosis present

## 2020-07-12 DIAGNOSIS — R748 Abnormal levels of other serum enzymes: Secondary | ICD-10-CM | POA: Diagnosis not present

## 2020-07-13 ENCOUNTER — Ambulatory Visit: Admitting: Internal Medicine

## 2020-07-19 ENCOUNTER — Other Ambulatory Visit: Payer: Self-pay

## 2020-07-19 ENCOUNTER — Ambulatory Visit (INDEPENDENT_AMBULATORY_CARE_PROVIDER_SITE_OTHER): Admitting: Internal Medicine

## 2020-07-19 ENCOUNTER — Encounter: Payer: Self-pay | Admitting: Internal Medicine

## 2020-07-19 VITALS — BP 148/90 | HR 94 | Ht 64.0 in | Wt 207.0 lb

## 2020-07-19 DIAGNOSIS — E1165 Type 2 diabetes mellitus with hyperglycemia: Secondary | ICD-10-CM | POA: Diagnosis not present

## 2020-07-19 DIAGNOSIS — E785 Hyperlipidemia, unspecified: Secondary | ICD-10-CM | POA: Diagnosis not present

## 2020-07-19 MED ORDER — ROSUVASTATIN CALCIUM 5 MG PO TABS: 5.0000 mg | ORAL_TABLET | Freq: Every day | ORAL | 3 refills | Status: DC

## 2020-07-19 NOTE — Patient Instructions (Signed)
-  Continuar con Metformina 1000 mg, 1 tableta dos veces al da - Continuar con Glipizide 10 mg, 1 tableta antes del desayuno  - Comience con rosuvastatina (Crestor) 5 mg al da   - Comience a [employer] operational officer por la noche tambin, si constantemente supera los 180 mg / dL, comunquese con la oficina    CMO TRATAR LOS AZCARES BAJOS EN SANGRE (Azcar en sangre MENOS DE 70 MG / DL) Siga la REGLA DE 15 para el tratamiento de la hipoglucemia (cuando su (azcar en sangre es inferior a 70 mg / dL)  PASO 1: Tome 15 gramos de carbohidratos cuando su nivel de azcar en sangre sea bajo, lo que incluye: 3-4 TABLETAS DE GLUCOSA O 3-4 OZ DE JUGO O SODA REGULAR O UN TUBO DE GEL DE GLUCOSA  PASO 2: VUELVA A VERIFICAR el nivel de azcar en sangre en 15 MINUTOS PASO 3: Si su nivel de azcar en sangre an es bajo en la revisin de 15 minutos -> entonces, regrese al PASO 1 y trate OTRA VEZ con otros 15 gramos de carbohidratos.         - Continue Metformin 1000 mg, 1 tablet twice a day - Continue Glipizide 10 mg , 1 tablet before Breakfast   - Start Rosuvastatin ( Crestor ) 5 mg daily    - Start checking sugar at night as well, if consistently over 180 mg/dL, please contact the office      HOW TO TREAT LOW BLOOD SUGARS (Blood sugar LESS THAN 70 MG/DL)  Please follow the RULE OF 15 for the treatment of hypoglycemia treatment (when your (blood sugars are less than 70 mg/dL)    STEP 1: Take 15 grams of carbohydrates when your blood sugar is low, which includes:   3-4 GLUCOSE TABS  OR  3-4 OZ OF JUICE OR REGULAR SODA OR  ONE TUBE OF GLUCOSE GEL     STEP 2: RECHECK blood sugar in 15 MINUTES STEP 3: If your blood sugar is still low at the 15 minute recheck --> then, go back to STEP 1 and treat AGAIN with another 15 grams of carbohydrates.

## 2020-07-19 NOTE — Progress Notes (Signed)
Name: Deborah Henderson  MRN/ DOB: [id number], Oct 20, 1966   Age/ Sex: 54 y.o., female    PCP: Elise [facility]ne   Reason for Endocrinology Evaluation: Type 2 Diabetes Mellitus     Date of Initial Endocrinology Visit: 07/19/2020     PATIENT IDENTIFIER: Deborah Henderson is a 54 y.o. female with a past medical history of T2DM, HTN and dyslipidemia . The patient presented for initial endocrinology clinic visit on 07/19/2020 for consultative assistance with her diabetes management.    HPI: Deborah Henderson is accompanied by interpreter Dory   Diagnosed with DM in 2016 Prior Medications tried/Intolerance: N/A Currently checking blood sugars 1 x / day,  before breakfast  Hypoglycemia episodes : no          Hemoglobin A1c has ranged from 6.5% in 2018, peaking at 11.7% in 2020. Patient required assistance for hypoglycemia: no Patient has required hospitalization within the last 1 year from hyper or hypoglycemia: no  In terms of diet, the patient stopped sweets and sugar-sweeten beverages   Eats 3 meals a day, snacks rarely.    HOME DIABETES REGIMEN: Metformin 1000 mg BID Glipizide 10 mg daily     Statin: No ACE-I/ARB: yes    GLUCOSE LOG: 113- 130  Mg/dL   DIABETIC COMPLICATIONS: Microvascular complications:    Denies: CKD, retinopathy, neuropathy   Last eye exam: Completed 2020  Macrovascular complications:    Denies: CAD, PVD, CVA   PAST HISTORY: Past Medical History:  Past Medical History:  Diagnosis Date  . Diabetes mellitus ([place])    type 2  . Diverticulosis   . Hypertension   . Hypothyroidism   . Obesity   . Sleep apnea    Currently on CPAP machine   Past Surgical History:  Past Surgical History:  Procedure Laterality Date  . CESAREAN SECTION     x2  . CHOLECYSTECTOMY    . CHOLECYSTECTOMY, LAPAROSCOPIC  06/02/2015   [place]  . COLONOSCOPY  02/04/2015   due 2021 per patient   . ECTOPIC PREGNANCY SURGERY    .  ESOPHAGOGASTRODUODENOSCOPY  2015  . OVARIAN CYST SURGERY  1988   Left  . ROBOTIC ASSISTED BILATERAL SALPINGO OOPHERECTOMY N/A 05/21/2017   Procedure: XI ROBOTIC ASSIST  LEFT SALPINGO OOPHORECTOMY, LYSIS OF ADHESIONS;  Surgeon: Nancy Marus, MD;  Location: [facility];  Service: Gynecology;  Laterality: N/A;      Social History:  reports that she quit smoking about 30 years ago. She has never used smokeless tobacco. She reports previous alcohol use. She reports that she does not use drugs. Family History:  Family History  Problem Relation Age of Onset  . Cancer Father        colon  . Diabetes Father   . Prostate cancer Father   . Cancer Brother        bone   . Diabetes Brother   . Diabetes Brother   . Esophageal cancer Neg Hx      HOME MEDICATIONS: Allergies as of 07/19/2020      Reactions   Asa [aspirin] Anaphylaxis, Rash      Medication List       Accurate as of July 19, 2020 10:29 AM. If you have any questions, ask your nurse or doctor.        amoxicillin 875 MG tablet Commonly known as: AMOXIL Take 1 tablet (875 mg total) by mouth 2 (two) times daily.   clarithromycin 500 MG 24 hr tablet Commonly known as: BIAXIN XL Take  2 tablets (1,000 mg total) by mouth daily.   glipiZIDE 10 MG tablet Commonly known as: GLUCOTROL Take 1 tablet (10 mg total) by mouth daily before breakfast.   levothyroxine 25 MCG tablet Commonly known as: SYNTHROID Take 1 tablet (25 mcg total) by mouth daily before breakfast.   losartan-hydrochlorothiazide 50-12.5 MG tablet Commonly known as: HYZAAR Take 1 tablet by mouth daily.   metFORMIN 1000 MG tablet Commonly known as: GLUCOPHAGE Take 1 tablet (1,000 mg total) by mouth 2 (two) times daily with a meal.   omeprazole 40 MG capsule Commonly known as: PRILOSEC Take 1 capsule (40 mg total) by mouth daily.   polyethylene glycol powder 17 GM/SCOOP powder Commonly known as: GLYCOLAX/MIRALAX Take 17 g by mouth 2 (two) times daily as  needed.        ALLERGIES: Allergies  Allergen Reactions  . Asa [Aspirin] Anaphylaxis and Rash     REVIEW OF SYSTEMS: A comprehensive ROS was conducted with the patient and is negative except as per HPI and below:  Review of Systems  Gastrointestinal: Negative for diarrhea and vomiting.  Genitourinary: Positive for frequency.  Endo/Heme/Allergies: Positive for polydipsia.      OBJECTIVE:   VITAL SIGNS: BP (!) 148/90   Pulse 94   Ht 5\' 4"  (1.626 m)   Wt 207 lb (93.9 kg)   SpO2 96%   BMI 35.53 kg/m    PHYSICAL EXAM:  General: Pt appears well and is in NAD  Neck: General: Supple without adenopathy or carotid bruits. Thyroid: Thyroid size normal.  No goiter or nodules appreciated. No thyroid bruit.  Lungs: Clear with good BS bilat with no rales, rhonchi, or wheezes  Heart: RRR with normal S1 and S2 and no gallops; no murmurs; no rub  Abdomen: Normoactive bowel sounds, soft, nontender, without masses or organomegaly palpable  Extremities:  Lower extremities - No pretibial edema. No lesions.  Skin: Normal texture and temperature to palpation.   Neuro: MS is good with appropriate affect, pt is alert and Ox3    DATA REVIEWED:  Lab Results  Component Value Date   HGBA1C 10.2 (H) 06/23/2020   HGBA1C 7.9 (H) 10/29/2019   HGBA1C 11.7 (H) 11/19/2018   Lab Results  Component Value Date   MICROALBUR 0.2 12/25/2017   LDLCALC 85 06/23/2020   CREATININE 0.78 07/07/2020   No results found for: [facility]  Lab Results  Component Value Date   CHOL 144 06/23/2020   HDL 36 (L) 06/23/2020   LDLCALC 85 06/23/2020   TRIG 132 06/23/2020   CHOLHDL 4.0 06/23/2020        ASSESSMENT / PLAN / RECOMMENDATIONS:   1) Type 2 Diabetes Mellitus, Poorly controlled, Without complications - Most recent A1c of 10.2 %. Goal A1c < 7.0 %.   Plan: GENERAL: I have discussed with the patient the pathophysiology of diabetes. We went over the natural progression of the disease. We talked  about both insulin resistance and insulin deficiency. We stressed the importance of lifestyle changes including diet and exercise. I explained the complications associated with diabetes including retinopathy, nephropathy, neuropathy as well as increased risk of cardiovascular disease. We went over the benefit seen with glycemic control.    I explained to the patient that diabetic patients are at higher than normal risk for amputations.  She has made major  Lifestyle changes since her last visit to her PCP, she stopped all sugars, which I have praised her for. Her fasting BG 's have been in 120's .  She was advised to start checking at bedtime as well and if BG's  > 180 mg/dL consistently to contact us and will consider adding a Glipizide for supper.   MEDICATIONS:  Continue Metformin 1000 mg BID  Continue Glipizide 10 mg , daily   EDUCATION / INSTRUCTIONS:  BG monitoring instructions: Patient is instructed to check her blood sugars 2times a day, fasting and bedtime   Call [facility] if: BG persistently < 70  . I reviewed the Rule of 15 for the treatment of hypoglycemia in detail with the patient. Literature supplied.   2) Diabetic complications:   Eye: Does not have known diabetic retinopathy. Pt urged to see an ophthalmologist  Neuro/ Feet: Does not have known diabetic peripheral neuropathy.  Renal: Patient does not have known baseline CKD. She is  on an ACEI/ARB at present.   3) Dyslipidemia:  LDL at 85 mg/dL which is acceptable but we discussed cardiovascular benefits of statins and she is in agreements of this. She was cautioned against myalgia and arthralgia.     Medication Rosuvastatin 5 mg daily    F/U in 3 months   Signed electronically by: Mack Guise, MD  [facility] End[facility]  [address] [place], [place] [address] Phone: [phone] FAX: [phone]   CC: Elise Benne [address] [place] [address] Phone: [phone]  Fax: [phone]    Return to Endocrinology clinic as below: Future Appointments  Date Time Provider [place]  08/01/2020  1:40 PM Lavena Bullion, DO [facility] [place] [facility]  09/08/2020 11:30 AM Princess Bruins, MD [facility]

## 2020-07-21 ENCOUNTER — Other Ambulatory Visit: Payer: Self-pay

## 2020-07-21 ENCOUNTER — Ambulatory Visit (INDEPENDENT_AMBULATORY_CARE_PROVIDER_SITE_OTHER)

## 2020-07-21 DIAGNOSIS — Z23 Encounter for immunization: Secondary | ICD-10-CM

## 2020-07-25 MED FILL — LEVOTHYROXINE SODIUM 25 MCG: 25 | 30 days supply | Qty: 30 | Fill #8

## 2020-07-25 MED FILL — glipiZIDE 10 MG TABS: 10 | 30 days supply | Qty: 30 | Fill #2

## 2020-08-01 ENCOUNTER — Other Ambulatory Visit: Payer: Self-pay

## 2020-08-01 ENCOUNTER — Ambulatory Visit (INDEPENDENT_AMBULATORY_CARE_PROVIDER_SITE_OTHER): Admitting: Gastroenterology

## 2020-08-01 ENCOUNTER — Other Ambulatory Visit (HOSPITAL_BASED_OUTPATIENT_CLINIC_OR_DEPARTMENT_OTHER): Payer: Self-pay | Admitting: Internal Medicine

## 2020-08-01 ENCOUNTER — Encounter: Payer: Self-pay | Admitting: Gastroenterology

## 2020-08-01 ENCOUNTER — Ambulatory Visit: Attending: Internal Medicine

## 2020-08-01 VITALS — BP 132/78 | HR 90 | Ht 64.0 in | Wt 207.1 lb

## 2020-08-01 DIAGNOSIS — K760 Fatty (change of) liver, not elsewhere classified: Secondary | ICD-10-CM

## 2020-08-01 DIAGNOSIS — Z86010 Personal history of colonic polyps: Secondary | ICD-10-CM | POA: Diagnosis not present

## 2020-08-01 DIAGNOSIS — K641 Second degree hemorrhoids: Secondary | ICD-10-CM

## 2020-08-01 DIAGNOSIS — Z23 Encounter for immunization: Secondary | ICD-10-CM

## 2020-08-01 DIAGNOSIS — R7401 Elevation of levels of liver transaminase levels: Secondary | ICD-10-CM | POA: Diagnosis not present

## 2020-08-01 DIAGNOSIS — Z6835 Body mass index (BMI) 35.0-35.9, adult: Secondary | ICD-10-CM

## 2020-08-01 DIAGNOSIS — E669 Obesity, unspecified: Secondary | ICD-10-CM

## 2020-08-01 MED FILL — PFIZER-BIONTECH COVID-19 VA: 30 | 1 days supply | Qty: 0 | Fill #0

## 2020-08-01 MED FILL — OMEPRAZOLE 40 MG CPDR: 40 | 30 days supply | Qty: 30 | Fill #1

## 2020-08-01 NOTE — Patient Instructions (Addendum)
If you are age 53 or older, your body mass index should be between 23-30. Your Body mass index is 35.55 kg/m. If this is out of the aforementioned range listed, please consider follow up with your Primary Care Provider.  If you are age 73 or younger, your body mass index should be between 19-25. Your Body mass index is 35.55 kg/m. If this is out of the aformentioned range listed, please consider follow up with your Primary Care Provider.   Your provider has requested that you go to the basement level for lab work at [facility] [place], [place]. Press "B" on the elevator. The lab is located at the first door on the left as you exit the elevator. Lipase - have lab drawnn in 4 weeks.  H pylori antigen in 4 weeks AFTER completing antibiotics and off of Omeprazole two weeks before.  Follow up with me on 08/29/20 at 3 pm.  It was a pleasure to see you today!  Vito Cirigliano, D.O.

## 2020-08-01 NOTE — Progress Notes (Signed)
P  Chief Complaint:    Symptomatic hemorrhoids  GI History: 54 y.o.femalewith a history of diabetes (A1c 10.2), hypertension, hypothyroidism, diverticulosis initially seen by me on 12/09/18 for symptomatic internal hemorrhoids and constipation. Was treated with trial of topical NTG 0.125% for possible superimposed internal fissure (tender exam, but none seen/palpated). Colonoscopy in 12/2018 n/f diverticulosis, sessile serrated polyps, and internal hemorrhoids. -01/05/2019:  Grade 2 hemorrhoids noted in all positions.  Successful banding of the LL hemorrhoid   Separately, history of elevation in ALT>AST dating back to 2018.  Labs had improved, but more recently uptrending in 06/2020. ALP mildly elevated at 144 in 2020, but has since normalized. Normal albumin, bilirubin, protein, CBC. -CT abdomen/pelvis in 03/2017 n/fmoderate hepatic steatosis without focal liver lesion,ccy, no duct dilatation, normal pancreas and spleen. -Treated for presumptive NAFLD with diet/exercise and plan for repeat labs, but that was all delayed due to COVID-19 pandemic related concerns  -2019: AST/ALT 53/94 and 96/102 -11/19/2018: AST/ALT 167/236 -10/29/2019: AST/ALT 54/90 -06/23/2020: AST/ALT 143/162 -07/07/2020: AST/ALT 98/145, otherwise normal CMP (BG 125).  Viral hepatitis panel negative/normal. -07/12/2020: RUQ [place]: ccy, increased echogenicity c/w steatosis, no duct dilation.  Endoscopic Hx: - Colonoscopy (12/25/2018, Dr Bryan Lemma): SSP x5 (4-11 mm), diverticulosis, internal hemorrhoids. Recommended repeat in 3 years.  -[facility] [place] External hemorrhoids, pandiverticulosis, mild proctitis(bxs benign colonic mucosa), internal hemorrhoids. Suboptimal bowel prep.  HPI:     Patient is a 54 y.o. female presenting to the Gastroenterology Clinic for follow-up.  She was last seen by me on 01/05/2019 with successful banding of the LL hemorrhoid at that time.  Had planned for repeat  in 4 weeks for additional banding, however patient did not return due to COVID-19 pandemic.  She returns today for reevaluation and to discuss whether or not additional banding is needed.  She states she had recurrence of hemorrhoidal sxs 2 months ago, which has improved in last couple of weeks with topical therapy, but she is interested in further hemorrhoid banding.   Previously reported constipation otherwise well controlled.   Labs in 06/23/2020 notable for H. pylori breath test positive.  Was treated with amoxicillin/Biaxin and PPI by PCM. Mildly elevated lipase at that time (74).   Filed Weights   08/01/20 1330  Weight: 207 lb 2 oz (94 kg)     Review of systems:     No chest pain, no SOB, no fevers, no urinary sx   Past Medical History:  Diagnosis Date  . Diabetes mellitus ([place])    type 2  . Diverticulosis   . Hypertension   . Hypothyroidism   . Obesity   . Sleep apnea    Currently on CPAP machine    Patient's surgical history, family medical history, social history, medications and allergies were all reviewed in Epic    Current Outpatient Medications  Medication Sig Dispense Refill  . glipiZIDE (GLUCOTROL) 10 MG tablet Take 1 tablet (10 mg total) by mouth daily before breakfast. 30 tablet 3  . levothyroxine (SYNTHROID) 25 MCG tablet Take 1 tablet (25 mcg total) by mouth daily before breakfast. 30 tablet 11  . losartan-hydrochlorothiazide (HYZAAR) 50-12.5 MG tablet Take 1 tablet by mouth daily. 90 tablet 3  . metFORMIN (GLUCOPHAGE) 1000 MG tablet Take 1 tablet (1,000 mg total) by mouth 2 (two) times daily with a meal. 180 tablet 3  . omeprazole (PRILOSEC) 40 MG capsule Take 1 capsule (40 mg total) by mouth daily. 30 capsule 1  . polyethylene glycol powder (GLYCOLAX/MIRALAX)  17 GM/SCOOP powder Take 17 g by mouth 2 (two) times daily as needed. 3350 g 1  . rosuvastatin (CRESTOR) 5 MG tablet Take 1 tablet (5 mg total) by mouth daily. 90 tablet 3   Current  Facility-Administered Medications  Medication Dose Route Frequency Provider Last Rate Last Admin  . 0.9 %  sodium chloride infusion  500 mL Intravenous Once Diaz Crago V, DO        Physical Exam:     BP 132/78   Pulse 90   Ht 5\' 4"  (1.626 m)   Wt 207 lb 2 oz (94 kg)   BMI 35.55 kg/m   GENERAL:  Pleasant female in NAD PSYCH: : Cooperative, normal affect Musculoskeletal:  Normal muscle tone, normal strength NEURO: Alert and oriented x 3, no focal neurologic deficits Rectal exam: Sensation intact and preserved anal wink. No external anal fissures, external hemorrhoids or skin tags. Normal sphincter tone.  Anoscopy with grade 2 hemorrhoids in the RA and RP positions.  No palpable mass. No blood on the exam glove. (Chaperone: Brooke Harpe, CMA; patient's husband also in room per patient request/permission).    IMPRESSION and PLAN:    #1.  Symptomatic internal hemorrhoids: PROCEDURE NOTE: The patient presents with symptomatic grade 2  hemorrhoids, unresponsive to maximal medical therapy, requesting rubber band ligation of symptomatic hemorrhoidal disease. All risks, benefits and alternative forms of therapy were described and informed consent was obtained.  In the Left Lateral Decubitus position, anoscopic examination revealed grade 2 hemorrhoids in RA/RP all position(s). The anorectum was pre-medicated with RectiCare. The decision was made to band the  RP internal hemorrhoid, and the Foley was used to perform band ligation without complication. Digital anorectal examination was then performed to assure proper positioning of the band, and to adjust the banded tissue as required.  The patient was discharged home without pain or other issues. Dietary and behavioral recommendations were given and along with follow-up instructions.    The following adjunctive treatments were recommended:  -Resume high-fiber diet with fiber supplement (i.e. Citrucel or Benefiber)  with goal for soft stools without straining to have a BM. -Resume adequate fluid intake. - Resume laxative as currently taking  The patient will return in 4 weeks for  follow-up and possible additional banding as required. No complications were encountered and the patient tolerated the procedure well.     #2.  Colon polyps: - Repeat colonoscopy in 2023    3) Fatty liver (NAFLD) 4) Elevated ALT>AST 5) Obesity (BMI 35.5) 6) Diabetes (Hgb A1c >10) Previous discussed labs and imaging. Clinical presentation (history of diabetes, hypertension, body habitus) along with serologic and radiographic findings most suggestive of NAFLD.  -We again had a long discussion today and will plan for diet/exercise, modest weight loss and aggressive tx of underlying DM and HTN. - Change to black coffee - Repeat LAEs in 31months. If still elevated, low threshold for extended serologic evaluation to r/o concomitant liver disease  7) Recent H pylori infection 8) Mildly elevated lipase - Check H pylori stool Ag 4+ weeks after completion of Abx and PPI - Possible that mildly elevated lipase was 2/2 H pylori gastirtis. Can repeat in 4+ weeks with above labs - If lipase still elevated, possible cross sectional imaging.           Lavena Bullion ,DO, FACG 08/01/2020, 1:58 PM

## 2020-08-23 MED FILL — glipiZIDE 10 MG TABS: 10 | 30 days supply | Qty: 30 | Fill #3

## 2020-08-23 MED FILL — LEVOTHYROXINE SODIUM 25 MCG: 25 | 30 days supply | Qty: 30 | Fill #9

## 2020-08-23 MED FILL — METFORMIN HCL 1000 MG TABS: 1000 | 90 days supply | Qty: 180 | Fill #3

## 2020-08-29 ENCOUNTER — Ambulatory Visit (INDEPENDENT_AMBULATORY_CARE_PROVIDER_SITE_OTHER): Admitting: Gastroenterology

## 2020-08-29 ENCOUNTER — Encounter: Payer: Self-pay | Admitting: Gastroenterology

## 2020-08-29 VITALS — BP 120/80 | HR 106 | Ht 64.0 in | Wt 208.0 lb

## 2020-08-29 DIAGNOSIS — K641 Second degree hemorrhoids: Secondary | ICD-10-CM

## 2020-08-29 DIAGNOSIS — K760 Fatty (change of) liver, not elsewhere classified: Secondary | ICD-10-CM | POA: Diagnosis not present

## 2020-08-29 NOTE — Progress Notes (Signed)
P  Chief Complaint:    Symptomatic Internal Hemorrhoids; Hemorrhoid Band Ligation  GI History: 54y.o.femalewith a history of diabetes (A1c 10.2), hypertension, hypothyroidism, diverticulosisinitially seen by me on 12/09/18 for symptomatic internal hemorrhoids and constipation. Was treated with trial of topical NTG 0.125% for possible superimposed internal fissure (tender exam, but none seen/palpated). Colonoscopy in 12/2018 n/f diverticulosis, sessile serrated polyps, and internal hemorrhoids. -01/05/2019: Grade 2 hemorrhoids noted in all positions.  Successful banding of the LL hemorrhoid.  Never returned for subsequent banding due to COVID-19 pandemic -08/01/2020: Return to GI clinic with recurrence of hemorrhoidal symptoms.  Anoscopy with hemorrhoids in RP/RA position.  Successful banding of RP hemorrhoid -08/29/2020: Returns for hemorrhoid banding #3  Separately, history of elevation in ALT>AST dating back to 2018.  Labs had improved, but more recently uptrending in 06/2020. ALP mildly elevated at 144 in 2020, but has since normalized. Normal albumin, bilirubin, protein, CBC. -CT abdomen/pelvis in 03/2017 n/fmoderate hepatic steatosis without focal liver lesion,ccy, no duct dilatation, normal pancreas and spleen. -Treated for presumptive NAFLD with diet/exercise and plan for repeat labs, but that was all delayed due to COVID-19 pandemic related concerns  -2019: AST/ALT 53/94 and 96/102 -11/19/2018: AST/ALT 167/236 -10/29/2019: AST/ALT 54/90 -06/23/2020: AST/ALT 143/162 -07/07/2020: AST/ALT 98/145, otherwise normal CMP (BG 125).  Viral hepatitis panel negative/normal. -07/12/2020: RUQ [place]: ccy, increased echogenicity c/w steatosis, no duct dilation.  Endoscopic Hx: - Colonoscopy (12/25/2018, Dr Bryan Lemma): SSP x5 (4-11 mm), diverticulosis, internal hemorrhoids. Recommended repeat in 3 years. -[facility] [facility] [place] [place] External hemorrhoids, pandiverticulosis,  mild proctitis(bxs benign colonic mucosa), internal hemorrhoids. Suboptimal bowel prep.  HPI:     Patient is a 54 y.o. femalewith a history of symptomatic internal hemorrhoids presenting to the Gastroenterology Clinic for follow-up and ongoing treatment. The patient presents with symptomatic grade 2 hemorrhoids, unresponsive to maximal medical therapy, requesting rubber band ligation of symptomatic hemorrhoidal disease.  No issues with last hemorrhoid banding.  No change in medical or surgical history, medications, allergies, social history since last appointment with me.   Review of systems:     No chest pain, no SOB, no fevers, no urinary sx   Past Medical History:  Diagnosis Date  . Diabetes mellitus ([place])    type 2  . Diverticulosis   . Hypertension   . Hypothyroidism   . Obesity   . Sleep apnea    Currently on CPAP machine    Patient's surgical history, family medical history, social history, medications and allergies were all reviewed in Epic    Current Outpatient Medications  Medication Sig Dispense Refill  . glipiZIDE (GLUCOTROL) 10 MG tablet Take 1 tablet (10 mg total) by mouth daily before breakfast. 30 tablet 3  . levothyroxine (SYNTHROID) 25 MCG tablet Take 1 tablet (25 mcg total) by mouth daily before breakfast. 30 tablet 11  . losartan-hydrochlorothiazide (HYZAAR) 50-12.5 MG tablet Take 1 tablet by mouth daily. 90 tablet 3  . metFORMIN (GLUCOPHAGE) 1000 MG tablet Take 1 tablet (1,000 mg total) by mouth 2 (two) times daily with a meal. 180 tablet 3  . omeprazole (PRILOSEC) 40 MG capsule Take 1 capsule (40 mg total) by mouth daily. 30 capsule 1  . polyethylene glycol powder (GLYCOLAX/MIRALAX) 17 GM/SCOOP powder Take 17 g by mouth 2 (two) times daily as needed. 3350 g 1  . rosuvastatin (CRESTOR) 5 MG tablet Take 1 tablet (5 mg total) by mouth daily. 90 tablet 3   Current Facility-Administered Medications  Medication Dose Route Frequency Provider Last  Rate Last  Admin  . 0.9 %  sodium chloride infusion  500 mL Intravenous Once Chou Busler V, DO        Physical Exam:     There were no vitals taken for this visit.  GENERAL:  Pleasant female in NAD PSYCH: : Cooperative, normal affect Rectal exam: Sensation intact and preserved anal wink.  Grade 2 hemorrhoids noted in RA position. No external anal fissures noted. Normal sphincter tone. No palpable mass. No blood on the exam glove. (Chaperone: Curlene Labrum, CMA).   IMPRESSION and PLAN:    1)  Symptomatic internal hemorrhoids: PROCEDURE NOTE: The patient presents with symptomatic grade 2 hemorrhoids, unresponsive to maximal medical therapy, requesting rubber band ligation of symptomatic hemorrhoidal disease.  All risks, benefits and alternative forms of therapy were described and informed consent was obtained.  In the Left Lateral Decubitus position, anoscopic examination revealed grade 2 hemorrhoids in the RA position(s).  The anorectum was pre-medicated with RectiCare. The decision was made to band the RA internal hemorrhoid, and the [place] was used to perform band ligation without complication.  Digital anorectal examination was then performed to assure proper positioning of the band, and to adjust the banded tissue as required.  The patient was discharged home without pain or other issues.  Dietary and behavioral recommendations were given and along with follow-up instructions.     The following adjunctive treatments were recommended:  -Resume high-fiber diet with fiber supplement (i.e. Citrucel or Benefiber) with goal for soft stools without straining to have a BM. -Resume adequate fluid intake.  The patient will return for additional banding as needed. No complications were encountered and the patient tolerated the procedure well.      2) Colon polyps: - Repeat colonoscopy in 2023  3) Fatty liver (NAFLD) 4) Elevated ALT>AST 5) Obesity (BMI 35.5) 6) Diabetes (Hgb  A1c >10) Previously discussed labs and imaging.Clinical presentation (history of diabetes, hypertension, body habitus) along with serologic and radiographic findings most suggestive of NAFLD.  -Continue current diet/exercise and with goal for modest 10% weight loss along with aggressive tx of underlying DM and HTN. - Change to black coffee - Repeat LAEs in 6 months. If still elevated, low threshold for extended serologic evaluation to r/o concomitant liver disease  7) Recent H pylori infection 8) Mildly elevated lipase - Check H pylori stool Ag 4+ weeks after completion of Abx and PPI (already ordered) - Possible that mildly elevated lipase was 2/2 H pylori gastirtis.  Checking repeat with above labs (already ordered) - If lipase still elevated, possible cross sectional imaging.    RTC in 6 months or sooner prn      Lavena Bullion ,DO, FACG 08/29/2020, 2:50 PM

## 2020-08-29 NOTE — Patient Instructions (Signed)
If you are age 53 or older, your body mass index should be between 23-30. Your Body mass index is 35.7 kg/m. If this is out of the aforementioned range listed, please consider follow up with your Primary Care Provider.  If you are age 60 or younger, your body mass index should be between 19-25. Your Body mass index is 35.7 kg/m. If this is out of the aformentioned range listed, please consider follow up with your Primary Care Provider.    HEMORRHOID BANDING PROCEDURE    FOLLOW-UP CARE   1. The procedure you have had should have been relatively painless since the banding of the area involved does not have nerve endings and there is no pain sensation.  The rubber band cuts off the blood supply to the hemorrhoid and the band may fall off as soon as 48 hours after the banding (the band may occasionally be seen in the toilet bowl following a bowel movement). You may notice a temporary feeling of fullness in the rectum which should respond adequately to plain Tylenol or Motrin.  2. Following the banding, avoid strenuous exercise that evening and resume full activity the next day.  A sitz bath (soaking in a warm tub) or bidet is soothing, and can be useful for cleansing the area after bowel movements.     3. To avoid constipation, take two tablespoons of natural wheat bran, natural oat bran, flax, Benefiber or any over the counter fiber supplement and increase your water intake to 7-8 glasses daily.    4. Unless you have been prescribed anorectal medication, do not put anything inside your rectum for two weeks: No suppositories, enemas, fingers, etc.  5. Occasionally, you may have more bleeding than usual after the banding procedure.  This is often from the untreated hemorrhoids rather than the treated one.  Don't be concerned if there is a tablespoon or so of blood.  If there is more blood than this, lie flat with your bottom higher than your head and apply an ice pack to the area. If the bleeding  does not stop within a half an hour or if you feel faint, call our office at [phone] or go to the emergency room.  6. Problems are not common; however, if there is a substantial amount of bleeding, severe pain, chills, fever or difficulty passing urine (very rare) or other problems, you should call us at [phone] or report to the nearest emergency room.  7. Do not stay seated continuously for more than 2-3 hours for a day or two after the procedure.  Tighten your buttock muscles 10-15 times every two hours and take 10-15 deep breaths every 1-2 hours.  Do not spend more than a few minutes on the toilet if you cannot empty your bowel; instead re-visit the toilet at a later time.    Due to recent changes in healthcare laws, you may see the results of your imaging and laboratory studies on MyChart before your provider has had a chance to review them.  We understand that in some cases there may be results that are confusing or concerning to you. Not all laboratory results come back in the same time frame and the provider may be waiting for multiple results in order to interpret others.  Please give [place] 48 hours in order for your provider to thoroughly review all the results before contacting the office for clarification of your results.   Return to the clinic in 6 months. We will  send you a card in the mail to schedule.  Thank you for choosing me and [facility]  Dr Bryan Lemma

## 2020-09-08 ENCOUNTER — Encounter: Payer: Self-pay | Admitting: Obstetrics & Gynecology

## 2020-09-08 ENCOUNTER — Ambulatory Visit (INDEPENDENT_AMBULATORY_CARE_PROVIDER_SITE_OTHER): Admitting: Obstetrics & Gynecology

## 2020-09-08 ENCOUNTER — Other Ambulatory Visit: Payer: Self-pay

## 2020-09-08 VITALS — BP 136/84 | Ht 63.5 in | Wt 206.0 lb

## 2020-09-08 DIAGNOSIS — Z6835 Body mass index (BMI) 35.0-35.9, adult: Secondary | ICD-10-CM

## 2020-09-08 DIAGNOSIS — Z780 Asymptomatic menopausal state: Secondary | ICD-10-CM | POA: Diagnosis not present

## 2020-09-08 DIAGNOSIS — E1165 Type 2 diabetes mellitus with hyperglycemia: Secondary | ICD-10-CM | POA: Diagnosis not present

## 2020-09-08 DIAGNOSIS — Z01419 Encounter for gynecological examination (general) (routine) without abnormal findings: Secondary | ICD-10-CM | POA: Diagnosis not present

## 2020-09-08 NOTE — Progress Notes (Signed)
Deborah Henderson 1966-04-02 [id number]   History:    54 y.o. G8P2A1L2 Married  HD:[id number] presenting for annual gyn exam   CXK:GYJEHUD Left SO, Lysis of Omental Adhesions on 05/21/2017 with Dr Alycia Rossetti. Patho: Benign mucinous Cystadenoma. No complication. Menopause x 2016, well with mild occasional night sweats, declines HRT. No PMB. No pelvic pain. C/O dryness and pain with IC, improved with coconut oil. Urine/BMs wnl. Breasts wnl. BMI 35.92 improved x last year. Not very physically active.  Decreased calories/carbs in her diet to loose weight. Health Labs with Fam MD.  Past medical history,surgical history, family history and social history were all reviewed and documented in the EPIC chart.  Gynecologic History No LMP recorded. Patient is postmenopausal.  Obstetric History OB History  Gravida Para Term Preterm AB Living  3 2     1 2   SAB TAB Ectopic Multiple Live Births      1        # Outcome Date GA Lbr Len/2nd Weight Sex Delivery Anes PTL Lv  3 Ectopic           2 Para           1 Para              ROS: A ROS was performed and pertinent positives and negatives are included in the history.  GENERAL: No fevers or chills. HEENT: No change in vision, no earache, sore throat or sinus congestion. NECK: No pain or stiffness. CARDIOVASCULAR: No chest pain or pressure. No palpitations. PULMONARY: No shortness of breath, cough or wheeze. GASTROINTESTINAL: No abdominal pain, nausea, vomiting or diarrhea, melena or bright red blood per rectum. GENITOURINARY: No urinary frequency, urgency, hesitancy or dysuria. MUSCULOSKELETAL: No joint or muscle pain, no back pain, no recent trauma. DERMATOLOGIC: No rash, no itching, no lesions. ENDOCRINE: No polyuria, polydipsia, no heat or cold intolerance. No recent change in weight. HEMATOLOGICAL: No anemia or easy bruising or bleeding. NEUROLOGIC: No headache, seizures, numbness, tingling or weakness. PSYCHIATRIC: No  depression, no loss of interest in normal activity or change in sleep pattern.     Exam:   BP 136/84   Ht 5' 3.5" (1.613 m)   Wt 206 lb (93.4 kg)   BMI 35.92 kg/m   Body mass index is 35.92 kg/m.  General appearance : Well developed well nourished female. No acute distress HEENT: Eyes: no retinal hemorrhage or exudates,  Neck supple, trachea midline, no carotid bruits, no thyroidmegaly Lungs: Clear to auscultation, no rhonchi or wheezes, or rib retractions  Heart: Regular rate and rhythm, no murmurs or gallops Breast:Examined in sitting and supine position were symmetrical in appearance, no palpable masses or tenderness,  no skin retraction, no nipple inversion, no nipple discharge, no skin discoloration, no axillary or supraclavicular lymphadenopathy Abdomen: no palpable masses or tenderness, no rebound or guarding Extremities: no edema or skin discoloration or tenderness  Pelvic: Vulva: Normal             Vagina: No gross lesions or discharge  Cervix: No gross lesions or discharge.  Pap reflex done.  Uterus  AV, normal size, shape and consistency, non-tender and mobile  Adnexa  Without masses or tenderness  Anus: Normal   Assessment/Plan:  54 y.o. female for annual exam   1. Encounter for routine gynecological examination with Papanicolaou smear of cervix Normal gynecologic exam in menopause.  Pap reflex done.  Breast exam normal.  Screening mammogram Negative December 2020.  Colonoscopy March 2020.  Health  labs with family physician.  2. Postmenopause Well on no hormone replacement therapy.  No postmenopausal bleeding.  Vitamin D supplements, calcium intake of 1500 mg daily and regular weightbearing physical activities.  3. Type 2 diabetes mellitus with hyperglycemia, without long-term current use of insulin ([place]) Better control as patient loses weight.  4. Class 2 severe obesity due to excess calories with serious comorbidity and body mass index (BMI) of 35.0 to 35.9 in  adult [facility]) Continue with a lower calorie/carb diet.  Aerobic activities 5 times a week and light weightlifting every 2 days.  Princess Bruins MD, 11:31 AM 09/08/2020

## 2020-09-12 LAB — PAP IG W/ RFLX HPV ASCU

## 2020-09-20 ENCOUNTER — Other Ambulatory Visit: Payer: Self-pay | Admitting: Medical

## 2020-09-20 ENCOUNTER — Telehealth: Payer: Self-pay | Admitting: Medical

## 2020-09-20 MED ORDER — GLIPIZIDE 10 MG PO TABS
10.0000 mg | ORAL_TABLET | Freq: Every day | ORAL | 3 refills | Status: DC
Start: 2020-09-20 — End: 2020-09-20

## 2020-09-20 MED FILL — glipiZIDE 10 MG TABS: 10 | 30 days supply | Qty: 30 | Fill #0

## 2020-09-20 MED FILL — LOSARTAN-HCTZ 50-12.5 MG TA: 50-12.5 | 90 days supply | Qty: 90 | Fill #1

## 2020-09-20 MED FILL — LEVOTHYROXINE SODIUM 25 MCG: 25 | 30 days supply | Qty: 30 | Fill #10

## 2020-09-20 NOTE — Telephone Encounter (Signed)
Rx sent 

## 2020-09-20 NOTE — Telephone Encounter (Signed)
Pt came in office requesting refill on glipiZIDE (GLUCOTROL) 10 MG tablet send to [facility]. Pt already verified with pharmacy and not refill available.  Please advise.

## 2020-10-26 MED FILL — glipiZIDE 10 MG TABS: 10 | 30 days supply | Qty: 30 | Fill #1

## 2020-10-26 MED FILL — LEVOTHYROXINE SODIUM 25 MCG: 25 | 30 days supply | Qty: 30 | Fill #11

## 2020-11-01 ENCOUNTER — Ambulatory Visit: Admitting: Internal Medicine

## 2020-11-02 ENCOUNTER — Other Ambulatory Visit: Payer: Self-pay | Admitting: Medical

## 2020-11-02 ENCOUNTER — Telehealth: Payer: Self-pay | Admitting: Medical

## 2020-11-02 ENCOUNTER — Encounter: Payer: Self-pay | Admitting: Medical

## 2020-11-02 MED ORDER — METFORMIN HCL 1000 MG PO TABS: 1000.0000 mg | ORAL_TABLET | Freq: Two times a day (BID) | ORAL | 3 refills | Status: DC

## 2020-11-02 MED ORDER — LEVOTHYROXINE SODIUM 25 MCG PO TABS: 25.0000 ug | ORAL_TABLET | Freq: Every day | ORAL | 11 refills | Status: DC

## 2020-11-02 NOTE — Telephone Encounter (Signed)
Rx metformin and levothyroxine sent to pt pharmacy.

## 2020-11-03 MED FILL — METFORMIN HCL 1000 MG TABS: 1000 | 90 days supply | Qty: 180 | Fill #0

## 2020-11-18 MED FILL — LEVOTHYROXINE SODIUM 25 MCG: 25 | 30 days supply | Qty: 30 | Fill #0

## 2020-12-13 MED FILL — glipiZIDE 10 MG TABS: 10 | 30 days supply | Qty: 30 | Fill #2

## 2020-12-13 MED FILL — LOSARTAN-HCTZ 50-12.5 MG TA: 50-12.5 | 90 days supply | Qty: 90 | Fill #2

## 2021-01-09 MED FILL — LEVOTHYROXINE SODIUM 25 MCG: 25 | 30 days supply | Qty: 30 | Fill #1

## 2021-01-09 MED FILL — glipiZIDE 10 MG TABS: 10 | 30 days supply | Qty: 30 | Fill #3

## 2021-01-21 ENCOUNTER — Other Ambulatory Visit (HOSPITAL_BASED_OUTPATIENT_CLINIC_OR_DEPARTMENT_OTHER): Payer: Self-pay

## 2021-02-19 MED FILL — Levothyroxine Sodium Tab 25 MCG: ORAL | 30 days supply | Qty: 30 | Fill #0 | Status: AC

## 2021-02-19 MED FILL — Metformin HCl Tab 1000 MG: ORAL | 90 days supply | Qty: 180 | Fill #0 | Status: AC

## 2021-02-20 ENCOUNTER — Other Ambulatory Visit (HOSPITAL_BASED_OUTPATIENT_CLINIC_OR_DEPARTMENT_OTHER): Payer: Self-pay

## 2021-02-21 ENCOUNTER — Ambulatory Visit: Attending: Internal Medicine

## 2021-02-21 DIAGNOSIS — Z23 Encounter for immunization: Secondary | ICD-10-CM

## 2021-02-21 NOTE — Progress Notes (Signed)
   Covid-19 Vaccination Clinic  Name:  Deborah Henderson    MRN: [id number] DOB: 27-Nov-1965  02/21/2021  Deborah Henderson was observed post Covid-19 immunization for 15 minutes without incident. She was provided with Vaccine Information Sheet and instruction to access the V-Safe system.   Deborah Henderson was instructed to call 911 with any severe reactions post vaccine: Marland Kitchen Difficulty breathing  . Swelling of face and throat  . A fast heartbeat  . A bad rash all over body  . Dizziness and weakness   Immunizations Administered    Name Date Dose VIS Date Route   PFIZER Comrnaty(Gray TOP) Covid-19 Vaccine 02/21/2021  9:11 AM 0.3 mL 09/29/2020 Intramuscular   Manufacturer: [employer], [employer]   Lot: [id number]   NDC: [phone]

## 2021-02-28 ENCOUNTER — Other Ambulatory Visit (HOSPITAL_BASED_OUTPATIENT_CLINIC_OR_DEPARTMENT_OTHER): Payer: Self-pay

## 2021-02-28 MED ORDER — PFIZER-BIONT COVID-19 VAC-TRIS 30 MCG/0.3ML IM SUSP
INTRAMUSCULAR | 0 refills | Status: DC
  Filled 2021-02-28: qty 0.3, 1d supply, fill #0

## 2021-03-20 MED FILL — Levothyroxine Sodium Tab 25 MCG: ORAL | 30 days supply | Qty: 30 | Fill #1 | Status: AC

## 2021-03-20 MED FILL — Losartan Potassium & Hydrochlorothiazide Tab 50-12.5 MG: ORAL | 90 days supply | Qty: 90 | Fill #0 | Status: AC

## 2021-03-21 ENCOUNTER — Other Ambulatory Visit (HOSPITAL_BASED_OUTPATIENT_CLINIC_OR_DEPARTMENT_OTHER): Payer: Self-pay

## 2021-04-30 MED FILL — Levothyroxine Sodium Tab 25 MCG: ORAL | 30 days supply | Qty: 30 | Fill #2 | Status: AC

## 2021-05-01 ENCOUNTER — Other Ambulatory Visit (HOSPITAL_BASED_OUTPATIENT_CLINIC_OR_DEPARTMENT_OTHER): Payer: Self-pay

## 2021-06-04 MED FILL — Metformin HCl Tab 1000 MG: ORAL | 90 days supply | Qty: 180 | Fill #1 | Status: AC

## 2021-06-04 MED FILL — Levothyroxine Sodium Tab 25 MCG: ORAL | 30 days supply | Qty: 30 | Fill #3 | Status: AC

## 2021-06-05 ENCOUNTER — Other Ambulatory Visit (HOSPITAL_BASED_OUTPATIENT_CLINIC_OR_DEPARTMENT_OTHER): Payer: Self-pay

## 2021-06-20 ENCOUNTER — Other Ambulatory Visit (HOSPITAL_BASED_OUTPATIENT_CLINIC_OR_DEPARTMENT_OTHER): Payer: Self-pay

## 2021-07-05 ENCOUNTER — Other Ambulatory Visit (HOSPITAL_BASED_OUTPATIENT_CLINIC_OR_DEPARTMENT_OTHER): Payer: Self-pay

## 2021-07-05 MED FILL — Levothyroxine Sodium Tab 25 MCG: ORAL | 30 days supply | Qty: 30 | Fill #4 | Status: AC

## 2021-07-14 ENCOUNTER — Other Ambulatory Visit (HOSPITAL_BASED_OUTPATIENT_CLINIC_OR_DEPARTMENT_OTHER): Payer: Self-pay

## 2021-07-14 ENCOUNTER — Other Ambulatory Visit: Payer: Self-pay | Admitting: Internal Medicine

## 2021-07-14 ENCOUNTER — Other Ambulatory Visit: Payer: Self-pay

## 2021-07-14 ENCOUNTER — Encounter: Payer: Self-pay | Admitting: Medical

## 2021-07-14 ENCOUNTER — Other Ambulatory Visit: Payer: Self-pay | Admitting: Medical

## 2021-07-14 ENCOUNTER — Ambulatory Visit (INDEPENDENT_AMBULATORY_CARE_PROVIDER_SITE_OTHER): Admitting: Medical

## 2021-07-14 VITALS — BP 145/89 | HR 87 | Resp 18 | Ht 63.0 in | Wt 216.4 lb

## 2021-07-14 DIAGNOSIS — E785 Hyperlipidemia, unspecified: Secondary | ICD-10-CM | POA: Diagnosis not present

## 2021-07-14 DIAGNOSIS — E039 Hypothyroidism, unspecified: Secondary | ICD-10-CM | POA: Diagnosis not present

## 2021-07-14 DIAGNOSIS — E1165 Type 2 diabetes mellitus with hyperglycemia: Secondary | ICD-10-CM | POA: Diagnosis not present

## 2021-07-14 DIAGNOSIS — R748 Abnormal levels of other serum enzymes: Secondary | ICD-10-CM | POA: Diagnosis not present

## 2021-07-14 DIAGNOSIS — Z23 Encounter for immunization: Secondary | ICD-10-CM | POA: Diagnosis not present

## 2021-07-14 DIAGNOSIS — Z0000 Encounter for general adult medical examination without abnormal findings: Secondary | ICD-10-CM

## 2021-07-14 LAB — CBC WITH DIFFERENTIAL/PLATELET
Basophils Absolute: 0 10*3/uL (ref 0.0–0.1)
Basophils Relative: 0.4 % (ref 0.0–3.0)
Eosinophils Absolute: 0.1 10*3/uL (ref 0.0–0.7)
Eosinophils Relative: 0.8 % (ref 0.0–5.0)
HCT: 39.4 % (ref 36.0–46.0)
Hemoglobin: 13.1 g/dL (ref 12.0–15.0)
Lymphocytes Relative: 34.5 % (ref 12.0–46.0)
Lymphs Abs: 2.4 10*3/uL (ref 0.7–4.0)
MCHC: 33.2 g/dL (ref 30.0–36.0)
MCV: 89.1 fl (ref 78.0–100.0)
Monocytes Absolute: 0.5 10*3/uL (ref 0.1–1.0)
Monocytes Relative: 7.2 % (ref 3.0–12.0)
Neutro Abs: 4 10*3/uL (ref 1.4–7.7)
Neutrophils Relative %: 57.1 % (ref 43.0–77.0)
Platelets: 178 10*3/uL (ref 150.0–400.0)
RBC: 4.42 Mil/uL (ref 3.87–5.11)
RDW: 14.6 % (ref 11.5–15.5)
WBC: 7 10*3/uL (ref 4.0–10.5)

## 2021-07-14 LAB — COMPREHENSIVE METABOLIC PANEL
ALT: 59 U/L — ABNORMAL HIGH (ref 0–35)
AST: 60 U/L — ABNORMAL HIGH (ref 0–37)
Albumin: 4.2 g/dL (ref 3.5–5.2)
Alkaline Phosphatase: 85 U/L (ref 39–117)
BUN: 12 mg/dL (ref 6–23)
CO2: 24 mEq/L (ref 19–32)
Calcium: 9.4 mg/dL (ref 8.4–10.5)
Chloride: 104 mEq/L (ref 96–112)
Creatinine, Ser: 0.7 mg/dL (ref 0.40–1.20)
GFR: 97.95 mL/min (ref >60.00)
Glucose, Bld: 186 mg/dL — ABNORMAL HIGH (ref 70–99)
Potassium: 3.9 mEq/L (ref 3.5–5.1)
Sodium: 137 mEq/L (ref 135–145)
Total Bilirubin: 0.6 mg/dL (ref 0.2–1.2)
Total Protein: 6.6 g/dL (ref 6.0–8.3)

## 2021-07-14 LAB — LIPID PANEL
Cholesterol: 89 mg/dL (ref 0–200)
HDL: 28.9 mg/dL — ABNORMAL LOW (ref >39.00)
LDL Cholesterol: 35 mg/dL (ref 0–99)
NonHDL: 59.7
Total CHOL/HDL Ratio: 3
Triglycerides: 122 mg/dL (ref 0.0–149.0)
VLDL: 24.4 mg/dL (ref 0.0–40.0)

## 2021-07-14 LAB — HEMOGLOBIN A1C: Hgb A1c MFr Bld: 10.6 % — ABNORMAL HIGH (ref 4.6–6.5)

## 2021-07-14 LAB — LIPASE: Lipase: 52 U/L (ref 11.0–59.0)

## 2021-07-14 LAB — T4, FREE: Free T4: 0.55 ng/dL — ABNORMAL LOW (ref 0.60–1.60)

## 2021-07-14 LAB — TSH: TSH: 2.52 u[IU]/mL (ref 0.35–5.50)

## 2021-07-14 MED ORDER — GLIPIZIDE 10 MG PO TABS
ORAL_TABLET | Freq: Every day | ORAL | 3 refills | Status: DC
  Filled 2021-07-14: qty 30, 30d supply, fill #0

## 2021-07-14 MED ORDER — LOSARTAN POTASSIUM-HCTZ 50-12.5 MG PO TABS
1.0000 | ORAL_TABLET | Freq: Every day | ORAL | 3 refills | Status: DC
  Filled 2021-07-14: qty 90, 90d supply, fill #0
  Filled 2021-10-04: qty 90, 90d supply, fill #1
  Filled 2021-12-13: qty 90, 90d supply, fill #2
  Filled 2022-03-28: qty 90, 90d supply, fill #3

## 2021-07-14 NOTE — Addendum Note (Signed)
Addended by: Jeronimo Greaves on: 07/14/2021 09:10 AM   Modules accepted: Orders

## 2021-07-14 NOTE — Patient Instructions (Addendum)
For you wellness exam today I have ordered cbc, cmp and  lipid panel.  Vaccine given today flu vaccine. Pt will schedule shingrix nurse visit other day.  Recommend exercise and healthy diet.  We will let you know lab results as they come in.  Follow up date appointment will be determined after lab review.    For htn refill losartan. Check every other day but daily. If ha present check bp daily. If bp over 150/90 notify me. Ask you schedule nurse bp check in one week. If ha severe or motor/sensory deficits then ED evaluation.  For diabetes getting a1c and microalubmin. Refilling your glipizide and will send a1c results to your endocrinologist.  For cholesterol may adjust crestor dose if levels elevated.  Check tsh and t4 level today.  On review hx of colon polyp. Repeat your repeat in 2023.  Last year elevated lipase. Will repeat that today.  Cuidados preventivos en las mujeres de 75 a 50 aos de edad Preventive Care 11-49 Years Old, Female Los cuidados preventivos hacen referencia a las opciones en cuanto al estilo de vida y a las visitas al mdico, las cuales pueden promover la salud y Musician. Esto puede comprender lo siguiente: Un examen fsico anual. Esto tambin se conoce como visita de control de bienestar anual. Exmenes dentales y oculares de [place] regular. Vacunas. Estudios para Health and safety inspector. Elecciones para un estilo de vida saludable, por ejemplo: Seguir una dieta saludable. Practicar actividad fsica con regularidad. No consumir drogas ni productos que contengan nicotina y tabaco. Limitar el consumo de bebidas alcohlicas. Qu puedo esperar para mi visita de cuidado preventivo? Examen fsico El mdico revisar lo siguiente: IT consultant y [place]. Estos pueden usarse para calcular el IMC (ndice de masa corporal). El [facility] es una medicin que indica si tiene un peso saludable. Frecuencia cardaca y presin arterial. Temperatura corporal. Piel para  detectar manchas anormales. Asesoramiento Su mdico puede preguntarle acerca de: Problemas mdicos pasados. Antecedentes mdicos familiares. Consumo de tabaco, alcohol y drogas. Su bienestar emocional. Restaurant manager, fast food y las relaciones personales. Su actividad sexual. Hbitos de alimentacin, ejercicio y sueo. Su trabajo y [place] laboral. Acceso a armas de fuego. Mtodos anticonceptivos. Ciclo menstrual. Antecedentes de embarazo. Qu vacunas necesito? Las vacunas se aplican a varias edades, segn un calendario. El [employer] recomendar vacunas segn su edad, sus antecedentes mdicos, su estilo de vida y otros factores, como los viajes o el lugar donde trabaja. Qu pruebas necesito? Anlisis de [place] de lpidos y colesterol. Estos se pueden verificar cada 5 aos, o ms a menudo, si usted tiene ms de 58 aos de edad. Anlisis de hepatitis C. Anlisis de hepatitis B. Pruebas de deteccin Pruebas de deteccin de cncer de pulmn. Es posible que se le realice esta prueba de deteccin a partir de los 77 aos de edad, si ha fumado durante 30 aos un paquete diario y sigue fumando o dej el hbito en algn momento en los ltimos 15 aos. Pruebas de Programme researcher, broadcasting/film/video de Surveyor, minerals. Todos los adultos a partir de los 1 aos de edad y [place] 68 aos de edad deben hacerse esta prueba de deteccin. El mdico puede recomendarle las pruebas de deteccin a partir de los 10 aos de edad si corre un mayor riesgo. Le realizarn pruebas cada 1 a 10 aos, segn los [place] y el tipo de prueba de Programme researcher, broadcasting/film/video. Pruebas de deteccin de la diabetes. Esto se realiza mediante un control del azcar en la sangre (glucosa) despus  de no haber comido durante un periodo de tiempo (ayuno). Es posible que se le realice esta prueba cada 1 a 3 aos. Mamografa. Se puede realizar cada 1 o 2 aos. Hable con su mdico sobre cundo debe comenzar a Engineer, manufacturing de [place] regular. Esto  depende de si tiene antecedentes familiares de cncer de mama o no. Pruebas de deteccin de cncer relacionado con las mutaciones del BRCA. Es posible que se las deba realizar si tiene antecedentes de cncer de mama, de ovario, de trompas o peritoneal. Examen plvico y prueba de Papanicolaou. Esto se puede Optometrist cada 3 aos a partir de los 21 aos de [place]. A partir de los 30 aos, esto se puede Optometrist cada 5 aos si usted se realiza una prueba de Papanicolaou en combinacin con una prueba de deteccin del virus del papiloma humano (VPH). Otras pruebas Pruebas de enfermedades de transmisin sexual (ETS), si est en riesgo. Densitometra sea. Esto se realiza para detectar osteoporosis. Se le puede realizar este examen de deteccin si tiene un riesgo alto de tener osteoporosis. Hable con su mdico [employer], las opciones de tratamiento y, si corresponde, la necesidad de Optometrist ms pruebas. Siga estas instrucciones en su casa: Comida y bebida  Siga una dieta que incluya frutas y verduras frescas, cereales integrales, protenas magras y productos lcteos descremados. Tome los suplementos vitamnicos y [employer] se lo haya indicado el mdico. No beba alcohol si: Su mdico le indica no hacerlo. Est embarazada, puede estar embarazada o est tratando de [place]. Si bebe alcohol: Limite la cantidad que consume de 0 a 1 medida por da. Est atenta a la cantidad de alcohol que hay en las bebidas que toma. En los [place], una medida equivale a una botella de cerveza de 12 oz (355 ml), un vaso de vino de 5 oz (148 ml) o un vaso de una bebida alcohlica de alta graduacin de 1 oz (44 ml). Estilo de [employer] y las encas a diario. Cepllese los dientes a la maana y a la noche con pasta dental con fluoruro. Use hilo dental una vez al da. Mantngase activa. Haga al menos 30 minutos de ejercicio, 5 o ms das cada semana. No consuma  ningn producto que contenga nicotina o tabaco, como cigarrillos, cigarrillos electrnicos y tabaco de Higher education careers adviser. Si necesita ayuda para dejar de fumar, consulte al mdico. No consuma drogas. Si es sexualmente activa, practique sexo seguro. Use un condn u otra forma de proteccin para prevenir las ITS (infecciones de transmisin sexual). Si no desea quedar embarazada, use un mtodo anticonceptivo. Si busca un embarazo, realice una consulta previa al [employer] con el mdico. Si el mdico se lo indic, tome una dosis baja de aspirina diariamente a partir de los 63 aos de [place]. Encuentre formas saludables de lidiar con el estrs tales como: Meditacin, yoga o Conservation officer, nature. Lleve un diario personal. Hable con una persona confiable. Pase tiempo con amigos y familiares. Seguridad [place] siempre el cinturn de seguridad al conducir o viajar en un vehculo. No conduzca: Si ha estado bebiendo alcohol. No viaje con un conductor que ha estado bebiendo. Si est cansada o distrada. Mientras est enviando mensajes de texto. Use un casco y otros equipos de proteccin [place] deportivas. Si tiene armas de fuego en su casa, asegrese de seguir todos los procedimientos de seguridad correspondientes. Cundo volver? Visite al mdico una vez al ao para una visita anual de control de bienestar. Pregntele  al mdico con qu frecuencia debe realizarse un control de la vista y los dientes. Mantenga su esquema de vacunacin al da. Esta informacin no tiene Marine scientist el consejo del mdico. Asegrese de hacerle al mdico cualquier pregunta que tenga. Document Revised: 08/10/2020 Document Reviewed: 08/10/2020 Elsevier Patient Education  2022 [employer].

## 2021-07-14 NOTE — Progress Notes (Addendum)
Subjective:    Patient ID: Deborah Henderson, female    DOB: Mar 19, 1966, 55 y.o.   MRN: [id number]  HPI  Pt in for cpe/wellness.   Pt has not been exercising. She has a lot of [place] Pt states she has been eating healthy and watching portions since diabetic. No smoker. No alcohol.   Pt states will get flu vaccine today.  Has had 4 covid vaccines.  Pt has order for mammogram. Will get done.  Pt has optometrist.   Declined shingrix then stated will schedule later.   Pt has htn. She ran out of losartan one week ago. Bp level is high. Mild headache today but no gross motor or sensory deficits.  Hx of diabetes. About one year since last a1c. Level was a1c was 10.2. She ran out of glipizide.   Pt also has low thyroid. On supplementaion.    Review of Systems  Constitutional:  Negative for chills, fatigue and fever.  HENT:  Negative for congestion, ear pain and mouth sores.   Respiratory:  Negative for cough, chest tightness and shortness of breath.   Cardiovascular:  Negative for chest pain.  Gastrointestinal:  Negative for abdominal pain, constipation, diarrhea, nausea and vomiting.  Genitourinary:  Negative for difficulty urinating, dysuria and frequency.  Musculoskeletal:  Negative for arthralgias, back pain and gait problem.  Skin:  Negative for rash.  Neurological:  Positive for headaches. Negative for dizziness, seizures, syncope, speech difficulty and weakness.       Mild this morning.  Hematological:  Negative for adenopathy. Does not bruise/bleed easily.  Psychiatric/Behavioral:  Negative for behavioral problems, decreased concentration, sleep disturbance and suicidal ideas. The patient is not nervous/anxious.     Past Medical History:  Diagnosis Date   Diabetes mellitus ([place])    type 2   Diverticulosis    Hypertension    Hypothyroidism    Obesity    Sleep apnea    Currently on CPAP machine     Social History    Socioeconomic History   Marital status: Married    Spouse name: Not on file   Number of children: 2   Years of education: Not on file   Highest education level: Not on file  Occupational History   Occupation: Housewife   Tobacco Use   Smoking status: Former    Types: Cigarettes    Quit date: 11/09/1989    Years since quitting: 31.6   Smokeless tobacco: Never   Tobacco comments:    Quit >20 years ago  Vaping Use   Vaping Use: Never used  Substance and Sexual Activity   Alcohol use: Not Currently   Drug use: No   Sexual activity: Yes    Partners: Male  Other Topics Concern   Not on file  Social History Narrative   Not on file   Social Determinants of Health   Financial Resource Strain: Not on file  Food Insecurity: Not on file  Transportation Needs: Not on file  Physical Activity: Not on file  Stress: Not on file  Social Connections: Not on file  Intimate Partner Violence: Not on file    Past Surgical History:  Procedure Laterality Date   CESAREAN SECTION     x2   CHOLECYSTECTOMY     CHOLECYSTECTOMY, LAPAROSCOPIC  06/02/2015   [place]   COLONOSCOPY  02/04/2015   due 2021 per patient    ECTOPIC PREGNANCY SURGERY     ESOPHAGOGASTRODUODENOSCOPY  2015   OVARIAN  CYST SURGERY  1988   Left   ROBOTIC ASSISTED BILATERAL SALPINGO OOPHERECTOMY N/A 05/21/2017   Procedure: XI ROBOTIC ASSIST  LEFT SALPINGO OOPHORECTOMY, LYSIS OF ADHESIONS;  Surgeon: Nancy Marus, MD;  Location: [facility];  Service: Gynecology;  Laterality: N/A;    Family History  Problem Relation Age of Onset   Cancer Father        colon   Diabetes Father    Prostate cancer Father    Cancer Brother        bone    Diabetes Brother    Diabetes Brother    Esophageal cancer Neg Hx     Allergies  Allergen Reactions   Asa [Aspirin] Anaphylaxis and Rash    Current Outpatient Medications on File Prior to Visit  Medication Sig Dispense Refill   COVID-19 mRNA Vac-TriS, Pfizer, (PFIZER-BIONT  COVID-19 VAC-TRIS) SUSP injection Inject into the muscle. 0.3 mL 0   COVID-19 mRNA vaccine, Pfizer, 30 MCG/0.3ML injection AS DIRECTED .3 mL 0   glipiZIDE (GLUCOTROL) 10 MG tablet TAKE 1 TABLET (10 MG TOTAL) BY MOUTH DAILY BEFORE BREAKFAST. 30 tablet 3   levothyroxine (SYNTHROID) 25 MCG tablet TAKE 1 TABLET (25 MCG TOTAL) BY MOUTH DAILY BEFORE BREAKFAST. 30 tablet 11   metFORMIN (GLUCOPHAGE) 1000 MG tablet TAKE 1 TABLET (1,000 MG TOTAL) BY MOUTH 2 (TWO) TIMES DAILY WITH A MEAL. 180 tablet 3   polyethylene glycol powder (GLYCOLAX/MIRALAX) 17 GM/SCOOP powder Take 17 g by mouth 2 (two) times daily as needed. (Patient taking differently: Take 17 g by mouth as needed.) 3350 g 1   losartan-hydrochlorothiazide (HYZAAR) 50-12.5 MG tablet TAKE 1 TABLET BY MOUTH DAILY. 90 tablet 3   omeprazole (PRILOSEC) 40 MG capsule TAKE 1 CAPSULE (40 MG TOTAL) BY MOUTH DAILY. 30 capsule 1   Current Facility-Administered Medications on File Prior to Visit  Medication Dose Route Frequency Provider Last Rate Last Admin   0.9 %  sodium chloride infusion  500 mL Intravenous Once Cirigliano, Vito V, DO        BP (!) 156/87   Pulse 87   Resp 18   Ht 5\' 3"  (1.6 m)   Wt 216 lb 6.4 oz (98.2 kg)   SpO2 97%   BMI 38.33 kg/m       Objective:   Physical Exam   General Mental Status- Alert. General Appearance- Not in acute distress.   Skin General: Color- Normal Color. Moisture- Normal Moisture.  Neck Carotid Arteries- Normal color. Moisture- Normal Moisture. No carotid bruits. No JVD. No thyromegaly.  Chest and Lung Exam Auscultation: Breath Sounds:-Normal.  Cardiovascular Auscultation:Rythm- Regular. Murmurs & Other Heart Sounds:Auscultation of the heart reveals- No Murmurs.  Abdomen Inspection:-Inspeection Normal. Palpation/Percussion:Note:No mass. Palpation and Percussion of the abdomen reveal- Non Tender, Non Distended + BS, no rebound or guarding.    Neurologic Cranial Nerve exam:- CN III-XII  intact(No nystagmus), symmetric smile. Drift Test:- No drift. Romberg Exam:- Negative.  Heal to Toe Gait exam:-Normal. Finger to Nose:- Normal/Intact Strength:- 5/5 equal and symmetric strength both upper and lower extremities.   Feet exam- see quality metrics.     Assessment & Plan:   Patient Instructions  For you wellness exam today I have ordered cbc, cmp and  lipid panel.  Vaccine given today flu vaccine. Pt will schedule shingrix nurse visit other day.  Recommend exercise and healthy diet.  We will let you know lab results as they come in.  Follow up date appointment will be determined after lab review.  For htn refill losartan. Check every other day but daily. If ha present check bp daily. If bp over 150/90 notify me. Ask you schedule nurse bp check in one week. If ha severe or motor/sensory deficits then ED evaluation.  For diabetes getting a1c and microalubmin. Refilling your glipizide and will send a1c results to your endocrinologist.  For cholesterol may adjust crestor dose if levels elevated.  Check tsh and t4 level today.  On review hx of colon polyp. Repeat your repeat in 2023.    [id number] visit today in addition to wellness exam as pt not seen in one year. Overdue for a1c, bp elevated and has ha. Also on review saw elevated lipase last year. So repeat and follow lipase level.

## 2021-07-15 LAB — MICROALBUMIN, URINE: Microalb, Ur: 0.7 mg/dL

## 2021-07-18 ENCOUNTER — Other Ambulatory Visit (HOSPITAL_BASED_OUTPATIENT_CLINIC_OR_DEPARTMENT_OTHER): Payer: Self-pay

## 2021-07-24 NOTE — Progress Notes (Signed)
Name: Deborah Henderson  Age/ Sex: 55 y.o., female   MRN/ DOB: [id number], [birth year]     PCP: Elise Benne   Reason for Endocrinology Evaluation: Type 2 Diabetes Mellitus  Initial Endocrine Consultative Visit: 07/19/2020    PATIENT IDENTIFIER: [name] is a 55 y.o. female with a past medical history of T2Dm, HTN and dyslipidemia. The patient has followed with Endocrinology clinic since 07/19/2020 for consultative assistance with management of her diabetes.  DIABETIC HISTORY:  [name] was diagnosed with DM in 2016 . Her hemoglobin A1c has ranged from 6.5% in 2018, peaking at 11.7% in 2020.   On her initial visit to our clinic she had an A1c of  SUBJECTIVE:   During the last visit (07/19/2020): A1c 10.2 %   Today (07/25/2021): [name] is here for a follow up on diabetes management. She has not been to our clinic in 13 months.  She have been traveling.    She checks her blood sugars 0 times daily. The patient has unknown  hypoglycemic episodes since the last clinic visit.   Has Polyuria due to diuretics ,  Denies polydipsia  Denies N/V   She had intermittent intake of Glipizide , was out of a month   HOME DIABETES REGIMEN:  Metformin 1000 mg BID  Glipizide 10 mg daily   Statin: yes  ACE-I/ARB: yes    METER DOWNLOAD SUMMARY: does not check     DIABETIC COMPLICATIONS: Microvascular complications:   Denies: CKD, retinopathy, CKD Last Eye Exam: Completed 07/2020  Macrovascular complications:  Denies: CAD, CVA, PVD   HISTORY:  Past Medical History:  Past Medical History:  Diagnosis Date   Diabetes mellitus ([place])    type 2   Diverticulosis    Hypertension    Hypothyroidism    Obesity    Sleep apnea    Currently on CPAP machine   Past Surgical History:  Past Surgical History:  Procedure Laterality Date   CESAREAN SECTION     x2   CHOLECYSTECTOMY     CHOLECYSTECTOMY, LAPAROSCOPIC  06/02/2015   [place]    COLONOSCOPY  02/04/2015   due 2021 per patient    ECTOPIC PREGNANCY SURGERY     ESOPHAGOGASTRODUODENOSCOPY  2015   OVARIAN CYST SURGERY  1988   Left   ROBOTIC ASSISTED BILATERAL SALPINGO OOPHERECTOMY N/A 05/21/2017   Procedure: XI ROBOTIC ASSIST  LEFT SALPINGO OOPHORECTOMY, LYSIS OF ADHESIONS;  Surgeon: Nancy Marus, MD;  Location: [facility];  Service: Gynecology;  Laterality: N/A;   Social History:  reports that she quit smoking about 31 years ago. Her smoking use included cigarettes. She has never used smokeless tobacco. She reports that she does not currently use alcohol. She reports that she does not use drugs. Family History:  Family History  Problem Relation Age of Onset   Cancer Father        colon   Diabetes Father    Prostate cancer Father    Cancer Brother        bone    Diabetes Brother    Diabetes Brother    Esophageal cancer Neg Hx      HOME MEDICATIONS: Allergies as of 07/25/2021       Reactions   Asa [aspirin] Anaphylaxis, Rash        Medication List        Accurate as of July 25, 2021  7:44 AM. If you have any questions, ask your nurse or doctor.  dapagliflozin propanediol 5 MG Tabs tablet Commonly known as: Farxiga Tome 1 tableta (5 mg en total) por va oral diariamente antes de desayunar. (Take 1 tablet (5 mg total) by mouth daily before breakfast.) Started by: Dorita Sciara, MD   glipiZIDE 10 MG tablet Commonly known as: GLUCOTROL Take 1 tablet (10 mg total) by mouth 2 (two) times daily before a meal. What changed:  how much to take when to take this Changed by: Dorita Sciara, MD   levothyroxine 25 MCG tablet Commonly known as: SYNTHROID TAKE 1 TABLET (25 MCG TOTAL) BY MOUTH DAILY BEFORE BREAKFAST.   losartan-hydrochlorothiazide 50-12.5 MG tablet Commonly known as: HYZAAR Tome 1 tableta por va oral diariamente. (TAKE 1 TABLET BY MOUTH DAILY.)   metFORMIN 1000 MG tablet Commonly known as: GLUCOPHAGE Take 1  tablet (1,000 mg total) by mouth 2 (two) times daily with a meal. What changed: how much to take Changed by: Dorita Sciara, MD   omeprazole 40 MG capsule Commonly known as: PRILOSEC TAKE 1 CAPSULE (40 MG TOTAL) BY MOUTH DAILY.   Pfizer-BioNTech COVID-19 Vacc 30 MCG/0.3ML injection Generic drug: COVID-19 mRNA vaccine (Pfizer) AS DIRECTED   Pfizer-BioNT COVID-19 Vac-TriS Susp injection Generic drug: COVID-19 mRNA Vac-TriS (Pfizer) Inject into the muscle.   polyethylene glycol powder 17 GM/SCOOP powder Commonly known as: GLYCOLAX/MIRALAX Take 17 g by mouth 2 (two) times daily as needed. What changed: when to take this   rosuvastatin 5 MG tablet Commonly known as: CRESTOR TAKE ONE TABLET BY MOUTH DAILY         OBJECTIVE:   Vital Signs: BP 138/72 (BP Location: Left Arm, Patient Position: Sitting, Cuff Size: Small)   Pulse 90   Ht 5\' 3"  (1.6 m)   Wt 212 lb 6.4 oz (96.3 kg)   SpO2 97%   BMI 37.62 kg/m   Wt Readings from Last 3 Encounters:  07/25/21 212 lb 6.4 oz (96.3 kg)  07/14/21 216 lb 6.4 oz (98.2 kg)  09/08/20 206 lb (93.4 kg)     Exam: General: Pt appears well and is in NAD  Neck: General: Supple without adenopathy. Thyroid: Thyroid size normal.  No goiter or nodules appreciate.   Lungs: Clear with good BS bilat with no rales, rhonchi, or wheezes  Heart: RRR   Abdomen: Normoactive bowel sounds, soft, nontender, without masses or organomegaly palpable  Extremities: No pretibial edema.   Neuro: MS is good with appropriate affect, pt is alert and Ox3     DATA REVIEWED:  Lab Results  Component Value Date   HGBA1C 10.6 (H) 07/14/2021   HGBA1C 10.2 (H) 06/23/2020   HGBA1C 7.9 (H) 10/29/2019   Lab Results  Component Value Date   MICROALBUR 0.7 07/14/2021   LDLCALC 35 07/14/2021   CREATININE 0.70 07/14/2021   No results found for: [facility]   Lab Results  Component Value Date   CHOL 89 07/14/2021   HDL 28.90 (L) 07/14/2021   LDLCALC 35  07/14/2021   TRIG 122.0 07/14/2021   CHOLHDL 3 07/14/2021         ASSESSMENT / PLAN / RECOMMENDATIONS:   1) Type 2 Diabetes Mellitus, Poorly controlled, With out complications - Most recent A1c of 10.6 %. Goal A1c < 7.0 %.     - I have offered insulin due to persistent hyperglycemia , but she declines and would rather stay on Glipizide.  - We discussed adding SGLT-2 inhibitors, cautioned against genital infections   - Discussed risk of blindness, CKD, neuropathy  and amputations     MEDICATIONS: Continue Metformin 1000 mg BID INcrease Glipizide 10 mg BID  Start Farxiga 5 mg daily   EDUCATION / INSTRUCTIONS: BG monitoring instructions: Patient is instructed to check her blood sugars 1 times a day, fasting . Call [place] [facility] clinic if: BG persistently < 70  I reviewed the Rule of 15 for the treatment of hypoglycemia in detail with the patient. Literature supplied.    2) Diabetic complications:  Eye: Does not have known diabetic retinopathy.  Neuro/ Feet: Does not have known diabetic peripheral neuropathy .  Renal: Patient does not have known baseline CKD. She   is  on an ACEI/ARB at present.    3) Dyslipidemia :   - Lipid panel at goal , needs refill on Rosuvastatin  - LFT's trending down   Medication  Rosuvastatin 5 mg daily      F/U in 4 months     Signed electronically by: Mack Guise, MD  [facility]  [facility] [address] [place], [place] [address] Phone: [phone] FAX: [phone]   CC: Elise Benne [address] [place] [address] Phone: [phone]  Fax: [phone]  Return to Endocrinology clinic as below: Future Appointments  Date Time Provider [place]  10/27/2021 10:00 AM Princess Bruins, MD [facility] None

## 2021-07-25 ENCOUNTER — Other Ambulatory Visit (HOSPITAL_BASED_OUTPATIENT_CLINIC_OR_DEPARTMENT_OTHER): Payer: Self-pay

## 2021-07-25 ENCOUNTER — Ambulatory Visit (INDEPENDENT_AMBULATORY_CARE_PROVIDER_SITE_OTHER): Admitting: Internal Medicine

## 2021-07-25 ENCOUNTER — Encounter: Payer: Self-pay | Admitting: Internal Medicine

## 2021-07-25 ENCOUNTER — Other Ambulatory Visit: Payer: Self-pay

## 2021-07-25 VITALS — BP 138/72 | HR 90 | Ht 63.0 in | Wt 212.4 lb

## 2021-07-25 DIAGNOSIS — E785 Hyperlipidemia, unspecified: Secondary | ICD-10-CM | POA: Diagnosis not present

## 2021-07-25 DIAGNOSIS — E1165 Type 2 diabetes mellitus with hyperglycemia: Secondary | ICD-10-CM | POA: Diagnosis not present

## 2021-07-25 MED ORDER — DAPAGLIFLOZIN PROPANEDIOL 5 MG PO TABS
5.0000 mg | ORAL_TABLET | Freq: Every day | ORAL | 6 refills | Status: DC
  Filled 2021-07-25: qty 30, 30d supply, fill #0

## 2021-07-25 MED ORDER — GLIPIZIDE 10 MG PO TABS
10.0000 mg | ORAL_TABLET | Freq: Two times a day (BID) | ORAL | 1 refills | Status: DC
  Filled 2021-07-25 – 2021-08-17 (×2): qty 180, 90d supply, fill #0
  Filled 2021-11-29: qty 180, 90d supply, fill #1

## 2021-07-25 MED ORDER — ROSUVASTATIN CALCIUM 5 MG PO TABS: 5.0000 mg | ORAL_TABLET | Freq: Every day | ORAL | 3 refills | Status: DC

## 2021-07-25 MED ORDER — METFORMIN HCL 1000 MG PO TABS
1000.0000 mg | ORAL_TABLET | Freq: Two times a day (BID) | ORAL | 1 refills | Status: DC
Start: 2021-07-25 — End: 2022-02-06
  Filled 2021-07-25 – 2021-08-20 (×2): qty 180, 90d supply, fill #0
  Filled 2021-11-29: qty 180, 90d supply, fill #1

## 2021-07-25 NOTE — Patient Instructions (Addendum)
-   Continuar Metformina 1 comprimido dos veces al da - Aumentar Glipizide 10 mg 1 comprimido antes del desayuno y 1 comprimido antes de la Cena - Empezar Farxiga 5 mg, 1 comprimido cada maana        - Continue Metformin 1 tablet twice a day  - Increase Glipizide 10 mg 1 tablet before breakfast and 1 tablet before Supper  - Start Farxiga 5 mg, 1 tablet every morning    ? CMO TRATAR LOS AZCARES BAJOS EN LA SANGRE (azcar en la sangre MENOS DE 70 MG/DL) ? Siga la REGLA DE 15 para el tratamiento de la hipoglucemia (cuando su (azcar en la sangre sea inferior a 70 mg/dL)  ? PASO 1: Tome 15 gramos de carbohidratos cuando su nivel de azcar en la sangre sea bajo, lo que incluye: ? 3-4 TABLETAS DE GLUCOSA O ? 3-4 ONZAS DE JUGO O SODA REGULAR O ? UN TUBO DE GEL DE GLUCOSA  ? PASO 2: VUELVA A COMPROBAR el azcar en la sangre en 15 MINUTOS PASO 3: Si su nivel de azcar en la sangre sigue bajo despus de los 15 minutos, vuelva a controlar --> entonces, regrese al PASO 1 y trate OTRA VEZ con otros 15 gramos de carbohidratos.    HOW TO TREAT LOW BLOOD SUGARS (Blood sugar LESS THAN 70 MG/DL) Please follow the RULE OF 15 for the treatment of hypoglycemia treatment (when your (blood sugars are less than 70 mg/dL)   STEP 1: Take 15 grams of carbohydrates when your blood sugar is low, which includes:  3-4 GLUCOSE TABS  OR 3-4 OZ OF JUICE OR REGULAR SODA OR ONE TUBE OF GLUCOSE GEL    STEP 2: RECHECK blood sugar in 15 MINUTES STEP 3: If your blood sugar is still low at the 15 minute recheck --> then, go back to STEP 1 and treat AGAIN with another 15 grams of carbohydrates.

## 2021-08-06 MED FILL — Levothyroxine Sodium Tab 25 MCG: ORAL | 30 days supply | Qty: 30 | Fill #5 | Status: AC

## 2021-08-07 ENCOUNTER — Other Ambulatory Visit (HOSPITAL_BASED_OUTPATIENT_CLINIC_OR_DEPARTMENT_OTHER): Payer: Self-pay

## 2021-08-17 ENCOUNTER — Other Ambulatory Visit (HOSPITAL_BASED_OUTPATIENT_CLINIC_OR_DEPARTMENT_OTHER): Payer: Self-pay

## 2021-08-21 ENCOUNTER — Other Ambulatory Visit (HOSPITAL_BASED_OUTPATIENT_CLINIC_OR_DEPARTMENT_OTHER): Payer: Self-pay

## 2021-08-22 ENCOUNTER — Other Ambulatory Visit (HOSPITAL_BASED_OUTPATIENT_CLINIC_OR_DEPARTMENT_OTHER): Payer: Self-pay | Admitting: Obstetrics & Gynecology

## 2021-08-22 DIAGNOSIS — Z1231 Encounter for screening mammogram for malignant neoplasm of breast: Secondary | ICD-10-CM

## 2021-09-03 MED FILL — Levothyroxine Sodium Tab 25 MCG: ORAL | 30 days supply | Qty: 30 | Fill #6 | Status: AC

## 2021-09-04 ENCOUNTER — Other Ambulatory Visit (HOSPITAL_BASED_OUTPATIENT_CLINIC_OR_DEPARTMENT_OTHER): Payer: Self-pay

## 2021-09-18 ENCOUNTER — Ambulatory Visit: Admitting: Obstetrics & Gynecology

## 2021-10-02 ENCOUNTER — Ambulatory Visit (HOSPITAL_BASED_OUTPATIENT_CLINIC_OR_DEPARTMENT_OTHER)

## 2021-10-04 ENCOUNTER — Other Ambulatory Visit (HOSPITAL_BASED_OUTPATIENT_CLINIC_OR_DEPARTMENT_OTHER): Payer: Self-pay

## 2021-10-04 MED FILL — Levothyroxine Sodium Tab 25 MCG: ORAL | 30 days supply | Qty: 30 | Fill #7 | Status: AC

## 2021-10-27 ENCOUNTER — Ambulatory Visit (INDEPENDENT_AMBULATORY_CARE_PROVIDER_SITE_OTHER): Admitting: Obstetrics & Gynecology

## 2021-10-27 ENCOUNTER — Other Ambulatory Visit: Payer: Self-pay

## 2021-10-27 ENCOUNTER — Encounter: Payer: Self-pay | Admitting: Obstetrics & Gynecology

## 2021-10-27 VITALS — BP 120/76 | HR 76 | Ht 63.75 in | Wt 208.0 lb

## 2021-10-27 DIAGNOSIS — Z01419 Encounter for gynecological examination (general) (routine) without abnormal findings: Secondary | ICD-10-CM | POA: Diagnosis not present

## 2021-10-27 DIAGNOSIS — Z6835 Body mass index (BMI) 35.0-35.9, adult: Secondary | ICD-10-CM

## 2021-10-27 DIAGNOSIS — Z780 Asymptomatic menopausal state: Secondary | ICD-10-CM

## 2021-10-27 NOTE — Progress Notes (Signed)
Deborah Henderson 1966-04-21 [id number]   History:    56 y.o. G3P2A1L2 Married   RP:  Established patient presenting for annual gyn exam    HPI: Robotic Left SO, Lysis of Omental Adhesions on 05/21/2017 with Dr Alycia Rossetti.  Patho:  Benign mucinous Cystadenoma.  No complication.  Menopause x 2016, well with mild occasional night sweats, declines HRT.  No PMB.  No pelvic pain. C/O dryness and pain with IC, improved with coconut oil. Pap 08/2020 Neg. Urine/BMs wnl.  Breasts wnl.  Screening mammo scheduled for next week. BMI 35.98 stable x last year.  Not very physically active.  Decreased calories/carbs in her diet to loose weight.  Health Labs with Fam MD. Harriet Masson 12/2018.   Past medical history,surgical history, family history and social history were all reviewed and documented in the EPIC chart.  Gynecologic History No LMP recorded. Patient is postmenopausal.  Obstetric History OB History  Gravida Para Term Preterm AB Living  3 2     1 2   SAB IAB Ectopic Multiple Live Births      1        # Outcome Date GA Lbr Len/2nd Weight Sex Delivery Anes PTL Lv  3 Ectopic           2 Para           1 Para              ROS: A ROS was performed and pertinent positives and negatives are included in the history.  GENERAL: No fevers or chills. HEENT: No change in vision, no earache, sore throat or sinus congestion. NECK: No pain or stiffness. CARDIOVASCULAR: No chest pain or pressure. No palpitations. PULMONARY: No shortness of breath, cough or wheeze. GASTROINTESTINAL: No abdominal pain, nausea, vomiting or diarrhea, melena or bright red blood per rectum. GENITOURINARY: No urinary frequency, urgency, hesitancy or dysuria. MUSCULOSKELETAL: No joint or muscle pain, no back pain, no recent trauma. DERMATOLOGIC: No rash, no itching, no lesions. ENDOCRINE: No polyuria, polydipsia, no heat or cold intolerance. No recent change in weight. HEMATOLOGICAL: No anemia or easy bruising or bleeding. NEUROLOGIC: No  headache, seizures, numbness, tingling or weakness. PSYCHIATRIC: No depression, no loss of interest in normal activity or change in sleep pattern.     Exam:   BP 120/76    Pulse 76    Ht 5' 3.75" (1.619 m)    Wt 208 lb (94.3 kg)    BMI 35.98 kg/m   Body mass index is 35.98 kg/m.  General appearance : Well developed well nourished female. No acute distress HEENT: Eyes: no retinal hemorrhage or exudates,  Neck supple, trachea midline, no carotid bruits, no thyroidmegaly Lungs: Clear to auscultation, no rhonchi or wheezes, or rib retractions  Heart: Regular rate and rhythm, no murmurs or gallops Breast:Examined in sitting and supine position were symmetrical in appearance, no palpable masses or tenderness,  no skin retraction, no nipple inversion, no nipple discharge, no skin discoloration, no axillary or supraclavicular lymphadenopathy Abdomen: no palpable masses or tenderness, no rebound or guarding Extremities: no edema or skin discoloration or tenderness  Pelvic: Vulva: Normal             Vagina: No gross lesions or discharge  Cervix: No gross lesions or discharge  Uterus  AV, normal size, shape and consistency, non-tender and mobile  Adnexa  Without masses or tenderness  Anus: Normal   Assessment/Plan:  56 y.o. female for annual exam   1. Well female exam  with routine gynecological exam Robotic Left SO, Lysis of Omental Adhesions on 05/21/2017 with Dr Alycia Rossetti.  Patho:  Benign mucinous Cystadenoma.  No complication.  Menopause x 2016, well with mild occasional night sweats, declines HRT.  No PMB.  No pelvic pain. C/O dryness and pain with IC, improved with coconut oil. Pap 08/2020 Neg. Urine/BMs wnl.  Breasts wnl.  Screening mammo scheduled for next week. BMI 35.98 stable x last year.  Not very physically active.  Decreased calories/carbs in her diet to loose weight.  Health Labs with Fam MD. Harriet Masson 12/2018.  2. Postmenopause Menopause x 2016, well with mild occasional night sweats,  declines HRT.  No PMB.  No pelvic pain. C/O dryness and pain with IC, improved with coconut oil.   3. Class 2 severe obesity due to excess calories with serious comorbidity and body mass index (BMI) of 35.0 to 35.9 in adult [facility])  Recommend a lower calorie/carb diet.  Increase fitness activities.  Princess Bruins MD, 10:21 AM 10/27/2021

## 2021-10-29 MED FILL — Levothyroxine Sodium Tab 25 MCG: ORAL | 30 days supply | Qty: 30 | Fill #8 | Status: AC

## 2021-10-30 ENCOUNTER — Other Ambulatory Visit (HOSPITAL_BASED_OUTPATIENT_CLINIC_OR_DEPARTMENT_OTHER): Payer: Self-pay

## 2021-10-31 ENCOUNTER — Encounter (HOSPITAL_BASED_OUTPATIENT_CLINIC_OR_DEPARTMENT_OTHER): Payer: Self-pay

## 2021-10-31 ENCOUNTER — Other Ambulatory Visit: Payer: Self-pay

## 2021-10-31 ENCOUNTER — Ambulatory Visit (HOSPITAL_BASED_OUTPATIENT_CLINIC_OR_DEPARTMENT_OTHER)
Admission: RE | Admit: 2021-10-31 | Discharge: 2021-10-31 | Disposition: A | Discharge status: Home / Self Care | Source: Ambulatory Visit | Attending: Obstetrics & Gynecology | Admitting: Obstetrics & Gynecology

## 2021-10-31 DIAGNOSIS — Z1231 Encounter for screening mammogram for malignant neoplasm of breast: Secondary | ICD-10-CM | POA: Diagnosis not present

## 2021-11-28 ENCOUNTER — Ambulatory Visit: Admitting: Internal Medicine

## 2021-11-29 ENCOUNTER — Other Ambulatory Visit: Payer: Self-pay | Admitting: Medical

## 2021-11-29 LAB — HM DIABETES EYE EXAM

## 2021-11-30 ENCOUNTER — Other Ambulatory Visit (HOSPITAL_BASED_OUTPATIENT_CLINIC_OR_DEPARTMENT_OTHER): Payer: Self-pay

## 2021-11-30 ENCOUNTER — Encounter: Payer: Self-pay | Admitting: Internal Medicine

## 2021-11-30 MED ORDER — LEVOTHYROXINE SODIUM 25 MCG PO TABS
ORAL_TABLET | Freq: Every day | ORAL | 11 refills | Status: DC
  Filled 2021-11-30: qty 30, 30d supply, fill #0
  Filled 2021-12-30: qty 30, 30d supply, fill #1
  Filled 2022-01-30: qty 30, 30d supply, fill #2
  Filled 2022-02-27: qty 30, 30d supply, fill #3
  Filled 2022-03-28 – 2022-04-02 (×2): qty 30, 30d supply, fill #4

## 2021-12-01 ENCOUNTER — Other Ambulatory Visit (HOSPITAL_BASED_OUTPATIENT_CLINIC_OR_DEPARTMENT_OTHER): Payer: Self-pay

## 2021-12-13 ENCOUNTER — Other Ambulatory Visit (HOSPITAL_BASED_OUTPATIENT_CLINIC_OR_DEPARTMENT_OTHER): Payer: Self-pay

## 2021-12-15 ENCOUNTER — Other Ambulatory Visit (HOSPITAL_BASED_OUTPATIENT_CLINIC_OR_DEPARTMENT_OTHER): Payer: Self-pay

## 2022-01-01 ENCOUNTER — Other Ambulatory Visit (HOSPITAL_BASED_OUTPATIENT_CLINIC_OR_DEPARTMENT_OTHER): Payer: Self-pay

## 2022-01-09 ENCOUNTER — Ambulatory Visit: Admitting: Internal Medicine

## 2022-01-09 NOTE — Progress Notes (Deleted)
?Name: Deborah Henderson  ?Age/ Sex: 56 y.o., female   ?MRN/ DOB: [id number], 1966-05-06    ? ?PCP: Mackie Pai, PA-C   ?Reason for Endocrinology Evaluation: Type 2 Diabetes Mellitus  ?Initial Endocrine Consultative Visit: 07/19/2020  ? ? ?PATIENT IDENTIFIER: Deborah Henderson is a 56 y.o. female with a past medical history of T2Dm, HTN and dyslipidemia. The patient has followed with Endocrinology clinic since 07/19/2020 for consultative assistance with management of her diabetes. ? ?DIABETIC HISTORY:  ?Deborah Henderson was diagnosed with DM in 2016 . Her hemoglobin A1c has ranged from 6.5% in 2018, peaking at 11.7% in 2020. ? ? ?On her initial visit to our clinic she had an A1c of  ?SUBJECTIVE:  ? ?During the last visit (07/19/2020): A1c 10.6 % continue metformin, increase glipizide and started [place] ? ?Today (01/09/2022): Deborah Henderson is here for a follow up on diabetes management.   She have been traveling.  ? ? ?She checks her blood sugars 0 times daily. The patient has unknown  hypoglycemic episodes since the last clinic visit. ? ? ?Has Polyuria due to diuretics ,  ?Denies polydipsia  ?Denies N/V  ? ?She had intermittent intake of Glipizide , was out of a month  ? ?HOME DIABETES REGIMEN:  ?Metformin 1000 mg BID  ?Glipizide 10 mg twice daily ?Farxiga 5 mg daily ? ? ? ? ?Statin: yes  ?ACE-I/ARB: yes ? ? ? ?METER DOWNLOAD SUMMARY: does not check  ? ? ? ?DIABETIC COMPLICATIONS: ?Microvascular complications:  ? ?Denies: CKD, retinopathy, CKD ?Last Eye Exam: Completed 07/2020 ? ?Macrovascular complications:  ?Denies: CAD, CVA, PVD ? ? ?HISTORY:  ?Past Medical History:  ?Past Medical History:  ?Diagnosis Date  ? Diabetes mellitus ([place])   ? type 2  ? Diverticulosis   ? Hypertension   ? Hypothyroidism   ? Obesity   ? Sleep apnea   ? Currently on CPAP machine  ? ?Past Surgical History:  ?Past Surgical History:  ?Procedure Laterality Date  ? CESAREAN SECTION    ? x2  ? CHOLECYSTECTOMY    ?  CHOLECYSTECTOMY, LAPAROSCOPIC  06/02/2015  ? [place]  ? COLONOSCOPY  02/04/2015  ? due 2021 per patient   ? ECTOPIC PREGNANCY SURGERY    ? ESOPHAGOGASTRODUODENOSCOPY  2015  ? OVARIAN CYST SURGERY  1988  ? Left  ? ROBOTIC ASSISTED BILATERAL SALPINGO OOPHERECTOMY N/A 05/21/2017  ? Procedure: XI ROBOTIC ASSIST  LEFT SALPINGO OOPHORECTOMY, LYSIS OF ADHESIONS;  Surgeon: Nancy Marus, Deborah Henderson;  Location: [facility];  Service: Gynecology;  Laterality: N/A;  ? ?Social History:  reports that she quit smoking about 32 years ago. Her smoking use included cigarettes. She has never used smokeless tobacco. She reports that she does not currently use alcohol. She reports that she does not use drugs. ?Family History:  ?Family History  ?Problem Relation Age of Onset  ? Cancer Father   ?     colon  ? Diabetes Father   ? Prostate cancer Father   ? Cancer Brother   ?     bone   ? Diabetes Brother   ? Diabetes Brother   ? Esophageal cancer Neg Hx   ? ? ? ?HOME MEDICATIONS: ?Allergies as of 01/09/2022   ? ?   Reactions  ? Asa [aspirin] Anaphylaxis, Rash  ? ?  ? ?  ?Medication List  ?  ? ?  ? Accurate as of January 09, 2022  7:30 AM. If you have any questions, ask your nurse or doctor.  ?  ?  ? ?  ? ?  glipiZIDE 10 MG tablet ?Commonly known as: GLUCOTROL ?Take 1 tablet (10 mg total) by mouth 2 (two) times daily before a meal. ?  ?levothyroxine 25 MCG tablet ?Commonly known as: SYNTHROID ?Tome 1 tableta (25 mcg en total) por v?a oral diariamente antes de desayunar. ?(TAKE 1 TABLET (25 MCG TOTAL) BY MOUTH DAILY BEFORE BREAKFAST.) ?  ?losartan-hydrochlorothiazide 50-12.5 MG tablet ?Commonly known as: HYZAAR ?Tome 1 tableta por v?a oral diariamente. ?(TAKE 1 TABLET BY MOUTH DAILY.) ?  ?metFORMIN 1000 MG tablet ?Commonly known as: GLUCOPHAGE ?Take 1 tablet (1,000 mg total) by mouth 2 (two) times daily with a meal. ?  ?polyethylene glycol powder 17 GM/SCOOP powder ?Commonly known as: GLYCOLAX/MIRALAX ?Take 17 g by mouth 2 (two) times daily as  needed. ?What changed: when to take this ?  ?rosuvastatin 5 MG tablet ?Commonly known as: CRESTOR ?Take 1 tablet (5 mg total) by mouth daily. ?  ? ?  ? ? ? ?OBJECTIVE:  ? ?Vital Signs: There were no vitals taken for this visit.  ?Wt Readings from Last 3 Encounters:  ?10/27/21 208 lb (94.3 kg)  ?07/25/21 212 lb 6.4 oz (96.3 kg)  ?07/14/21 216 lb 6.4 oz (98.2 kg)  ? ? ? ?Exam: ?General: Pt appears well and is in NAD  ?Neck: General: Supple without adenopathy. ?Thyroid: Thyroid size normal.  No goiter or nodules appreciate.   ?Lungs: Clear with good BS bilat with no rales, rhonchi, or wheezes  ?Heart: RRR   ?Abdomen: Normoactive bowel sounds, soft, nontender, without masses or organomegaly palpable  ?Extremities: No pretibial edema.   ?Neuro: MS is good with appropriate affect, pt is alert and Ox3  ? ? ? ?DATA REVIEWED: ? ?Lab Results  ?Component Value Date  ? HGBA1C 10.6 (H) 07/14/2021  ? HGBA1C 10.2 (H) 06/23/2020  ? HGBA1C 7.9 (H) 10/29/2019  ? ?Lab Results  ?Component Value Date  ? MICROALBUR 0.7 07/14/2021  ? [place] 35 07/14/2021  ? CREATININE 0.70 07/14/2021  ? ?No results found for: MICRALBCREAT ? ? ?Lab Results  ?Component Value Date  ? CHOL 89 07/14/2021  ? HDL 28.90 (L) 07/14/2021  ? [place] [facility] 35 07/14/2021  ? TRIG 122.0 07/14/2021  ? CHOLHDL 3 07/14/2021  ?     ? ? ?ASSESSMENT / PLAN / RECOMMENDATIONS:  ? ?1) Type 2 Diabetes Mellitus, Poorly controlled, With out complications - Most recent A1c of 10.6 %. Goal A1c < 7.0 %.   ? ? ?- I have offered insulin due to persistent hyperglycemia , but she declines and would rather stay on Glipizide.  ?- We discussed adding SGLT-2 inhibitors, cautioned against genital infections  ? ?- Discussed risk of blindness, CKD, neuropathy and amputations  ? ? ? ?MEDICATIONS: ?Continue Metformin 1000 mg BID ?INcrease Glipizide 10 mg BID  ?Start Farxiga 5 mg daily  ? ?EDUCATION / INSTRUCTIONS: ?BG monitoring instructions: Patient is instructed to check her blood sugars 1 times a  day, fasting . ?Call [place] [facility] clinic if: BG persistently < 70  ?I reviewed the Rule of 15 for the treatment of hypoglycemia in detail with the patient. Literature supplied. ? ? ? ?2) Diabetic complications:  ?Eye: Does not have known diabetic retinopathy.  ?Neuro/ Feet: Does not have known diabetic peripheral neuropathy .  ?Renal: Patient does not have known baseline CKD. She   is  on an ACEI/ARB at present.  ? ? ?3) Dyslipidemia : ? ? ?- Lipid panel at goal , needs refill on Rosuvastatin  ?- LFT's trending down  ? ?  Medication  ?Rosuvastatin 5 mg daily  ? ? ? ? ?F/U in 4 months   ? ? ?Signed electronically by: ?Abby Nena Jordan, Deborah Henderson ? ?[facility]  ?[facility] ?[address] ?[place], [place] [address] ?Phone: [phone] ?FAX: [phone] ? ? ?CC: ?Mackie Pai, PA-C ?[address] ?[place] [address] ?Phone: [phone]  ?Fax: [phone] ? ?Return to Endocrinology clinic as below: ?Future Appointments  ?Date Time Provider [place]  ?01/09/2022  9:50 AM Deborah Henderson, Deborah Crazier, Deborah Henderson [facility] None  ?01/30/2022 10:00 AM [facility] PREVISIT RM 52 [facility]  ?02/13/2022 11:00 AM Cirigliano, Vito V, DO [facility]  ?10/31/2022 10:00 AM Princess Bruins, Deborah Henderson [facility] None  ?  ? ? ?

## 2022-01-30 ENCOUNTER — Encounter

## 2022-01-30 ENCOUNTER — Other Ambulatory Visit (HOSPITAL_BASED_OUTPATIENT_CLINIC_OR_DEPARTMENT_OTHER): Payer: Self-pay

## 2022-02-06 ENCOUNTER — Encounter: Payer: Self-pay | Admitting: Internal Medicine

## 2022-02-06 ENCOUNTER — Ambulatory Visit (INDEPENDENT_AMBULATORY_CARE_PROVIDER_SITE_OTHER): Admitting: Internal Medicine

## 2022-02-06 ENCOUNTER — Other Ambulatory Visit (HOSPITAL_BASED_OUTPATIENT_CLINIC_OR_DEPARTMENT_OTHER): Payer: Self-pay

## 2022-02-06 VITALS — BP 124/76 | HR 90 | Ht 63.75 in | Wt 210.0 lb

## 2022-02-06 DIAGNOSIS — E785 Hyperlipidemia, unspecified: Secondary | ICD-10-CM | POA: Diagnosis not present

## 2022-02-06 DIAGNOSIS — E042 Nontoxic multinodular goiter: Secondary | ICD-10-CM | POA: Diagnosis not present

## 2022-02-06 DIAGNOSIS — E1165 Type 2 diabetes mellitus with hyperglycemia: Secondary | ICD-10-CM | POA: Diagnosis not present

## 2022-02-06 DIAGNOSIS — R739 Hyperglycemia, unspecified: Secondary | ICD-10-CM

## 2022-02-06 LAB — POCT GLUCOSE (DEVICE FOR HOME USE): Glucose Fasting, POC: 133 mg/dL — AB (ref 70–99)

## 2022-02-06 LAB — POCT GLYCOSYLATED HEMOGLOBIN (HGB A1C): Hemoglobin A1C: 7.3 % — AB (ref 4.0–5.6)

## 2022-02-06 MED ORDER — ROSUVASTATIN CALCIUM 5 MG PO TABS
5.0000 mg | ORAL_TABLET | Freq: Every day | ORAL | 3 refills | Status: DC
Start: 2022-02-06 — End: 2022-07-23

## 2022-02-06 MED ORDER — METFORMIN HCL 1000 MG PO TABS
1000.0000 mg | ORAL_TABLET | Freq: Two times a day (BID) | ORAL | 3 refills | Status: DC
  Filled 2022-02-06 – 2022-02-27 (×2): qty 180, 90d supply, fill #0
  Filled 2022-05-23: qty 180, 90d supply, fill #1
  Filled 2022-08-26: qty 180, 90d supply, fill #2
  Filled 2022-11-25: qty 180, 90d supply, fill #3

## 2022-02-06 MED ORDER — GLIPIZIDE 10 MG PO TABS
10.0000 mg | ORAL_TABLET | Freq: Two times a day (BID) | ORAL | 3 refills | Status: DC
  Filled 2022-02-06 – 2022-02-27 (×2): qty 180, 90d supply, fill #0
  Filled 2022-05-23: qty 180, 90d supply, fill #1

## 2022-02-06 NOTE — Patient Instructions (Signed)
-   Keep up the Good Work ! ?- Continue Metformin 1000 mg twice a day  ?- Continue Glipizide 10 mg Twice a day  ? ? ? ? ?- HOW TO TREAT LOW BLOOD SUGARS (Blood sugar LESS THAN 70 MG/DL) ?Please follow the RULE OF 15 for the treatment of hypoglycemia treatment (when your (blood sugars are less than 70 mg/dL)  ? ?STEP 1: Take 15 grams of carbohydrates when your blood sugar is low, which includes:  ?3-4 GLUCOSE TABS  OR ?3-4 OZ OF JUICE OR REGULAR SODA OR ?ONE TUBE OF GLUCOSE GEL   ? ?STEP 2: RECHECK blood sugar in 15 MINUTES ?STEP 3: If your blood sugar is still low at the 15 minute recheck --> then, go back to STEP 1 and treat AGAIN with another 15 grams of carbohydrates. ? ?

## 2022-02-06 NOTE — Progress Notes (Signed)
?Name: Deborah Henderson  ?Age/ Sex: 57 y.o., female   ?MRN/ DOB: [id number], Dec 04, 1965    ? ?PCP: Mackie Pai, PA-C   ?Reason for Endocrinology Evaluation: Type 2 Diabetes Mellitus  ?Initial Endocrine Consultative Visit: 07/19/2020  ? ? ?PATIENT IDENTIFIER: [name] is a 56 y.o. female with a past medical history of T2Dm, HTN and dyslipidemia. The patient has followed with Endocrinology clinic since 07/19/2020 for consultative assistance with management of her diabetes. ? ?DIABETIC HISTORY:  ?Ms. Henderson was diagnosed with DM in 2016 . Her hemoglobin A1c has ranged from 6.5% in 2018, peaking at 11.7% in 2020. ? ? ?On her initial visit to our clinic she had an A1c of  ? ?[place] is cost prohibitive  ? ? ?THYROID HISTORY : ? ?She is S/P benign FNA of the right mid 1.3 cm nodule in 2016. Repeat Thyroid ultrasound in 2017 showed sub-centimeter nodule  ? ? ?SUBJECTIVE:  ? ?During the last visit (07/19/2020): A1c 10.6 % continue metformin, increase glipizide and started [place] ? ? ? ?Today (02/06/2022): [name] is here for a follow up on diabetes management.  ? ? ?She checks her blood sugars 2 times daily. The patient has not has hypoglycemic episodes since the last clinic visit. ? ?Denies nausea, diarrhea or vomiting  ?She denies local neck  ? ? ? ?HOME DIABETES REGIMEN:  ?Metformin 1000 mg BID  ?Glipizide 10 mg twice daily ? ? ? ? ? ?Statin: yes  ?ACE-I/ARB: yes ? ? ? ?METER DOWNLOAD SUMMARY:  ? ? ? ?DIABETIC COMPLICATIONS: ?Microvascular complications:  ? ?Denies: CKD, retinopathy, CKD ?Last Eye Exam: Completed 07/2020 ? ?Macrovascular complications:  ?Denies: CAD, CVA, PVD ? ? ?HISTORY:  ?Past Medical History:  ?Past Medical History:  ?Diagnosis Date  ? Diabetes mellitus ([place])   ? type 2  ? Diverticulosis   ? Hypertension   ? Hypothyroidism   ? Obesity   ? Sleep apnea   ? Currently on CPAP machine  ? ?Past Surgical History:  ?Past Surgical History:  ?Procedure Laterality Date  ?  CESAREAN SECTION    ? x2  ? CHOLECYSTECTOMY    ? CHOLECYSTECTOMY, LAPAROSCOPIC  06/02/2015  ? [place]  ? COLONOSCOPY  02/04/2015  ? due 2021 per patient   ? ECTOPIC PREGNANCY SURGERY    ? ESOPHAGOGASTRODUODENOSCOPY  2015  ? OVARIAN CYST SURGERY  1988  ? Left  ? ROBOTIC ASSISTED BILATERAL SALPINGO OOPHERECTOMY N/A 05/21/2017  ? Procedure: XI ROBOTIC ASSIST  LEFT SALPINGO OOPHORECTOMY, LYSIS OF ADHESIONS;  Surgeon: Nancy Marus, MD;  Location: [facility];  Service: Gynecology;  Laterality: N/A;  ? ?Social History:  reports that she quit smoking about 32 years ago. Her smoking use included cigarettes. She has never used smokeless tobacco. She reports that she does not currently use alcohol. She reports that she does not use drugs. ?Family History:  ?Family History  ?Problem Relation Age of Onset  ? Cancer Father   ?     colon  ? Diabetes Father   ? Prostate cancer Father   ? Cancer Brother   ?     bone   ? Diabetes Brother   ? Diabetes Brother   ? Esophageal cancer Neg Hx   ? ? ? ?HOME MEDICATIONS: ?Allergies as of 02/06/2022   ? ?   Reactions  ? Asa [aspirin] Anaphylaxis, Rash  ? ?  ? ?  ?Medication List  ?  ? ?  ? Accurate as of February 06, 2022  9:55  AM. If you have any questions, ask your nurse or doctor.  ?  ?  ? ?  ? ?glipiZIDE 10 MG tablet ?Commonly known as: GLUCOTROL ?Take 1 tablet (10 mg total) by mouth 2 (two) times daily before a meal. ?  ?levothyroxine 25 MCG tablet ?Commonly known as: SYNTHROID ?Tome 1 tableta (25 mcg en total) por v?a oral diariamente antes de desayunar. ?(TAKE 1 TABLET (25 MCG TOTAL) BY MOUTH DAILY BEFORE BREAKFAST.) ?  ?losartan-hydrochlorothiazide 50-12.5 MG tablet ?Commonly known as: HYZAAR ?Tome 1 tableta por v?a oral diariamente. ?(TAKE 1 TABLET BY MOUTH DAILY.) ?  ?metFORMIN 1000 MG tablet ?Commonly known as: GLUCOPHAGE ?Take 1 tablet (1,000 mg total) by mouth 2 (two) times daily with a meal. ?  ?polyethylene glycol powder 17 GM/SCOOP powder ?Commonly known as: GLYCOLAX/MIRALAX ?Take  17 g by mouth 2 (two) times daily as needed. ?What changed: when to take this ?  ?rosuvastatin 5 MG tablet ?Commonly known as: CRESTOR ?Take 1 tablet (5 mg total) by mouth daily. ?  ? ?  ? ? ? ?OBJECTIVE:  ? ?Vital Signs: BP 124/76 (BP Location: Left Arm, Patient Position: Sitting, Cuff Size: Small)   Pulse 90   Ht 5' 3.75" (1.619 m)   Wt 210 lb (95.3 kg)   SpO2 95%   BMI 36.33 kg/m?  ? ?Wt Readings from Last 3 Encounters:  ?10/27/21 208 lb (94.3 kg)  ?07/25/21 212 lb 6.4 oz (96.3 kg)  ?07/14/21 216 lb 6.4 oz (98.2 kg)  ? ? ? ?Exam: ?General: Pt appears well and is in NAD  ?Neck: General: Supple without adenopathy. ?Thyroid: Thyroid size normal.  No goiter or nodules appreciate.   ?Lungs: Clear with good BS bilat with no rales, rhonchi, or wheezes  ?Heart: RRR   ?Abdomen: Normoactive bowel sounds, soft, nontender, without masses or organomegaly palpable  ?Extremities: No pretibial edema.   ?Neuro: MS is good with appropriate affect, pt is alert and Ox3  ? ? ? ?DATA REVIEWED: ? ?Lab Results  ?Component Value Date  ? HGBA1C 10.6 (H) 07/14/2021  ? HGBA1C 10.2 (H) 06/23/2020  ? HGBA1C 7.9 (H) 10/29/2019  ? ?Lab Results  ?Component Value Date  ? MICROALBUR 0.7 07/14/2021  ? [place] 35 07/14/2021  ? CREATININE 0.70 07/14/2021  ? ?No results found for: MICRALBCREAT ? ? ?Lab Results  ?Component Value Date  ? CHOL 89 07/14/2021  ? HDL 28.90 (L) 07/14/2021  ? [place] 35 07/14/2021  ? TRIG 122.0 07/14/2021  ? CHOLHDL 3 07/14/2021  ?     ? ? ?ASSESSMENT / PLAN / RECOMMENDATIONS:  ? ?1) Type 2 Diabetes Mellitus, Optimally controlled, With out complications - Most recent A1c of 7.3 %. Goal A1c < 7.0 %.   ? ?- A1c down from 10.6%  ?- Praised pt on improved glycemic control  ?-We have attempted to prescribe SGLT2 inhibitors but they were cost prohibitive ?-No changes today ? ?MEDICATIONS: ?Continue Metformin 1000 mg BID ?Continue glipizide 10 mg BID  ? ? ?EDUCATION / INSTRUCTIONS: ?BG monitoring instructions: Patient is  instructed to check her blood sugars 1 times a day, fasting . ?Call [facility] if: BG persistently < 70  ?I reviewed the Rule of 15 for the treatment of hypoglycemia in detail with the patient. Literature supplied. ? ? ? ?2) Diabetic complications:  ?Eye: Does not have known diabetic retinopathy.  ?Neuro/ Feet: Does not have known diabetic peripheral neuropathy .  ?Renal: Patient does not have known baseline CKD. She  is  on an ACEI/ARB at present.  ? ? ?3) Dyslipidemia : ? ? ?- Lipid panel at goal , needs refill on Rosuvastatin  ? ? ?Medication  ?Rosuvastatin 5 mg daily  ? ? ?4) MNG: ? ?No local neck symptoms  ?She is S/P benign FNA of the right nodule in 2016 ?Will proceed with thyroid ultrasound  ? ? ? ?F/U in 4 months   ? ? ?Signed electronically by: ?Abby Nena Jordan, MD ? ?[facility]  ?[facility] ?[address] ?[place], [place] [address] ?Phone: [phone] ?FAX: [phone] ? ? ?CC: ?Mackie Pai, PA-C ?[address] ?[place] [address] ?Phone: [phone]  ?Fax: [phone] ? ?Return to Endocrinology clinic as below: ?Future Appointments  ?Date Time Provider [place]  ?02/06/2022  1:40 PM Deborah Henderson, Melanie Crazier, MD [facility] None  ?03/14/2022 10:00 AM [facility] PREVISIT RM 52 [facility]  ?03/28/2022  8:30 AM Cirigliano, Vito V, DO [facility]  ?10/31/2022 10:00 AM Princess Bruins, MD [facility] None  ?  ? ? ?

## 2022-02-07 DIAGNOSIS — E042 Nontoxic multinodular goiter: Secondary | ICD-10-CM | POA: Insufficient documentation

## 2022-02-08 ENCOUNTER — Ambulatory Visit (HOSPITAL_BASED_OUTPATIENT_CLINIC_OR_DEPARTMENT_OTHER)
Admission: RE | Admit: 2022-02-08 | Discharge: 2022-02-08 | Disposition: A | Discharge status: Home / Self Care | Source: Ambulatory Visit | Attending: Internal Medicine | Admitting: Internal Medicine

## 2022-02-08 DIAGNOSIS — E042 Nontoxic multinodular goiter: Secondary | ICD-10-CM | POA: Diagnosis present

## 2022-02-13 ENCOUNTER — Encounter: Admitting: Gastroenterology

## 2022-02-27 ENCOUNTER — Other Ambulatory Visit (HOSPITAL_BASED_OUTPATIENT_CLINIC_OR_DEPARTMENT_OTHER): Payer: Self-pay

## 2022-03-14 ENCOUNTER — Encounter

## 2022-03-15 ENCOUNTER — Ambulatory Visit (AMBULATORY_SURGERY_CENTER): Admitting: *Deleted

## 2022-03-15 VITALS — Ht 64.0 in | Wt 210.0 lb

## 2022-03-15 DIAGNOSIS — Z86010 Personal history of colonic polyps: Secondary | ICD-10-CM

## 2022-03-15 MED ORDER — NA SULFATE-K SULFATE-MG SULF 17.5-3.13-1.6 GM/177ML PO SOLN: 2.0000 | Freq: Once | ORAL | 0 refills | Status: AC

## 2022-03-15 NOTE — Progress Notes (Signed)
No egg or soy allergy known to patient  No issues known to pt with past sedation with any surgeries or procedures Patient denies ever being told they had issues or difficulty with intubation  No FH of Malignant Hyperthermia Pt is not on diet pills Pt is not on  home 02  Pt is not on blood thinners  Pt takes Miralax as needed, instructed to take Miralax daily for 5-7 days prior to procedure No A fib or A flutter  PV completed over the phone with interprepter. Pt verified name, DOB, address and insurance during PV today.   Pt mailed instruction packet with copy of consent form to read and not return, and instructions.  Pt encouraged to call with questions or issues.   Insurance confirmed with pt at [facility] today

## 2022-03-25 ENCOUNTER — Encounter: Payer: Self-pay | Admitting: Certified Registered Nurse Anesthetist

## 2022-03-28 ENCOUNTER — Encounter: Payer: Self-pay | Admitting: Gastroenterology

## 2022-03-28 ENCOUNTER — Ambulatory Visit (AMBULATORY_SURGERY_CENTER): Admitting: Gastroenterology

## 2022-03-28 VITALS — BP 104/60 | HR 56 | Temp 98.4°F | Resp 18 | Ht 63.75 in | Wt 210.0 lb

## 2022-03-28 DIAGNOSIS — K5730 Diverticulosis of large intestine without perforation or abscess without bleeding: Secondary | ICD-10-CM

## 2022-03-28 DIAGNOSIS — Z09 Encounter for follow-up examination after completed treatment for conditions other than malignant neoplasm: Secondary | ICD-10-CM

## 2022-03-28 DIAGNOSIS — K640 First degree hemorrhoids: Secondary | ICD-10-CM

## 2022-03-28 DIAGNOSIS — K621 Rectal polyp: Secondary | ICD-10-CM

## 2022-03-28 DIAGNOSIS — Z86010 Personal history of colonic polyps: Secondary | ICD-10-CM

## 2022-03-28 DIAGNOSIS — D128 Benign neoplasm of rectum: Secondary | ICD-10-CM

## 2022-03-28 MED ORDER — SODIUM CHLORIDE 0.9 % IV SOLN: 500.0000 mL | Freq: Once | INTRAVENOUS | Status: DC

## 2022-03-28 NOTE — Progress Notes (Signed)
GASTROENTEROLOGY PROCEDURE H&P NOTE   Primary Care Physician: Mackie Pai, PA-C    Reason for Procedure:  Colon Polyp surveillance  Plan:    Colonoscopy  Patient is appropriate for endoscopic procedure(s) in the ambulatory ([place]) setting.  The nature of the procedure, as well as the risks, benefits, and alternatives were carefully and thoroughly reviewed with the patient. Ample time for discussion and questions allowed. The patient understood, was satisfied, and agreed to proceed.     HPI: Deborah Henderson is a 56 y.o. female who presents for colonoscopy for ongoing polyp surveillance. No active GI symptoms.    Endoscopic Hx: - Colonoscopy (12/25/2018, Dr Bryan Lemma): SSP x5 (4-11 mm), diverticulosis, internal hemorrhoids. Recommended repeat in 3 years.  -Colonoscopy (01/2015, [place]): External hemorrhoids, pandiverticulosis, mild proctitis (bxs benign colonic mucosa), internal hemorrhoids.  Suboptimal bowel prep.   Past Medical History:  Diagnosis Date   Diabetes mellitus ([place])    type 2   Diverticulosis    Hyperlipidemia    Hypertension    Hypothyroidism    Obesity    Sleep apnea    Currently on CPAP machine    Past Surgical History:  Procedure Laterality Date   CESAREAN SECTION     x2   CHOLECYSTECTOMY     CHOLECYSTECTOMY, LAPAROSCOPIC  06/02/2015   [place]   COLONOSCOPY  02/04/2015   due 2021 per patient    ECTOPIC PREGNANCY SURGERY     ESOPHAGOGASTRODUODENOSCOPY  2015   OVARIAN CYST SURGERY  1988   Left   ROBOTIC ASSISTED BILATERAL SALPINGO OOPHERECTOMY N/A 05/21/2017   Procedure: XI ROBOTIC ASSIST  LEFT SALPINGO OOPHORECTOMY, LYSIS OF ADHESIONS;  Surgeon: Nancy Marus, MD;  Location: [facility];  Service: Gynecology;  Laterality: N/A;    Prior to Admission medications   Medication Sig Start Date End Date Taking? Authorizing Provider  glipiZIDE (GLUCOTROL) 10 MG tablet Take 1 tablet (10 mg total) by mouth 2 (two) times daily  before a meal. 02/06/22  Yes Shamleffer, Melanie Crazier, MD  levothyroxine (SYNTHROID) 25 MCG tablet TAKE 1 TABLET (25 MCG TOTAL) BY MOUTH DAILY BEFORE BREAKFAST. 11/30/21 11/30/22 Yes Saguier, Percell Miller, PA-C  losartan-hydrochlorothiazide (HYZAAR) 50-12.5 MG tablet TAKE 1 TABLET BY MOUTH DAILY. 07/14/21 07/14/22 Yes Saguier, Percell Miller, PA-C  metFORMIN (GLUCOPHAGE) 1000 MG tablet Take 1 tablet (1,000 mg total) by mouth 2 (two) times daily with a meal. 02/06/22  Yes Shamleffer, Melanie Crazier, MD  polyethylene glycol powder (GLYCOLAX/MIRALAX) 17 GM/SCOOP powder Take 17 g by mouth 2 (two) times daily as needed. Patient taking differently: Take 17 g by mouth as needed. 10/29/19  Yes Saguier, Percell Miller, PA-C  rosuvastatin (CRESTOR) 5 MG tablet Take 1 tablet (5 mg total) by mouth daily. 02/06/22  Yes Shamleffer, Melanie Crazier, MD    Current Outpatient Medications  Medication Sig Dispense Refill   glipiZIDE (GLUCOTROL) 10 MG tablet Take 1 tablet (10 mg total) by mouth 2 (two) times daily before a meal. 180 tablet 3   levothyroxine (SYNTHROID) 25 MCG tablet TAKE 1 TABLET (25 MCG TOTAL) BY MOUTH DAILY BEFORE BREAKFAST. 30 tablet 11   losartan-hydrochlorothiazide (HYZAAR) 50-12.5 MG tablet TAKE 1 TABLET BY MOUTH DAILY. 90 tablet 3   metFORMIN (GLUCOPHAGE) 1000 MG tablet Take 1 tablet (1,000 mg total) by mouth 2 (two) times daily with a meal. 180 tablet 3   polyethylene glycol powder (GLYCOLAX/MIRALAX) 17 GM/SCOOP powder Take 17 g by mouth 2 (two) times daily as needed. (Patient taking differently: Take 17 g by mouth  as needed.) 3350 g 1   rosuvastatin (CRESTOR) 5 MG tablet Take 1 tablet (5 mg total) by mouth daily. 90 tablet 3   Current Facility-Administered Medications  Medication Dose Route Frequency Provider Last Rate Last Admin   0.9 %  sodium chloride infusion  500 mL Intravenous Once Alpa Salvo V, DO        Allergies as of 03/28/2022 - Review Complete 03/28/2022  Allergen Reaction Noted   Asa [aspirin]  Anaphylaxis and Rash 11/09/2016    Family History  Problem Relation Age of Onset   Colon cancer Father    Cancer Father        colon   Diabetes Father    Prostate cancer Father    Cancer Brother        bone    Diabetes Brother    Diabetes Brother    Esophageal cancer Neg Hx    Stomach cancer Neg Hx    Rectal cancer Neg Hx    Colon polyps Neg Hx     Social History   Socioeconomic History   Marital status: Married    Spouse name: Not on file   Number of children: 2   Years of education: Not on file   Highest education level: Not on file  Occupational History   Occupation: Housewife   Tobacco Use   Smoking status: Former    Types: Cigarettes    Quit date: 11/09/1989    Years since quitting: 32.4   Smokeless tobacco: Never   Tobacco comments:    Quit >20 years ago  Vaping Use   Vaping Use: Never used  Substance and Sexual Activity   Alcohol use: Not Currently   Drug use: No   Sexual activity: Yes    Partners: Male    Birth control/protection: Post-menopausal  Other Topics Concern   Not on file  Social History Narrative   Not on file   Social Determinants of Health   Financial Resource Strain: Not on file  Food Insecurity: Not on file  Transportation Needs: Not on file  Physical Activity: Not on file  Stress: Not on file  Social Connections: Not on file  Intimate Partner Violence: Not on file    Physical Exam: Vital signs in last 24 hours: '@BP'$  133/82   Pulse 79   Temp 98.4 F (36.9 C) (Temporal)   Ht 5' 3.75" (1.619 m)   Wt 210 lb (95.3 kg)   SpO2 97%   BMI 36.33 kg/m  GEN: NAD EYE: Sclerae anicteric ENT: MMM CV: Non-tachycardic Pulm: CTA b/l GI: Soft, NT/ND NEURO:  Alert & Oriented x 3   Gerrit Heck, DO [facility]   03/28/2022 8:35 AM

## 2022-03-28 NOTE — Progress Notes (Signed)
Pt's states no medical or surgical changes since previsit or office visit.  Interpreter used today at the [facility] for this pt.  Interpreter's name is- Verdis Frederickson -used interpreter to complete admission history. Pt stated that she would prefer for her husband to interpret for the rest of her appointment today. Waiver explained to pt by interpreter and pt verbalized understanding. Pt signed waiver and husband brought to bedside to interpret.

## 2022-03-28 NOTE — Patient Instructions (Addendum)
Handouts on polyps, hemorrhoids, and diverticulosis given to you today    YOU HAD AN ENDOSCOPIC PROCEDURE TODAY AT [facility]:   Refer to the procedure report that was given to you for any specific questions about what was found during the examination.  If the procedure report does not answer your questions, please call your gastroenterologist to clarify.  If you requested that your care partner not be given the details of your procedure findings, then the procedure report has been included in a sealed envelope for you to review at your convenience later.  YOU SHOULD EXPECT: Some feelings of bloating in the abdomen. Passage of more gas than usual.  Walking can help get rid of the air that was put into your GI tract during the procedure and reduce the bloating. If you had a lower endoscopy (such as a colonoscopy or flexible sigmoidoscopy) you may notice spotting of blood in your stool or on the toilet paper. If you underwent a bowel prep for your procedure, you may not have a normal bowel movement for a few days.  Please Note:  You might notice some irritation and congestion in your nose or some drainage.  This is from the oxygen used during your procedure.  There is no need for concern and it should clear up in a day or so.  SYMPTOMS TO REPORT IMMEDIATELY:  Following lower endoscopy (colonoscopy or flexible sigmoidoscopy):  Excessive amounts of blood in the stool  Significant tenderness or worsening of abdominal pains  Swelling of the abdomen that is new, acute  Fever of 100F or higher  For urgent or emergent issues, a gastroenterologist can be reached at any hour by calling [phone]. Do not use MyChart messaging for urgent concerns.    DIET:  We do recommend a small meal at first, but then you may proceed to your regular diet.  Drink plenty of fluids but you should avoid alcoholic beverages for 24 hours.  ACTIVITY:  You should plan to take it easy for the rest of  today and you should NOT DRIVE or use heavy machinery until tomorrow (because of the sedation medicines used during the test).    FOLLOW UP: Our staff will call the number listed on your records 24-72 hours following your procedure to check on you and address any questions or concerns that you may have regarding the information given to you following your procedure. If we do not reach you, we will leave a message.  We will attempt to reach you two times.  During this call, we will ask if you have developed any symptoms of COVID 19. If you develop any symptoms (ie: fever, flu-like symptoms, shortness of breath, cough etc.) before then, please call [phone].  If you test positive for Covid 19 in the 2 weeks post procedure, please call and report this information to [place].    If any biopsies were taken you will be contacted by phone or by letter within the next 1-3 weeks.  Please call us at [phone] if you have not heard about the biopsies in 3 weeks.    SIGNATURES/CONFIDENTIALITY: You and/or your care partner have signed paperwork which will be entered into your electronic medical record.  These signatures attest to the fact that that the information above on your After Visit Summary has been reviewed and is understood.  Full responsibility of the confidentiality of this discharge information lies with you and/or your care-partner.

## 2022-03-28 NOTE — Progress Notes (Signed)
Report given to PACU, vss 

## 2022-03-28 NOTE — Op Note (Signed)
[facility] Patient Name: Deborah Henderson Procedure Date: 03/28/2022 8:31 AM MRN: [id number] Endoscopist: Gerrit Heck , MD Age: 56 Referring MD:  Date of Birth: Oct 26, 1965 Gender: Female Account #: [id number] Procedure:                Colonoscopy Indications:              High risk colon cancer surveillance: Personal                            history of sessile serrated colon polyp (less than                            10 mm in size) with no dysplasia                           Last Colonoscopy was 12/25/2018 and notable for SSP                            x5 (4-11 mm), diverticulosis, internal hemorrhoids.                            Recommended repeat in 3 years.Has since completed                            hemorrhoid banding. Medicines:                Monitored Anesthesia Care Procedure:                Pre-Anesthesia Assessment:                           - Prior to the procedure, a History and Physical                            was performed, and patient medications and                            allergies were reviewed. The patient's tolerance of                            previous anesthesia was also reviewed. The risks                            and benefits of the procedure and the sedation                            options and risks were discussed with the patient.                            All questions were answered, and informed consent                            was obtained. Prior Anticoagulants: The patient has  taken no previous anticoagulant or antiplatelet                            agents. ASA Grade Assessment: II - A patient with                            mild systemic disease. After reviewing the risks                            and benefits, the patient was deemed in                            satisfactory condition to undergo the procedure.                           After obtaining informed consent, the colonoscope                             was passed under direct vision. Throughout the                            procedure, the patient's blood pressure, pulse, and                            oxygen saturations were monitored continuously. The                            Colonoscope was introduced through the anus and                            advanced to the the cecum, identified by the                            appendiceal orifice. The colonoscopy was performed                            without difficulty. The patient tolerated the                            procedure well. The quality of the bowel                            preparation was good. The terminal ileum, ileocecal                            valve, appendiceal orifice, and rectum were                            photographed. Scope In: 8:42:06 AM Scope Out: 9:04:06 AM Scope Withdrawal Time: 0 hours 11 minutes 47 seconds  Total Procedure Duration: 0 hours 22 minutes 0 seconds  Findings:                 The perianal and digital rectal examinations were  normal.                           A 5 mm polyp was found in the rectum. The polyp was                            sessile. The polyp was removed with a cold snare.                            Resection and retrieval were complete. Estimated                            blood loss was minimal.                           Multiple small and large-mouthed diverticula were                            found in the entire colon.                           A couple post hemorrhoid banding scars were found                            in the distal rectum.                           Non-bleeding internal hemorrhoids were found during                            retroflexion. The hemorrhoids were small.                           The ascending colon revealed significantly                            excessive looping. Advancing the scope required                            using manual  pressure. Complications:            No immediate complications. Estimated Blood Loss:     Estimated blood loss was minimal. Impression:               - One 5 mm polyp in the rectum, removed with a cold                            snare. Resected and retrieved.                           - Diverticulosis in the entire examined colon.                           - Post hemorrhoid banding scars in the distal  rectum.                           - Non-bleeding internal hemorrhoids.                           - There was significant looping of the colon. Recommendation:           - Patient has a contact number available for                            emergencies. The signs and symptoms of potential                            delayed complications were discussed with the                            patient. Return to normal activities tomorrow.                            Written discharge instructions were provided to the                            patient.                           - Resume previous diet.                           - Continue present medications.                           - Await pathology results.                           - Repeat colonoscopy for surveillance based on                            pathology results.                           - Return to GI clinic PRN. Gerrit Heck, MD 03/28/2022 9:10:06 AM

## 2022-03-28 NOTE — Progress Notes (Signed)
Called to room to assist during endoscopic procedure.  Patient ID and intended procedure confirmed with present staff. Received instructions for my participation in the procedure from the performing physician.  

## 2022-03-29 ENCOUNTER — Other Ambulatory Visit (HOSPITAL_BASED_OUTPATIENT_CLINIC_OR_DEPARTMENT_OTHER): Payer: Self-pay

## 2022-03-29 ENCOUNTER — Telehealth: Payer: Self-pay | Admitting: *Deleted

## 2022-03-29 ENCOUNTER — Telehealth: Payer: Self-pay

## 2022-03-29 NOTE — Telephone Encounter (Signed)
Second attempt follow up call to pt, no answer.  

## 2022-03-29 NOTE — Telephone Encounter (Signed)
Attempted f/u phone call. No answer. Left message.   [place], HW#[id number]

## 2022-04-02 ENCOUNTER — Other Ambulatory Visit (HOSPITAL_BASED_OUTPATIENT_CLINIC_OR_DEPARTMENT_OTHER): Payer: Self-pay

## 2022-04-03 ENCOUNTER — Other Ambulatory Visit (HOSPITAL_BASED_OUTPATIENT_CLINIC_OR_DEPARTMENT_OTHER): Payer: Self-pay

## 2022-04-03 ENCOUNTER — Other Ambulatory Visit: Payer: Self-pay

## 2022-04-03 MED ORDER — LEVOTHYROXINE SODIUM 25 MCG PO TABS
ORAL_TABLET | Freq: Every day | ORAL | 11 refills | Status: DC
  Filled 2022-04-03: qty 30, 30d supply, fill #0
  Filled 2022-04-30: qty 30, 30d supply, fill #1
  Filled 2022-05-28: qty 30, 30d supply, fill #2
  Filled 2022-07-02: qty 30, 30d supply, fill #3
  Filled 2022-07-31: qty 30, 30d supply, fill #4
  Filled 2022-08-26: qty 30, 30d supply, fill #5
  Filled 2022-09-30: qty 30, 30d supply, fill #6
  Filled 2022-10-30: qty 30, 30d supply, fill #7
  Filled 2022-11-25: qty 30, 30d supply, fill #8
  Filled 2022-12-30: qty 30, 30d supply, fill #9
  Filled 2023-01-29: qty 30, 30d supply, fill #10
  Filled 2023-02-20 – 2023-02-25 (×3): qty 30, 30d supply, fill #11

## 2022-04-05 ENCOUNTER — Other Ambulatory Visit (HOSPITAL_BASED_OUTPATIENT_CLINIC_OR_DEPARTMENT_OTHER): Payer: Self-pay

## 2022-04-13 ENCOUNTER — Other Ambulatory Visit (HOSPITAL_BASED_OUTPATIENT_CLINIC_OR_DEPARTMENT_OTHER): Payer: Self-pay

## 2022-04-30 ENCOUNTER — Other Ambulatory Visit (HOSPITAL_BASED_OUTPATIENT_CLINIC_OR_DEPARTMENT_OTHER): Payer: Self-pay

## 2022-05-23 ENCOUNTER — Other Ambulatory Visit (HOSPITAL_BASED_OUTPATIENT_CLINIC_OR_DEPARTMENT_OTHER): Payer: Self-pay

## 2022-05-28 ENCOUNTER — Other Ambulatory Visit (HOSPITAL_BASED_OUTPATIENT_CLINIC_OR_DEPARTMENT_OTHER): Payer: Self-pay

## 2022-07-02 ENCOUNTER — Other Ambulatory Visit (HOSPITAL_BASED_OUTPATIENT_CLINIC_OR_DEPARTMENT_OTHER): Payer: Self-pay

## 2022-07-02 ENCOUNTER — Other Ambulatory Visit: Payer: Self-pay | Admitting: Medical

## 2022-07-02 MED ORDER — LOSARTAN POTASSIUM-HCTZ 50-12.5 MG PO TABS
1.0000 | ORAL_TABLET | Freq: Every day | ORAL | 3 refills | Status: DC
  Filled 2022-07-02: qty 90, 90d supply, fill #0
  Filled 2022-09-30: qty 90, 90d supply, fill #1
  Filled 2022-12-30: qty 90, 90d supply, fill #2
  Filled 2023-03-27: qty 90, 90d supply, fill #3

## 2022-07-17 ENCOUNTER — Encounter: Admitting: Medical

## 2022-07-23 ENCOUNTER — Ambulatory Visit (INDEPENDENT_AMBULATORY_CARE_PROVIDER_SITE_OTHER): Admitting: Medical

## 2022-07-23 ENCOUNTER — Encounter: Payer: Self-pay | Admitting: Medical

## 2022-07-23 VITALS — BP 140/78 | HR 90 | Resp 18 | Ht 63.0 in | Wt 208.6 lb

## 2022-07-23 DIAGNOSIS — E785 Hyperlipidemia, unspecified: Secondary | ICD-10-CM

## 2022-07-23 DIAGNOSIS — Z0000 Encounter for general adult medical examination without abnormal findings: Secondary | ICD-10-CM

## 2022-07-23 DIAGNOSIS — E1165 Type 2 diabetes mellitus with hyperglycemia: Secondary | ICD-10-CM | POA: Diagnosis not present

## 2022-07-23 DIAGNOSIS — M25532 Pain in left wrist: Secondary | ICD-10-CM | POA: Diagnosis not present

## 2022-07-23 DIAGNOSIS — E039 Hypothyroidism, unspecified: Secondary | ICD-10-CM | POA: Diagnosis not present

## 2022-07-23 DIAGNOSIS — Z23 Encounter for immunization: Secondary | ICD-10-CM

## 2022-07-23 LAB — LIPID PANEL
Cholesterol: 78 mg/dL (ref 0–200)
HDL: 33.5 mg/dL — ABNORMAL LOW (ref >39.00)
LDL Cholesterol: 21 mg/dL (ref 0–99)
NonHDL: 44.47
Total CHOL/HDL Ratio: 2
Triglycerides: 117 mg/dL (ref 0.0–149.0)
VLDL: 23.4 mg/dL (ref 0.0–40.0)

## 2022-07-23 LAB — CBC WITH DIFFERENTIAL/PLATELET
Basophils Absolute: 0 10*3/uL (ref 0.0–0.1)
Basophils Relative: 0.6 % (ref 0.0–3.0)
Eosinophils Absolute: 0 10*3/uL (ref 0.0–0.7)
Eosinophils Relative: 0.6 % (ref 0.0–5.0)
HCT: 39.7 % (ref 36.0–46.0)
Hemoglobin: 13.4 g/dL (ref 12.0–15.0)
Lymphocytes Relative: 34.4 % (ref 12.0–46.0)
Lymphs Abs: 2.6 10*3/uL (ref 0.7–4.0)
MCHC: 33.7 g/dL (ref 30.0–36.0)
MCV: 88.9 fl (ref 78.0–100.0)
Monocytes Absolute: 0.5 10*3/uL (ref 0.1–1.0)
Monocytes Relative: 6.5 % (ref 3.0–12.0)
Neutro Abs: 4.4 10*3/uL (ref 1.4–7.7)
Neutrophils Relative %: 57.9 % (ref 43.0–77.0)
Platelets: 209 10*3/uL (ref 150.0–400.0)
RBC: 4.46 Mil/uL (ref 3.87–5.11)
RDW: 14.7 % (ref 11.5–15.5)
WBC: 7.7 10*3/uL (ref 4.0–10.5)

## 2022-07-23 LAB — COMPREHENSIVE METABOLIC PANEL
ALT: 72 U/L — ABNORMAL HIGH (ref 0–35)
AST: 65 U/L — ABNORMAL HIGH (ref 0–37)
Albumin: 4.6 g/dL (ref 3.5–5.2)
Alkaline Phosphatase: 72 U/L (ref 39–117)
BUN: 15 mg/dL (ref 6–23)
CO2: 24 mEq/L (ref 19–32)
Calcium: 10 mg/dL (ref 8.4–10.5)
Chloride: 103 mEq/L (ref 96–112)
Creatinine, Ser: 0.81 mg/dL (ref 0.40–1.20)
GFR: 81.63 mL/min (ref >60.00)
Glucose, Bld: 144 mg/dL — ABNORMAL HIGH (ref 70–99)
Potassium: 3.9 mEq/L (ref 3.5–5.1)
Sodium: 137 mEq/L (ref 135–145)
Total Bilirubin: 0.7 mg/dL (ref 0.2–1.2)
Total Protein: 7.2 g/dL (ref 6.0–8.3)

## 2022-07-23 LAB — TSH: TSH: 2.25 u[IU]/mL (ref 0.35–5.50)

## 2022-07-23 LAB — T4, FREE: Free T4: 0.64 ng/dL (ref 0.60–1.60)

## 2022-07-23 MED ORDER — GLIPIZIDE 10 MG PO TABS: 10.0000 mg | ORAL_TABLET | Freq: Two times a day (BID) | ORAL | 3 refills | Status: DC

## 2022-07-23 MED ORDER — ROSUVASTATIN CALCIUM 10 MG PO TABS: 10.0000 mg | ORAL_TABLET | Freq: Every day | ORAL | 3 refills | Status: DC

## 2022-07-23 NOTE — Progress Notes (Signed)
Subjective:    Patient ID: Deborah Henderson, female    DOB: Jan 05, 1966, 56 y.o.   MRN: [id number]  HPI  Pt in for cpe/wellness. Has been one one year since last seen so decided to go ahead and address chronic med problems as well.   Pt has exercising on treadmill over past week/just started. Pt states she has been eating healthy and watching portions since diabetic. No smoker. No alcohol.    Pt states will get flu vaccine today.   Pt got flu vaccine today.  Pt will get covid vaccine/most recent.   Pt up to date colonoscopy done 03-28-2022. Mammogram done 10-31-2021.   Pt has htn. She ran out of losartan one week ago. Bp level is high. Mild headache today but no gross motor or sensory deficits.   Hx of diabetes. About one year since last a1c was high. She is seeing Dr. Kelton Pillar.   Pt also has low thyroid. On supplementaion.  Pt is on crestor but is too expensive. She states can get lower price at [facility].    Review of Systems  Constitutional:  Negative for chills, fatigue and fever.  HENT:  Negative for congestion, drooling and ear pain.   Respiratory:  Negative for cough, chest tightness, shortness of breath and wheezing.   Cardiovascular:  Negative for chest pain and palpitations.  Gastrointestinal:  Negative for abdominal pain, constipation and diarrhea.  Genitourinary:  Negative for dyspareunia, enuresis and frequency.  Musculoskeletal:  Negative for back pain and joint swelling.       Left wrist  pain with small lump for one month.  Skin:  Negative for rash and wound.  Neurological:  Negative for facial asymmetry, light-headedness and numbness.  Hematological:  Negative for adenopathy. Does not bruise/bleed easily.  Psychiatric/Behavioral:  Negative for behavioral problems and hallucinations.     Past Medical History:  Diagnosis Date   Diabetes mellitus ([place])    type 2   Diverticulosis    Hyperlipidemia    Hypertension    Hypothyroidism    Obesity     Sleep apnea    Currently on CPAP machine     Social History   Socioeconomic History   Marital status: Married    Spouse name: Not on file   Number of children: 2   Years of education: Not on file   Highest education level: Not on file  Occupational History   Occupation: Housewife   Tobacco Use   Smoking status: Former    Types: Cigarettes    Quit date: 11/09/1989    Years since quitting: 32.7   Smokeless tobacco: Never   Tobacco comments:    Quit >20 years ago  Vaping Use   Vaping Use: Never used  Substance and Sexual Activity   Alcohol use: Not Currently   Drug use: No   Sexual activity: Yes    Partners: Male    Birth control/protection: Post-menopausal  Other Topics Concern   Not on file  Social History Narrative   Not on file   Social Determinants of Health   Financial Resource Strain: Not on file  Food Insecurity: Not on file  Transportation Needs: Not on file  Physical Activity: Not on file  Stress: Not on file  Social Connections: Not on file  Intimate Partner Violence: Not on file    Past Surgical History:  Procedure Laterality Date   CESAREAN SECTION     x2   CHOLECYSTECTOMY     CHOLECYSTECTOMY, LAPAROSCOPIC  06/02/2015  [place]   COLONOSCOPY  02/04/2015   due 2021 per patient    ECTOPIC PREGNANCY SURGERY     ESOPHAGOGASTRODUODENOSCOPY  2015   OVARIAN CYST SURGERY  1988   Left   ROBOTIC ASSISTED BILATERAL SALPINGO OOPHERECTOMY N/A 05/21/2017   Procedure: XI ROBOTIC ASSIST  LEFT SALPINGO OOPHORECTOMY, LYSIS OF ADHESIONS;  Surgeon: Nancy Marus, MD;  Location: [facility];  Service: Gynecology;  Laterality: N/A;    Family History  Problem Relation Age of Onset   Colon cancer Father    Cancer Father        colon   Diabetes Father    Prostate cancer Father    Cancer Brother        bone    Diabetes Brother    Diabetes Brother    Esophageal cancer Neg Hx    Stomach cancer Neg Hx    Rectal cancer Neg Hx    Colon polyps Neg Hx      Allergies  Allergen Reactions   Asa [Aspirin] Anaphylaxis and Rash    Current Outpatient Medications on File Prior to Visit  Medication Sig Dispense Refill   glipiZIDE (GLUCOTROL) 10 MG tablet Take 1 tablet (10 mg total) by mouth 2 (two) times daily before a meal. 180 tablet 3   levothyroxine (SYNTHROID) 25 MCG tablet TAKE 1 TABLET (25 MCG TOTAL) BY MOUTH DAILY BEFORE BREAKFAST. 30 tablet 11   losartan-hydrochlorothiazide (HYZAAR) 50-12.5 MG tablet TAKE 1 TABLET BY MOUTH DAILY. 90 tablet 3   metFORMIN (GLUCOPHAGE) 1000 MG tablet Take 1 tablet (1,000 mg total) by mouth 2 (two) times daily with a meal. 180 tablet 3   polyethylene glycol powder (GLYCOLAX/MIRALAX) 17 GM/SCOOP powder Take 17 g by mouth 2 (two) times daily as needed. (Patient taking differently: Take 17 g by mouth as needed.) 3350 g 1   No current facility-administered medications on file prior to visit.    BP (!) 140/78   Pulse 90   Resp 18   Ht '5\' 3"'$  (1.6 m)   Wt 208 lb 9.6 oz (94.6 kg)   SpO2 97%   BMI 36.95 kg/m         Objective:   Physical Exam  General Mental Status- Alert. General Appearance- Not in acute distress.   Skin General: Color- Normal Color. Moisture- Normal Moisture.  Neck Carotid Arteries- Normal color. Moisture- Normal Moisture. No carotid bruits. No JVD.  Chest and Lung Exam Auscultation: Breath Sounds:-Normal.  Cardiovascular Auscultation:Rythm- Regular. Murmurs & Other Heart Sounds:Auscultation of the heart reveals- No Murmurs.  Abdomen Inspection:-Inspeection Normal. Palpation/Percussion:Note:No mass. Palpation and Percussion of the abdomen reveal- Non Tender, Non Distended + BS, no rebound or guarding.   Neurologic Cranial Nerve exam:- CN III-XII intact(No nystagmus), symmetric smile. Strength:- 5/5 equal and symmetric strength both upper and lower extremities.   Left wrist- on palpation feels like small ganglion cyst.      Assessment & Plan:   For you wellness  exam today I have ordered cbc, cmp and  lipid panel.   Vaccine given today flu vaccine  Recommend exercise and healthy diet.   We will let you know lab results as they come in.   Follow up date appointment will be determined after lab review.    Htn- bp borderline today but not on bp medication.  For diabetes did order urine microalbumin. Continue  on mefformin and follow up with endocrinologist.  Refilled your crestor today.  For left wrist pain possible ganglion cyst placed referral to  sport med MD.   In addition [phone] charge as did address htn, diabetes(ordered urine microalbumin, hypothyroid, refilled crestor and placed referral to sports med MD   Mackie Pai, PA-C

## 2022-07-23 NOTE — Patient Instructions (Addendum)
For you wellness exam today I have ordered cbc, cmp and  lipid panel.   Vaccine given today flu vaccine  Recommend exercise and healthy diet.   We will let you know lab results as they come in.   Follow up date appointment will be determined after lab review.    Htn- bp borderline today but not on bp medication. Please start back today.  For diabetes did order urine microalbumin. Continue  on mefformin and follow up with endocrinologist.  Refilled your crestor today.  For left wrist pain possible ganglion cyst placed referral to sport med MD.    Preventive Care 59-74 Years Old, Female Preventive care refers to lifestyle choices and visits with your health care provider that can promote health and wellness. Preventive care visits are also called wellness exams. What can I expect for my preventive care visit? Counseling Your health care provider may ask you questions about your: Medical history, including: Past medical problems. Family medical history. Pregnancy history. Current health, including: Menstrual cycle. Method of birth control. Emotional well-being. Home life and relationship well-being. Sexual activity and sexual health. Lifestyle, including: Alcohol, nicotine or tobacco, and drug use. Access to firearms. Diet, exercise, and sleep habits. Work and work Statistician. Sunscreen use. Safety issues such as seatbelt and bike helmet use. Physical exam Your health care provider will check your: Height and weight. These may be used to calculate your BMI (body mass index). BMI is a measurement that tells if you are at a healthy weight. Waist circumference. This measures the distance around your waistline. This measurement also tells if you are at a healthy weight and may help predict your risk of certain diseases, such as type 2 diabetes and high blood pressure. Heart rate and blood pressure. Body temperature. Skin for abnormal spots. What immunizations do I  need?  Vaccines are usually given at various ages, according to a schedule. Your health care provider will recommend vaccines for you based on your age, medical history, and lifestyle or other factors, such as travel or where you work. What tests do I need? Screening Your health care provider may recommend screening tests for certain conditions. This may include: Lipid and cholesterol levels. Diabetes screening. This is done by checking your blood sugar (glucose) after you have not eaten for a while (fasting). Pelvic exam and Pap test. Hepatitis B test. Hepatitis C test. HIV (human immunodeficiency virus) test. STI (sexually transmitted infection) testing, if you are at risk. Lung cancer screening. Colorectal cancer screening. Mammogram. Talk with your health care provider about when you should start having regular mammograms. This may depend on whether you have a family history of breast cancer. BRCA-related cancer screening. This may be done if you have a family history of breast, ovarian, tubal, or peritoneal cancers. Bone density scan. This is done to screen for osteoporosis. Talk with your health care provider about your test results, treatment options, and if necessary, the need for more tests. Follow these instructions at home: Eating and drinking  Eat a diet that includes fresh fruits and vegetables, whole grains, lean protein, and low-fat dairy products. Take vitamin and mineral supplements as recommended by your health care provider. Do not drink alcohol if: Your health care provider tells you not to drink. You are pregnant, may be pregnant, or are planning to become pregnant. If you drink alcohol: Limit how much you have to 0-1 drink a day. Know how much alcohol is in your drink. In the [place] one drink equals one  12 oz bottle of beer (355 mL), one 5 oz glass of wine (148 mL), or one 1 oz glass of hard liquor (44 mL). Lifestyle Brush your teeth every morning and night with  fluoride toothpaste. Floss one time each day. Exercise for at least 30 minutes 5 or more days each week. Do not use any products that contain nicotine or tobacco. These products include cigarettes, chewing tobacco, and vaping devices, such as e-cigarettes. If you need help quitting, ask your health care provider. Do not use drugs. If you are sexually active, practice safe sex. Use a condom or other form of protection to prevent STIs. If you do not wish to become pregnant, use a form of birth control. If you plan to become pregnant, see your health care provider for a prepregnancy visit. Take aspirin only as told by your health care provider. Make sure that you understand how much to take and what form to take. Work with your health care provider to find out whether it is safe and beneficial for you to take aspirin daily. Find healthy ways to manage stress, such as: Meditation, yoga, or listening to music. Journaling. Talking to a trusted person. Spending time with friends and family. Minimize exposure to UV radiation to reduce your risk of skin cancer. Safety Always wear your seat belt while driving or riding in a vehicle. Do not drive: If you have been drinking alcohol. Do not ride with someone who has been drinking. When you are tired or distracted. While texting. If you have been using any mind-altering substances or drugs. Wear a helmet and other protective equipment during sports activities. If you have firearms in your house, make sure you follow all gun safety procedures. Seek help if you have been physically or sexually abused. What's next? Visit your health care provider once a year for an annual wellness visit. Ask your health care provider how often you should have your eyes and teeth checked. Stay up to date on all vaccines. This information is not intended to replace advice given to you by your health care provider. Make sure you discuss any questions you have with your health  care provider. Document Revised: 04/05/2021 Document Reviewed: 04/05/2021 Elsevier Patient Education  [place].

## 2022-07-24 LAB — MICROALBUMIN, URINE: Microalb, Ur: 0.2 mg/dL

## 2022-07-26 ENCOUNTER — Other Ambulatory Visit (HOSPITAL_BASED_OUTPATIENT_CLINIC_OR_DEPARTMENT_OTHER): Payer: Self-pay

## 2022-07-26 ENCOUNTER — Ambulatory Visit (INDEPENDENT_AMBULATORY_CARE_PROVIDER_SITE_OTHER): Admitting: Family Medicine

## 2022-07-26 ENCOUNTER — Encounter: Payer: Self-pay | Admitting: Family Medicine

## 2022-07-26 ENCOUNTER — Ambulatory Visit: Payer: Self-pay

## 2022-07-26 VITALS — BP 140/80 | Ht 63.0 in | Wt 208.0 lb

## 2022-07-26 DIAGNOSIS — M25532 Pain in left wrist: Secondary | ICD-10-CM

## 2022-07-26 MED ORDER — PREDNISONE 5 MG PO TABS
ORAL_TABLET | ORAL | 0 refills | Status: AC
  Filled 2022-07-26: qty 21, 6d supply, fill #0

## 2022-07-26 NOTE — Progress Notes (Signed)
  Deborah Henderson - 56 y.o. female MRN [id number]  Date of birth: June 09, 1966  SUBJECTIVE:  Including CC & ROS.  No chief complaint on file.   Deborah Henderson is a 56 y.o. female that is presenting with acute left ulnar-sided wrist pain.  Has been ongoing for a few weeks.  Having swelling over the area.  Pain is worse with any repetitive activity.  No injury or inciting event.  Spanish interpreter was used for this interview.   Review of Systems See HPI   HISTORY: Past Medical, Surgical, Social, and Family History Reviewed & Updated per EMR.   Pertinent Historical Findings include:  Past Medical History:  Diagnosis Date   Diabetes mellitus ([place])    type 2   Diverticulosis    Hyperlipidemia    Hypertension    Hypothyroidism    Obesity    Sleep apnea    Currently on CPAP machine    Past Surgical History:  Procedure Laterality Date   CESAREAN SECTION     x2   CHOLECYSTECTOMY     CHOLECYSTECTOMY, LAPAROSCOPIC  06/02/2015   [place]   COLONOSCOPY  02/04/2015   due 2021 per patient    ECTOPIC PREGNANCY SURGERY     ESOPHAGOGASTRODUODENOSCOPY  2015   OVARIAN CYST SURGERY  1988   Left   ROBOTIC ASSISTED BILATERAL SALPINGO OOPHERECTOMY N/A 05/21/2017   Procedure: XI ROBOTIC ASSIST  LEFT SALPINGO OOPHORECTOMY, LYSIS OF ADHESIONS;  Surgeon: Nancy Marus, MD;  Location: [facility];  Service: Gynecology;  Laterality: N/A;     PHYSICAL EXAM:  VS: BP (!) 140/80 (BP Location: Right Arm, Patient Position: Sitting)   Ht '5\' 3"'$  (1.6 m)   Wt 208 lb (94.3 kg)   BMI 36.85 kg/m  Physical Exam Gen: NAD, alert, cooperative with exam, well-appearing MSK:  Neurovascularly intact    Limited ultrasound: Left wrist:  Mild effusion within the carpal joints distal to the distal ulna. No changes of the TFCC. Mild effusion at the distal portion of the ECU. No hyperemia appreciated within the carpal joints.  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  Ultrasound and interpretation by Clearance Coots, MD    ASSESSMENT & PLAN:   Arthralgia of wrist, left Acutely occurring.  Findings most consistent with degenerative changes within the wrist causing swelling.  No cyst appreciated and less likely for ECU tendinitis. -Counseled on home exercise therapy and supportive care. -Brace. -Prednisone. -Could consider physical therapy or further imaging.

## 2022-07-26 NOTE — Patient Instructions (Signed)
Nice to meet you Please try ice  Please try the brace  Please try the exercises   Please send me a message in MyChart with any questions or updates.  Please see me back in 4 weeks.   --Dr. Raeford Razor

## 2022-07-26 NOTE — Assessment & Plan Note (Signed)
Acutely occurring.  Findings most consistent with degenerative changes within the wrist causing swelling.  No cyst appreciated and less likely for ECU tendinitis. -Counseled on home exercise therapy and supportive care. -Brace. -Prednisone. -Could consider physical therapy or further imaging.

## 2022-07-31 ENCOUNTER — Other Ambulatory Visit (HOSPITAL_BASED_OUTPATIENT_CLINIC_OR_DEPARTMENT_OTHER): Payer: Self-pay

## 2022-08-01 ENCOUNTER — Other Ambulatory Visit (HOSPITAL_BASED_OUTPATIENT_CLINIC_OR_DEPARTMENT_OTHER): Payer: Self-pay

## 2022-08-01 ENCOUNTER — Telehealth: Payer: Self-pay | Admitting: Medical

## 2022-08-01 MED ORDER — GLIPIZIDE 10 MG PO TABS
10.0000 mg | ORAL_TABLET | Freq: Two times a day (BID) | ORAL | 3 refills | Status: DC
  Filled 2022-08-01: qty 180, 90d supply, fill #0
  Filled 2022-11-25: qty 180, 90d supply, fill #1
  Filled 2023-02-20: qty 180, 90d supply, fill #2
  Filled 2023-05-22: qty 180, 90d supply, fill #3

## 2022-08-01 NOTE — Telephone Encounter (Signed)
Rx sent to [facility]

## 2022-08-01 NOTE — Telephone Encounter (Signed)
Pt came in office requesting for rx glipiZIDE (GLUCOTROL) 10 MG tablet  be sent to [place], pt mentioned rx was already sent to [facility] on 10-02 but pt did not pick up there cause it is cheaper for her to get it at [facility]. Pt mentioned that is the only one that is needed to be changed to [place]. Please advise.

## 2022-08-14 ENCOUNTER — Ambulatory Visit: Admitting: Internal Medicine

## 2022-08-27 ENCOUNTER — Other Ambulatory Visit (HOSPITAL_BASED_OUTPATIENT_CLINIC_OR_DEPARTMENT_OTHER): Payer: Self-pay

## 2022-08-30 ENCOUNTER — Ambulatory Visit (INDEPENDENT_AMBULATORY_CARE_PROVIDER_SITE_OTHER): Admitting: Family Medicine

## 2022-08-30 VITALS — BP 136/80 | Ht 64.0 in | Wt 207.0 lb

## 2022-08-30 DIAGNOSIS — M25532 Pain in left wrist: Secondary | ICD-10-CM

## 2022-08-30 NOTE — Progress Notes (Signed)
  Deborah Henderson - 56 y.o. female MRN [id number]  Date of birth: [birth year]  SUBJECTIVE:  Including CC & ROS.  No chief complaint on file.   Deborah Henderson is a 56 y.o. female that is following up for her left wrist pain.  She denies any pain today but does get swelling on the ulnar aspect of the wrist intermittently.  She only notices this when she lifts something heavy.  An in person Spanish interpreter was used for this interview.  Review of Systems See HPI   HISTORY: Past Medical, Surgical, Social, and Family History Reviewed & Updated per EMR.   Pertinent Historical Findings include:  Past Medical History:  Diagnosis Date   Diabetes mellitus ([place])    type 2   Diverticulosis    Hyperlipidemia    Hypertension    Hypothyroidism    Obesity    Sleep apnea    Currently on CPAP machine    Past Surgical History:  Procedure Laterality Date   CESAREAN SECTION     x2   CHOLECYSTECTOMY     CHOLECYSTECTOMY, LAPAROSCOPIC  06/02/2015   [place]   COLONOSCOPY  02/04/2015   due 2021 per patient    ECTOPIC PREGNANCY SURGERY     ESOPHAGOGASTRODUODENOSCOPY  2015   OVARIAN CYST SURGERY  1988   Left   ROBOTIC ASSISTED BILATERAL SALPINGO OOPHERECTOMY N/A 05/21/2017   Procedure: XI ROBOTIC ASSIST  LEFT SALPINGO OOPHORECTOMY, LYSIS OF ADHESIONS;  Surgeon: Nancy Marus, MD;  Location: [facility];  Service: Gynecology;  Laterality: N/A;     PHYSICAL EXAM:  VS: BP 136/80   Ht '5\' 4"'$  (1.626 m)   Wt 207 lb (93.9 kg)   BMI 35.53 kg/m  Physical Exam Gen: NAD, alert, cooperative with exam, well-appearing MSK:  Neurovascularly intact       ASSESSMENT & PLAN:   Arthralgia of wrist, left Doing well with no pain today.  Does get swelling intermittently. -Counseled on home exercise therapy and supportive care. -Could consider further imaging or physical therapy.

## 2022-08-30 NOTE — Assessment & Plan Note (Signed)
Doing well with no pain today.  Does get swelling intermittently. -Counseled on home exercise therapy and supportive care. -Could consider further imaging or physical therapy.

## 2022-08-30 NOTE — Patient Instructions (Signed)
Good to see you  Please send me a message in MyChart with any questions or updates.  Please see me back as needed.   --Dr. Raeford Razor

## 2022-10-01 ENCOUNTER — Other Ambulatory Visit (HOSPITAL_BASED_OUTPATIENT_CLINIC_OR_DEPARTMENT_OTHER): Payer: Self-pay | Admitting: Obstetrics & Gynecology

## 2022-10-01 DIAGNOSIS — Z1231 Encounter for screening mammogram for malignant neoplasm of breast: Secondary | ICD-10-CM

## 2022-10-31 ENCOUNTER — Ambulatory Visit (INDEPENDENT_AMBULATORY_CARE_PROVIDER_SITE_OTHER): Admitting: Obstetrics & Gynecology

## 2022-10-31 ENCOUNTER — Encounter: Payer: Self-pay | Admitting: Obstetrics & Gynecology

## 2022-10-31 VITALS — BP 124/86 | HR 86 | Ht 63.75 in | Wt 208.0 lb

## 2022-10-31 DIAGNOSIS — Z01419 Encounter for gynecological examination (general) (routine) without abnormal findings: Secondary | ICD-10-CM | POA: Diagnosis not present

## 2022-10-31 DIAGNOSIS — Z780 Asymptomatic menopausal state: Secondary | ICD-10-CM

## 2022-10-31 NOTE — Progress Notes (Signed)
Deborah Henderson Sep 17, 1966 [id number]   History:    57 y.o. . G45P2A1L2 Married   RP:  Established patient presenting for annual gyn exam    HPI: Robotic Left SO, Lysis of Omental Adhesions on 05/21/2017 with Dr Alycia Rossetti.  Patho: Benign mucinous Cystadenoma.  No complication.  Postmenopause x 2016, well on no HRT.  No PMB. No pelvic pain. C/O dryness and pain with IC, improved with coconut oil. Pap 08/2020 Neg. Pap test to repeat at 3 years.  Urine/BMs wnl.  Breasts wnl.  Screening mammo 10/2021, scheduled 11/05/22. BMI 35.98 stable x 2 years.  Walking more.  Decreased calories/carbs in her diet to loose weight.  [facility] with Fam MD. Harriet Masson 03/2022. Flu vaccine done at pcp.   Past medical history,surgical history, family history and social history were all reviewed and documented in the EPIC chart.  Gynecologic History No LMP recorded. Patient is postmenopausal.  Obstetric History OB History  Gravida Para Term Preterm AB Living  '3 2 2   1 2  '$ SAB IAB Ectopic Multiple Live Births      1        # Outcome Date GA Lbr Len/2nd Weight Sex Delivery Anes PTL Lv  3 Ectopic           2 Term           1 Term              ROS: A ROS was performed and pertinent positives and negatives are included in the history. GENERAL: No fevers or chills. HEENT: No change in vision, no earache, sore throat or sinus congestion. NECK: No pain or stiffness. CARDIOVASCULAR: No chest pain or pressure. No palpitations. PULMONARY: No shortness of breath, cough or wheeze. GASTROINTESTINAL: No abdominal pain, nausea, vomiting or diarrhea, melena or bright red blood per rectum. GENITOURINARY: No urinary frequency, urgency, hesitancy or dysuria. MUSCULOSKELETAL: No joint or muscle pain, no back pain, no recent trauma. DERMATOLOGIC: No rash, no itching, no lesions. ENDOCRINE: No polyuria, polydipsia, no heat or cold intolerance. No recent change in weight. HEMATOLOGICAL: No anemia or easy bruising or bleeding. NEUROLOGIC:  No headache, seizures, numbness, tingling or weakness. PSYCHIATRIC: No depression, no loss of interest in normal activity or change in sleep pattern.     Exam:   BP 124/86   Pulse 86   Ht 5' 3.75" (1.619 m)   Wt 208 lb (94.3 kg)   SpO2 96%   BMI 35.98 kg/m   Body mass index is 35.98 kg/m.  General appearance : Well developed well nourished female. No acute distress HEENT: Eyes: no retinal hemorrhage or exudates,  Neck supple, trachea midline, no carotid bruits, no thyroidmegaly Lungs: Clear to auscultation, no rhonchi or wheezes, or rib retractions  Heart: Regular rate and rhythm, no murmurs or gallops Breast:Examined in sitting and supine position were symmetrical in appearance, no palpable masses or tenderness,  no skin retraction, no nipple inversion, no nipple discharge, no skin discoloration, no axillary or supraclavicular lymphadenopathy Abdomen: no palpable masses or tenderness, no rebound or guarding Extremities: no edema or skin discoloration or tenderness  Pelvic: Vulva: Normal             Vagina: No gross lesions or discharge  Cervix: No gross lesions or discharge  Uterus  AV, normal size, shape and consistency, non-tender and mobile  Adnexa  Without masses or tenderness  Anus: Normal   Assessment/Plan:  57 y.o. female for annual exam   1. Well  female exam with routine gynecological exam Robotic Left SO, Lysis of Omental Adhesions on 05/21/2017 with Dr Alycia Rossetti.  Patho: Benign mucinous Cystadenoma.  No complication.  Postmenopause x 2016, well on no HRT.  No PMB. No pelvic pain. C/O dryness and pain with IC, improved with coconut oil. Pap 08/2020 Neg. Pap test to repeat at 3 years.  Urine/BMs wnl.  Breasts wnl.  Screening mammo 10/2021, scheduled 11/05/22. BMI 35.98 stable x 2 years.  Walking more.  Decreased calories/carbs in her diet to loose weight.  [facility] with Fam MD. Harriet Masson 03/2022. Flu vaccine done at pcp.   2. Postmenopause Postmenopause x 2016, well on no  HRT.  No PMB. No pelvic pain. C/O dryness and pain with IC, improved with coconut oil.  3. Class 2 severe obesity due to excess calories with serious comorbidity and body mass index (BMI) of 35.0 to 35.9 in adult (HCC) BMI 35.98 stable x 2 years.  Walking more.  Decreased calories/carbs in her diet to loose weight.    Other orders - Multiple Vitamin (MULTIVITAMIN PO); Take by mouth.   Princess Bruins MD, 9:58 AM

## 2022-11-05 ENCOUNTER — Ambulatory Visit (HOSPITAL_BASED_OUTPATIENT_CLINIC_OR_DEPARTMENT_OTHER)
Admission: RE | Admit: 2022-11-05 | Discharge: 2022-11-05 | Disposition: A | Discharge status: Home / Self Care | Source: Ambulatory Visit | Attending: Obstetrics & Gynecology | Admitting: Obstetrics & Gynecology

## 2022-11-05 ENCOUNTER — Encounter (HOSPITAL_BASED_OUTPATIENT_CLINIC_OR_DEPARTMENT_OTHER): Payer: Self-pay

## 2022-11-05 DIAGNOSIS — Z1231 Encounter for screening mammogram for malignant neoplasm of breast: Secondary | ICD-10-CM | POA: Diagnosis not present

## 2023-01-16 ENCOUNTER — Encounter: Payer: Self-pay | Admitting: Primary Care

## 2023-01-16 ENCOUNTER — Ambulatory Visit (INDEPENDENT_AMBULATORY_CARE_PROVIDER_SITE_OTHER): Admitting: Primary Care

## 2023-01-16 VITALS — BP 138/78 | HR 87 | Ht 63.0 in | Wt 211.8 lb

## 2023-01-16 DIAGNOSIS — G4733 Obstructive sleep apnea (adult) (pediatric): Secondary | ICD-10-CM | POA: Diagnosis not present

## 2023-01-16 NOTE — Progress Notes (Signed)
@Patient  ID: Deborah Henderson, female    DOB: 04/12/1966, 57 y.o.   MRN: [id number]  Chief Complaint  Patient presents with   Consult    Sleep OSA on CPAP [place] 11    Referring provider: Elise Benne  HPI: 57 year old female, former smoker.  Past medical history significant for OSA on CPAP, hypersomnia, diabetes, multinodular goiter, dyslipidemia.  01/16/2023 Patient presents today for sleep consult. Patient had home sleep study on 02/27/2017 that showed evidence of mild to moderate obstructive sleep apnea, average AHI 14.8 an hour with SpO2 low 77% (average 92%). She is currently on auto CPAP. She is compliant with use. She is sleeping well at night.  Typical bedtime is between 10 and 11 PM.  It takes her on average 30 minutes to fall asleep.  She wakes up twice a night.  She starts her day between 6 and 7 AM.  She does tend to fall asleep if inactive but denies excessive daytime sleepiness. She is needing to establish with a new DME company for CPAP supplies.  Her current machine is greater than 5 years and is due to be replaced.  Denies symptoms of narcolepsy, cataplexy or sleepwalking.  Airview download 12/16/2022 - 01/14/2023 Usage 30/30 days (100%) greater than 4 hours Average usage 7 hours 22 minutes Pressure 5 to 10 cm H2O (10.0 cm H2O-95%) Air leaks 15.4 L/min (95%) AHI 0.1  Allergies  Allergen Reactions   Asa [Aspirin] Anaphylaxis and Rash    Immunization History  Administered Date(s) Administered   Influenza, High Dose Seasonal PF 07/17/2017   Influenza,inj,Quad PF,6+ Mos 08/08/2018, 07/24/2019, 07/21/2020, 07/14/2021, 07/23/2022   Influenza-Unspecified 07/30/2016   PFIZER Comirnaty(Gray Top)Covid-19 Tri-Sucrose Vaccine 02/21/2021   PFIZER(Purple Top)SARS-COV-2 Vaccination 01/09/2020, 01/30/2020, 08/01/2020   Pneumococcal Conjugate-13 07/17/2017   Pneumococcal Polysaccharide-23 12/25/2017   Tdap 03/04/2017    Past Medical History:  Diagnosis Date    Diabetes mellitus ([place])    type 2   Diverticulosis    Hyperlipidemia    Hypertension    Hypothyroidism    Obesity    Sleep apnea    Currently on CPAP machine    Tobacco History: Social History   Tobacco Use  Smoking Status Former   Types: Cigarettes   Quit date: 11/09/1989   Years since quitting: 33.2  Smokeless Tobacco Never  Tobacco Comments   Quit >20 years ago   Counseling given: Not Answered Tobacco comments: Quit >20 years ago   Outpatient Medications Prior to Visit  Medication Sig Dispense Refill   glipiZIDE (GLUCOTROL) 10 MG tablet Take 1 tablet (10 mg total) by mouth 2 (two) times daily before a meal. 180 tablet 3   levothyroxine (SYNTHROID) 25 MCG tablet TAKE 1 TABLET (25 MCG TOTAL) BY MOUTH DAILY BEFORE BREAKFAST. 30 tablet 11   losartan-hydrochlorothiazide (HYZAAR) 50-12.5 MG tablet TAKE 1 TABLET BY MOUTH DAILY. 90 tablet 3   metFORMIN (GLUCOPHAGE) 1000 MG tablet Take 1 tablet (1,000 mg total) by mouth 2 (two) times daily with a meal. 180 tablet 3   Multiple Vitamin (MULTIVITAMIN PO) Take by mouth.     polyethylene glycol powder (GLYCOLAX/MIRALAX) 17 GM/SCOOP powder Take 17 g by mouth 2 (two) times daily as needed. (Patient taking differently: Take 17 g by mouth as needed.) 3350 g 1   rosuvastatin (CRESTOR) 10 MG tablet Take 1 tablet (10 mg total) by mouth daily. 90 tablet 3   No facility-administered medications prior to visit.   Review of Systems  Review of Systems  Constitutional:  Negative.  Negative for fatigue.  HENT: Negative.    Respiratory:  Negative for shortness of breath.   Psychiatric/Behavioral:  Negative for sleep disturbance.    Physical Exam  BP 138/78 (BP Location: Left Arm, Patient Position: Sitting, Cuff Size: Large)   Pulse 87   Ht 5\' 3"  (1.6 m)   Wt 211 lb 12.8 oz (96.1 kg)   SpO2 97%   BMI 37.52 kg/m  Physical Exam Constitutional:      General: She is not in acute distress.    Appearance: Normal appearance. She is not  ill-appearing.  HENT:     Head: Normocephalic and atraumatic.     Mouth/Throat:     Mouth: Mucous membranes are moist.     Pharynx: Oropharynx is clear.  Cardiovascular:     Rate and Rhythm: Normal rate and regular rhythm.  Pulmonary:     Effort: Pulmonary effort is normal.     Breath sounds: Normal breath sounds. No wheezing.  Neurological:     General: No focal deficit present.     Mental Status: She is alert and oriented to person, place, and time. Mental status is at baseline.  Psychiatric:        Mood and Affect: Mood normal.        Behavior: Behavior normal.        Thought Content: Thought content normal.        Judgment: Judgment normal.      Lab Results:  CBC    Component Value Date/Time   WBC 7.7 07/23/2022 1018   RBC 4.46 07/23/2022 1018   HGB 13.4 07/23/2022 1018   HCT 39.7 07/23/2022 1018   PLT 209.0 07/23/2022 1018   MCV 88.9 07/23/2022 1018   MCH 30.3 06/23/2020 1032   MCHC 33.7 07/23/2022 1018   RDW 14.7 07/23/2022 1018   LYMPHSABS 2.6 07/23/2022 1018   MONOABS 0.5 07/23/2022 1018   EOSABS 0.0 07/23/2022 1018   BASOSABS 0.0 07/23/2022 1018    BMET    Component Value Date/Time   NA 137 07/23/2022 1018   K 3.9 07/23/2022 1018   CL 103 07/23/2022 1018   CO2 24 07/23/2022 1018   GLUCOSE 144 (H) 07/23/2022 1018   BUN 15 07/23/2022 1018   CREATININE 0.81 07/23/2022 1018   CREATININE 0.78 07/07/2020 1048   CALCIUM 10.0 07/23/2022 1018   GFRNONAA >60 05/10/2017 1030   GFRNONAA 84 11/12/2016 0821   GFRAA >60 05/10/2017 1030   GFRAA >89 11/12/2016 0821    BNP No results found for: "BNP"  ProBNP No results found for: "PROBNP"  Imaging: No results found.   Assessment & Plan:   OSA on CPAP - Home sleep study on 02/27/2017 showed evidence of mild to moderate obstructive sleep apnea, average AHI 14.8 now with SpO2 low 77%. Maintained on auto CPAP.  She is 100% compliant with CPAP use > 4 hours last 30 days. No issues with pressure settings or  mask fit. Current pressure 5-10cm h20; Residual AHI 0.1/hour. Her CPAP machine is >58 years old. Patient would like to change DME companies, she no longer wants to use [facility]. Order placed for replacement CPAP machine and renew CPAP supplies. Advised patient continue her CPAP nightly for minimum 4 to 6 hours or longer.  Encouraged weight loss efforts.  Follow-up 3 months after new CPAP for compliance check.  Martyn Ehrich, NP 01/16/2023

## 2023-01-16 NOTE — Assessment & Plan Note (Signed)
-   Home sleep study on 02/27/2017 showed evidence of mild to moderate obstructive sleep apnea, average AHI 14.8 now with SpO2 low 77%. Maintained on auto CPAP.  She is 100% compliant with CPAP use > 4 hours last 30 days. No issues with pressure settings or mask fit. Current pressure 5-10cm h20; Residual AHI 0.1/hour. Her CPAP machine is >56 years old. Patient would like to change DME companies, she no longer wants to use [facility]. Order placed for replacement CPAP machine and renew CPAP supplies. Advised patient continue her CPAP nightly for minimum 4 to 6 hours or longer.  Encouraged weight loss efforts.  Follow-up 3 months after new CPAP for compliance check.

## 2023-01-16 NOTE — Patient Instructions (Signed)
Recommendations: Continue to wear CPAP nightly 4-6 hours  Orders: New CPAP and supplies / pressure 5-10cm h20 (dme- prefer not [facility])  Follow-up: 3 months with Eustaquio Maize NP

## 2023-02-04 ENCOUNTER — Encounter: Payer: Self-pay | Admitting: *Deleted

## 2023-02-18 ENCOUNTER — Other Ambulatory Visit: Payer: Self-pay | Admitting: Internal Medicine

## 2023-02-19 ENCOUNTER — Other Ambulatory Visit: Payer: Self-pay | Admitting: Internal Medicine

## 2023-02-19 ENCOUNTER — Other Ambulatory Visit (HOSPITAL_BASED_OUTPATIENT_CLINIC_OR_DEPARTMENT_OTHER): Payer: Self-pay

## 2023-02-20 ENCOUNTER — Other Ambulatory Visit: Payer: Self-pay | Admitting: Medical

## 2023-02-20 ENCOUNTER — Other Ambulatory Visit (HOSPITAL_BASED_OUTPATIENT_CLINIC_OR_DEPARTMENT_OTHER): Payer: Self-pay

## 2023-02-20 MED ORDER — METFORMIN HCL 1000 MG PO TABS
1000.0000 mg | ORAL_TABLET | Freq: Two times a day (BID) | ORAL | 3 refills | Status: DC
  Filled 2023-02-20: qty 180, 90d supply, fill #0
  Filled 2023-05-22: qty 180, 90d supply, fill #1
  Filled 2023-08-20: qty 180, 90d supply, fill #2
  Filled 2023-11-20: qty 180, 90d supply, fill #3

## 2023-04-15 ENCOUNTER — Telehealth: Payer: Self-pay | Admitting: Medical

## 2023-04-15 ENCOUNTER — Other Ambulatory Visit (HOSPITAL_BASED_OUTPATIENT_CLINIC_OR_DEPARTMENT_OTHER): Payer: Self-pay

## 2023-04-15 MED ORDER — LEVOTHYROXINE SODIUM 25 MCG PO TABS
25.0000 ug | ORAL_TABLET | Freq: Every day | ORAL | 11 refills | Status: DC
  Filled 2023-04-15: qty 30, 30d supply, fill #0
  Filled 2023-05-10: qty 30, 30d supply, fill #1
  Filled 2023-06-08: qty 30, 30d supply, fill #2
  Filled 2023-07-09: qty 30, 30d supply, fill #3
  Filled 2023-08-07: qty 30, 30d supply, fill #4
  Filled 2023-09-04: qty 30, 30d supply, fill #5
  Filled 2023-10-07: qty 30, 30d supply, fill #6
  Filled 2023-11-05: qty 30, 30d supply, fill #7
  Filled 2023-12-04: qty 30, 30d supply, fill #8
  Filled 2024-01-02: qty 30, 30d supply, fill #9
  Filled 2024-02-03: qty 30, 30d supply, fill #10
  Filled 2024-03-04: qty 30, 30d supply, fill #11

## 2023-04-15 NOTE — Telephone Encounter (Signed)
Pt came in office stating needing refill on levothyroxine (SYNTHROID) 25 MCG tablet sent to  [facility] [place] [facility] [address], [address] [place] [address] Phone: [phone]  Fax: [phone] DEA #: [id number]  Pt would like the same amount as usual for refills, Please advise.

## 2023-04-15 NOTE — Telephone Encounter (Signed)
Rx sent 

## 2023-04-22 ENCOUNTER — Ambulatory Visit: Admitting: Primary Care

## 2023-04-23 ENCOUNTER — Encounter: Payer: Self-pay | Admitting: Primary Care

## 2023-04-23 ENCOUNTER — Ambulatory Visit (INDEPENDENT_AMBULATORY_CARE_PROVIDER_SITE_OTHER): Admitting: Primary Care

## 2023-04-23 VITALS — BP 128/72 | HR 80 | Temp 98.4°F | Ht 63.0 in | Wt 212.0 lb

## 2023-04-23 DIAGNOSIS — G4733 Obstructive sleep apnea (adult) (pediatric): Secondary | ICD-10-CM | POA: Diagnosis not present

## 2023-04-23 NOTE — Patient Instructions (Addendum)
Excellent compliance with CPAP Sleep apnea is well controlled on current pressure settings  No changes   Recommendations: Continue to wear CPAP nightly Work on weight loss  Orders: Renew cpap supplies  (Airtouch F20 cushion size M)   Follow-up: 1 year with Beth NP    ______________________________________________________  Excelente cumplimiento de CPAP La apnea del sueo est bien controlada con los ajustes de presin actuales  Sin cambios   Recomendaciones: Contine usando CPAP todas las noches [place] en la prdida de [place].  Pedidos: Renovar consumibles cpap (cojn Airtouch F20 talla M)   Hacer un seguimiento: 1 ao Docia Chuck NP

## 2023-04-23 NOTE — Progress Notes (Signed)
@Patient  ID: Deborah Henderson, female    DOB: [birth year], 57 y.o.   MRN: [id number]  Chief Complaint  Patient presents with   Follow-up    Doing well.    Referring provider: Marisue Brooklyn  HPI: 57 year old female, former smoker.  Past medical history significant for OSA on CPAP, hypersomnia, diabetes, multinodular goiter, dyslipidemia.  01/16/2023 Patient presents today for sleep consult. Patient had home sleep study on 02/27/2017 that showed evidence of mild to moderate obstructive sleep apnea, average AHI 14.8 an hour with SpO2 low 77% (average 92%). She is currently on auto CPAP. She is compliant with use. She is sleeping well at night.  Typical bedtime is between 10 and 11 PM.  It takes her on average 30 minutes to fall asleep.  She wakes up twice a night.  She starts her day between 6 and 7 AM.  She does tend to fall asleep if inactive but denies excessive daytime sleepiness. She is needing to establish with a new DME company for CPAP supplies.  Her current machine is greater than 5 years and is due to be replaced.  Denies symptoms of narcolepsy, cataplexy or sleepwalking.  Airview download 12/16/2022 - 01/14/2023 Usage 30/30 days (100%) greater than 4 hours Average usage 7 hours 22 minutes Pressure 5 to 10 cm H2O (10.0 cm H2O-95%) Air leaks 15.4 L/min (95%) AHI 0.1  04/23/2023- interim hx  Patient presents today for 3-4 month follow-up. She is doing well. Reports compliance with CPAP use. No issues with mask fit or pressure settings. She received new CPAP in April 2024. Needs order for CPAP supplies. She sleeps well at night and feels rested in the morning.   Airview download 03/24/23-04/22/23 Usage days 30/30 days; 97% > 4 hours Average usage 7 hours 0 mins Current pressure 5-10cm h20 (10cm h20-95%) Airleaks 22L/min (95%) AHI 0.1   Allergies  Allergen Reactions   Asa [Aspirin] Anaphylaxis and Rash    Immunization History  Administered Date(s) Administered    Influenza, High Dose Seasonal PF 07/17/2017   Influenza,inj,Quad PF,6+ Mos 08/08/2018, 07/24/2019, 07/21/2020, 07/14/2021, 07/23/2022   Influenza-Unspecified 07/30/2016   PFIZER Comirnaty(Gray Top)Covid-19 Tri-Sucrose Vaccine 02/21/2021   PFIZER(Purple Top)SARS-COV-2 Vaccination 01/09/2020, 01/30/2020, 08/01/2020   Pneumococcal Conjugate-13 07/17/2017   Pneumococcal Polysaccharide-23 12/25/2017   Tdap 03/04/2017    Past Medical History:  Diagnosis Date   Diabetes mellitus (HCC)    type 2   Diverticulosis    Hyperlipidemia    Hypertension    Hypothyroidism    Obesity    Sleep apnea    Currently on CPAP machine    Tobacco History: Social History   Tobacco Use  Smoking Status Former   Types: Cigarettes   Quit date: 11/09/1989   Years since quitting: 33.4  Smokeless Tobacco Never  Tobacco Comments   Quit >20 years ago   Counseling given: Not Answered Tobacco comments: Quit >20 years ago   Outpatient Medications Prior to Visit  Medication Sig Dispense Refill   glipiZIDE (GLUCOTROL) 10 MG tablet Take 1 tablet (10 mg total) by mouth 2 (two) times daily before a meal. 180 tablet 3   levothyroxine (SYNTHROID) 25 MCG tablet Take 1 tablet (25 mcg total) by mouth daily before breakfast. 30 tablet 11   losartan-hydrochlorothiazide (HYZAAR) 50-12.5 MG tablet TAKE 1 TABLET BY MOUTH DAILY. 90 tablet 3   metFORMIN (GLUCOPHAGE) 1000 MG tablet Take 1 tablet (1,000 mg total) by mouth 2 (two) times daily with a meal. 180 tablet 3   Multiple  Vitamin (MULTIVITAMIN PO) Take by mouth.     polyethylene glycol powder (GLYCOLAX/MIRALAX) 17 GM/SCOOP powder Take 17 g by mouth 2 (two) times daily as needed. (Patient taking differently: Take 17 g by mouth as needed.) 3350 g 1   rosuvastatin (CRESTOR) 10 MG tablet Take 1 tablet (10 mg total) by mouth daily. 90 tablet 3   No facility-administered medications prior to visit.   Review of Systems  Review of Systems  Constitutional: Negative.   HENT:  Negative.    Respiratory: Negative.    Cardiovascular: Negative.    Physical Exam  BP 128/72 (BP Location: Right Arm, Patient Position: Sitting, Cuff Size: Large)   Pulse 80   Temp 98.4 F (36.9 C) (Oral)   Ht 5\' 3"  (1.6 m)   Wt 212 lb (96.2 kg)   SpO2 97%   BMI 37.55 kg/m  Physical Exam Constitutional:      Appearance: Normal appearance.  HENT:     Head: Normocephalic and atraumatic.     Mouth/Throat:     Mouth: Mucous membranes are moist.     Pharynx: Oropharynx is clear.  Cardiovascular:     Rate and Rhythm: Normal rate and regular rhythm.  Pulmonary:     Effort: Pulmonary effort is normal.     Breath sounds: Normal breath sounds.  Skin:    General: Skin is warm and dry.  Neurological:     General: No focal deficit present.     Mental Status: She is alert and oriented to person, place, and time. Mental status is at baseline.  Psychiatric:        Mood and Affect: Mood normal.        Behavior: Behavior normal.        Thought Content: Thought content normal.        Judgment: Judgment normal.      Lab Results:  CBC    Component Value Date/Time   WBC 7.7 07/23/2022 1018   RBC 4.46 07/23/2022 1018   HGB 13.4 07/23/2022 1018   HCT 39.7 07/23/2022 1018   PLT 209.0 07/23/2022 1018   MCV 88.9 07/23/2022 1018   MCH 30.3 06/23/2020 1032   MCHC 33.7 07/23/2022 1018   RDW 14.7 07/23/2022 1018   LYMPHSABS 2.6 07/23/2022 1018   MONOABS 0.5 07/23/2022 1018   EOSABS 0.0 07/23/2022 1018   BASOSABS 0.0 07/23/2022 1018    BMET    Component Value Date/Time   NA 137 07/23/2022 1018   K 3.9 07/23/2022 1018   CL 103 07/23/2022 1018   CO2 24 07/23/2022 1018   GLUCOSE 144 (H) 07/23/2022 1018   BUN 15 07/23/2022 1018   CREATININE 0.81 07/23/2022 1018   CREATININE 0.78 07/07/2020 1048   CALCIUM 10.0 07/23/2022 1018   GFRNONAA >60 05/10/2017 1030   GFRNONAA 84 11/12/2016 0821   GFRAA >60 05/10/2017 1030   GFRAA >89 11/12/2016 0821    BNP No results found for:  "BNP"  ProBNP No results found for: "PROBNP"  Imaging: No results found.   Assessment & Plan:   OSA on CPAP - HST 02/27/17>> AHI 14.8/hour. Maintained on auto CPAP, received new machine in April 2024. Patient is 97% compliant with CPAP use >4 hours. Current pressure 5-15cm h20; Residual AHI 0.1/hour. No changes today-renew CPAP supplies with DME (AirTouch F20 full face mask size M). Encourage patient continue to wear CPAP nightly 4-6 hours and work on weight loss. FU in 1 year or sooner if needed.   Earnstine Regal  Clent Ridges, NP 04/23/2023

## 2023-04-23 NOTE — Progress Notes (Signed)
Reviewed and agree with assessment/plan.   Lillie Bollig, MD [facility] 04/23/2023, 12:10 PM Pager:  [phone]  

## 2023-04-23 NOTE — Assessment & Plan Note (Signed)
-   HST 02/27/17>> AHI 14.8/hour. Maintained on auto CPAP, received new machine in April 2024. Patient is 97% compliant with CPAP use >4 hours. Current pressure 5-15cm h20; Residual AHI 0.1/hour. No changes today-renew CPAP supplies with DME (AirTouch F20 full face mask size M). Encourage patient continue to wear CPAP nightly 4-6 hours and work on weight loss. FU in 1 year or sooner if needed.

## 2023-05-10 ENCOUNTER — Other Ambulatory Visit: Payer: Self-pay

## 2023-06-25 ENCOUNTER — Other Ambulatory Visit (HOSPITAL_BASED_OUTPATIENT_CLINIC_OR_DEPARTMENT_OTHER): Payer: Self-pay

## 2023-06-25 ENCOUNTER — Other Ambulatory Visit: Payer: Self-pay | Admitting: Medical

## 2023-06-25 MED ORDER — LOSARTAN POTASSIUM-HCTZ 50-12.5 MG PO TABS
1.0000 | ORAL_TABLET | Freq: Every day | ORAL | 3 refills | Status: DC
  Filled 2023-06-25: qty 90, 90d supply, fill #0
  Filled 2023-09-24: qty 90, 90d supply, fill #1
  Filled 2023-12-23: qty 90, 90d supply, fill #2
  Filled 2024-03-20: qty 90, 90d supply, fill #3

## 2023-07-17 ENCOUNTER — Other Ambulatory Visit: Payer: Self-pay | Admitting: Medical

## 2023-07-26 ENCOUNTER — Ambulatory Visit (INDEPENDENT_AMBULATORY_CARE_PROVIDER_SITE_OTHER): Admitting: Medical

## 2023-07-26 VITALS — BP 136/82 | HR 81 | Temp 98.0°F | Resp 16 | Ht 63.0 in | Wt 202.0 lb

## 2023-07-26 DIAGNOSIS — R739 Hyperglycemia, unspecified: Secondary | ICD-10-CM

## 2023-07-26 DIAGNOSIS — Z23 Encounter for immunization: Secondary | ICD-10-CM | POA: Diagnosis not present

## 2023-07-26 DIAGNOSIS — Z7984 Long term (current) use of oral hypoglycemic drugs: Secondary | ICD-10-CM

## 2023-07-26 DIAGNOSIS — I8390 Asymptomatic varicose veins of unspecified lower extremity: Secondary | ICD-10-CM

## 2023-07-26 DIAGNOSIS — E1165 Type 2 diabetes mellitus with hyperglycemia: Secondary | ICD-10-CM | POA: Diagnosis not present

## 2023-07-26 DIAGNOSIS — Z0000 Encounter for general adult medical examination without abnormal findings: Secondary | ICD-10-CM

## 2023-07-26 DIAGNOSIS — M79609 Pain in unspecified limb: Secondary | ICD-10-CM | POA: Diagnosis not present

## 2023-07-26 LAB — COMPREHENSIVE METABOLIC PANEL
ALT: 43 U/L — ABNORMAL HIGH (ref 0–35)
AST: 34 U/L (ref 0–37)
Albumin: 4.7 g/dL (ref 3.5–5.2)
Alkaline Phosphatase: 56 U/L (ref 39–117)
BUN: 13 mg/dL (ref 6–23)
CO2: 27 meq/L (ref 19–32)
Calcium: 10.1 mg/dL (ref 8.4–10.5)
Chloride: 103 meq/L (ref 96–112)
Creatinine, Ser: 0.74 mg/dL (ref 0.40–1.20)
GFR: 90.34 mL/min (ref >60.00)
Glucose, Bld: 120 mg/dL — ABNORMAL HIGH (ref 70–99)
Potassium: 4.2 meq/L (ref 3.5–5.1)
Sodium: 141 meq/L (ref 135–145)
Total Bilirubin: 0.7 mg/dL (ref 0.2–1.2)
Total Protein: 7.2 g/dL (ref 6.0–8.3)

## 2023-07-26 LAB — CBC WITH DIFFERENTIAL/PLATELET
Basophils Absolute: 0.1 10*3/uL (ref 0.0–0.1)
Basophils Relative: 0.6 % (ref 0.0–3.0)
Eosinophils Absolute: 0.1 10*3/uL (ref 0.0–0.7)
Eosinophils Relative: 0.6 % (ref 0.0–5.0)
HCT: 41.8 % (ref 36.0–46.0)
Hemoglobin: 13.7 g/dL (ref 12.0–15.0)
Lymphocytes Relative: 35.5 % (ref 12.0–46.0)
Lymphs Abs: 3.2 10*3/uL (ref 0.7–4.0)
MCHC: 32.7 g/dL (ref 30.0–36.0)
MCV: 89.7 fL (ref 78.0–100.0)
Monocytes Absolute: 0.5 10*3/uL (ref 0.1–1.0)
Monocytes Relative: 5.6 % (ref 3.0–12.0)
Neutro Abs: 5.1 10*3/uL (ref 1.4–7.7)
Neutrophils Relative %: 57.7 % (ref 43.0–77.0)
Platelets: 235 10*3/uL (ref 150.0–400.0)
RBC: 4.67 Mil/uL (ref 3.87–5.11)
RDW: 14.4 % (ref 11.5–15.5)
WBC: 8.9 10*3/uL (ref 4.0–10.5)

## 2023-07-26 LAB — LIPID PANEL
Cholesterol: 89 mg/dL (ref 0–200)
HDL: 38.6 mg/dL — ABNORMAL LOW (ref >39.00)
LDL Cholesterol: 29 mg/dL (ref 0–99)
NonHDL: 50.59
Total CHOL/HDL Ratio: 2
Triglycerides: 110 mg/dL (ref 0.0–149.0)
VLDL: 22 mg/dL (ref 0.0–40.0)

## 2023-07-26 LAB — MICROALBUMIN / CREATININE URINE RATIO
Creatinine,U: 34.9 mg/dL
Microalb Creat Ratio: 2 mg/g (ref 0.0–30.0)
Microalb, Ur: <0.7 mg/dL (ref 0.0–1.9)

## 2023-07-26 LAB — HEMOGLOBIN A1C: Hgb A1c MFr Bld: 7.5 % — ABNORMAL HIGH (ref 4.6–6.5)

## 2023-07-26 NOTE — Patient Instructions (Addendum)
For you wellness exam today I have ordered cbc, cmp and  lipid panel  Vaccine flu today.  Recommend exercise and healthy diet.  We will let you know lab results as they come in.  Follow up date appointment will be determined after lab review.      Popliteal pain  - US Venous Img Lower Unilateral Left; Future  Varicose veins of calf - US Venous Img Lower Unilateral Left; Future - Ambulatory referral to Vascular Surgery   Elevated blood sugar  - Hemoglobin A1c - Urine Microalbumin w/creat. ratio  Type 2 diabetes mellitus with hyperglycemia, without long-term current use of insulin (HCC) -continue current diabetic meds. May make adjustment after review. - Urine Microalbumin w/creat. ratio   Follow up date to be determined after lab review.

## 2023-07-26 NOTE — Addendum Note (Signed)
Addended by: Kathi Ludwig on: 07/26/2023 11:12 AM   Modules accepted: Orders

## 2023-07-26 NOTE — Progress Notes (Signed)
Subjective:    Patient ID: Deborah Henderson, female    DOB: September 15, 1966, 57 y.o.   MRN: [id number]  HPI  Pt in for cpe/wellness. Has been one one year since last seen so decided to go ahead and address chronic med problems as well.   Pt has exercising on treadmill over past week/just started. Pt states she has been eating healthy and watching portions since diabetic. No smoker. No alcohol.    Pt states will get flu vaccine today.     Pt got flu vaccine today.   Pt will get covid vaccine/most recent.    Pt up to date colonoscopy done 03-28-2022. Mammogram done 10-31-2021.   Review of Systems  Constitutional:  Negative for chills, fatigue and fever.  HENT:  Negative for congestion, drooling and ear pain.   Respiratory:  Negative for cough, chest tightness, shortness of breath and wheezing.   Cardiovascular:  Negative for chest pain and palpitations.  Gastrointestinal:  Negative for abdominal pain, constipation and diarrhea.  Genitourinary:  Negative for dyspareunia, enuresis and frequency.  Musculoskeletal:  Negative for back pain and joint swelling.  Skin:  Negative for rash and wound.  Neurological:  Negative for facial asymmetry, light-headedness and numbness.       Foot burn sensation to left foot.  Hematological:  Negative for adenopathy. Does not bruise/bleed easily.  Psychiatric/Behavioral:  Negative for behavioral problems and hallucinations.    Past Medical History:  Diagnosis Date   Diabetes mellitus (HCC)    type 2   Diverticulosis    Hyperlipidemia    Hypertension    Hypothyroidism    Obesity    Sleep apnea    Currently on CPAP machine     Social History   Socioeconomic History   Marital status: Married    Spouse name: Not on file   Number of children: 2   Years of education: Not on file   Highest education level: Not on file  Occupational History   Occupation: Housewife   Tobacco Use   Smoking status: Former    Current packs/day: 0.00    Types:  Cigarettes    Quit date: 11/09/1989    Years since quitting: 33.7   Smokeless tobacco: Never   Tobacco comments:    Quit >20 years ago  Vaping Use   Vaping status: Never Used  Substance and Sexual Activity   Alcohol use: Not Currently   Drug use: No   Sexual activity: Yes    Partners: Male    Birth control/protection: Post-menopausal  Other Topics Concern   Not on file  Social History Narrative   Not on file   Social Determinants of Health   Financial Resource Strain: Not on file  Food Insecurity: Not on file  Transportation Needs: Not on file  Physical Activity: Not on file  Stress: Not on file  Social Connections: Unknown (03/06/2022)   Received from [facility]   Social Network    Social Network: Not on file  Intimate Partner Violence: Unknown (01/26/2022)   Received from [facility]   HITS    Physically Hurt: Not on file    Insult or Talk Down To: Not on file    Threaten Physical Harm: Not on file    Scream or Curse: Not on file    Past Surgical History:  Procedure Laterality Date   CESAREAN SECTION     x2   CHOLECYSTECTOMY     CHOLECYSTECTOMY, LAPAROSCOPIC  06/02/2015  [place]   COLONOSCOPY  02/04/2015   due 2021 per patient    ECTOPIC PREGNANCY SURGERY     ESOPHAGOGASTRODUODENOSCOPY  2015   OVARIAN CYST SURGERY  1988   Left   ROBOTIC ASSISTED BILATERAL SALPINGO OOPHERECTOMY N/A 05/21/2017   Procedure: XI ROBOTIC ASSIST  LEFT SALPINGO OOPHORECTOMY, LYSIS OF ADHESIONS;  Surgeon: Cleda Mccreedy, MD;  Location: [facility];  Service: Gynecology;  Laterality: N/A;    Family History  Problem Relation Age of Onset   Colon cancer Father    Cancer Father        colon   Diabetes Father    Prostate cancer Father    Cancer Brother        bone    Diabetes Brother    Diabetes Brother    Esophageal cancer Neg Hx    Stomach cancer Neg Hx    Rectal cancer Neg Hx    Colon polyps Neg Hx     Allergies  Allergen Reactions   Asa  [Aspirin] Anaphylaxis and Rash    Current Outpatient Medications on File Prior to Visit  Medication Sig Dispense Refill   glipiZIDE (GLUCOTROL) 10 MG tablet Take 1 tablet (10 mg total) by mouth 2 (two) times daily before a meal. 180 tablet 3   levothyroxine (SYNTHROID) 25 MCG tablet Take 1 tablet (25 mcg total) by mouth daily before breakfast. 30 tablet 11   losartan-hydrochlorothiazide (HYZAAR) 50-12.5 MG tablet TAKE 1 TABLET BY MOUTH DAILY. 90 tablet 3   metFORMIN (GLUCOPHAGE) 1000 MG tablet Take 1 tablet (1,000 mg total) by mouth 2 (two) times daily with a meal. 180 tablet 3   Multiple Vitamin (MULTIVITAMIN PO) Take by mouth.     polyethylene glycol powder (GLYCOLAX/MIRALAX) 17 GM/SCOOP powder Take 17 g by mouth 2 (two) times daily as needed. (Patient taking differently: Take 17 g by mouth as needed.) 3350 g 1   rosuvastatin (CRESTOR) 10 MG tablet TAKE 1 TABLET BY MOUTH DAILY 90 tablet 3   No current facility-administered medications on file prior to visit.    BP 136/82 (BP Location: Left Arm, Patient Position: Sitting, Cuff Size: Normal)   Pulse 81   Temp 98 F (36.7 C) (Oral)   Resp 16   Ht 5\' 3"  (1.6 m)   Wt 202 lb (91.6 kg)   SpO2 98%   BMI 35.78 kg/m        Objective:   Physical Exam General Mental Status- Alert. General Appearance- Not in acute distress.   Skin General: Color- Normal Color. Moisture- Normal Moisture.  Neck Carotid Arteries- Normal color. Moisture- Normal Moisture. No carotid bruits. No JVD.  Chest and Lung Exam Auscultation: Breath Sounds:-Normal.  Cardiovascular Auscultation:Rythm- Regular. Murmurs & Other Heart Sounds:Auscultation of the heart reveals- No Murmurs.  Abdomen Inspection:-Inspeection Normal. Palpation/Percussion:Note:No mass. Palpation and Percussion of the abdomen reveal- Non Tender, Non Distended + BS, no rebound or guarding.   Neurologic Cranial Nerve exam:- CN III-XII intact(No nystagmus), symmetric smile. Strength:-  5/5 equal and symmetric strength both upper and lower extremities.    Bilateral lower ext- calfs symmetric. Left side popliteal area mild pain on palpation. Varicose veins left calf. Mld tender/pain per pt.     Assessment & Plan:   Patient Instructions  For you wellness exam today I have ordered cbc, cmp and  lipid panel  Vaccine flu today.  Recommend exercise and healthy diet.  We will let you know lab results as they come in.  Follow up date appointment  will be determined after lab review.      Popliteal pain  - US Venous Img Lower Unilateral Left; Future  Varicose veins of calf - US Venous Img Lower Unilateral Left; Future - Ambulatory referral to Vascular Surgery   Elevated blood sugar  - Hemoglobin A1c - Urine Microalbumin w/creat. ratio  Type 2 diabetes mellitus with hyperglycemia, without long-term current use of insulin (HCC) -continue current diabetic meds. May make adjustment after review. - Urine Microalbumin w/creat. ratio   Follow up date to be determined after lab review.    Es[place]anza Rich[place], [place]  [id number] charge as did address poplieal pain, varicose vein and diabetes which on review looks like has not had A1c since 2022. [place] lower ext placed and rerferral to vascular surgeon  [phone]

## 2023-07-27 MED ORDER — DAPAGLIFLOZIN PROPANEDIOL 5 MG PO TABS
5.0000 mg | ORAL_TABLET | Freq: Every day | ORAL | 3 refills | Status: DC
  Filled 2023-07-27: qty 30, 30d supply, fill #0

## 2023-07-27 NOTE — Addendum Note (Signed)
Addended by: Gwenevere Abbot on: 07/27/2023 04:24 PM   Modules accepted: Orders

## 2023-07-28 ENCOUNTER — Other Ambulatory Visit (HOSPITAL_BASED_OUTPATIENT_CLINIC_OR_DEPARTMENT_OTHER): Payer: Self-pay

## 2023-07-29 ENCOUNTER — Ambulatory Visit (HOSPITAL_BASED_OUTPATIENT_CLINIC_OR_DEPARTMENT_OTHER)
Admission: RE | Admit: 2023-07-29 | Discharge: 2023-07-29 | Disposition: A | Discharge status: Home / Self Care | Source: Ambulatory Visit | Attending: Medical | Admitting: Medical

## 2023-07-29 ENCOUNTER — Other Ambulatory Visit (HOSPITAL_BASED_OUTPATIENT_CLINIC_OR_DEPARTMENT_OTHER): Payer: Self-pay

## 2023-07-29 DIAGNOSIS — I8390 Asymptomatic varicose veins of unspecified lower extremity: Secondary | ICD-10-CM | POA: Diagnosis present

## 2023-07-29 DIAGNOSIS — M79609 Pain in unspecified limb: Secondary | ICD-10-CM | POA: Diagnosis present

## 2023-08-08 ENCOUNTER — Other Ambulatory Visit (HOSPITAL_BASED_OUTPATIENT_CLINIC_OR_DEPARTMENT_OTHER): Payer: Self-pay

## 2023-08-20 ENCOUNTER — Other Ambulatory Visit: Payer: Self-pay | Admitting: Medical

## 2023-08-21 ENCOUNTER — Other Ambulatory Visit (HOSPITAL_BASED_OUTPATIENT_CLINIC_OR_DEPARTMENT_OTHER): Payer: Self-pay

## 2023-08-21 ENCOUNTER — Other Ambulatory Visit: Payer: Self-pay

## 2023-08-21 MED ORDER — GLIPIZIDE 10 MG PO TABS
10.0000 mg | ORAL_TABLET | Freq: Two times a day (BID) | ORAL | 3 refills | Status: DC
  Filled 2023-08-21: qty 180, 90d supply, fill #0
  Filled 2023-11-20: qty 180, 90d supply, fill #1
  Filled 2024-02-17: qty 180, 90d supply, fill #2
  Filled 2024-06-02: qty 180, 90d supply, fill #3

## 2023-08-26 ENCOUNTER — Other Ambulatory Visit: Payer: Self-pay | Admitting: *Deleted

## 2023-08-26 DIAGNOSIS — I8393 Asymptomatic varicose veins of bilateral lower extremities: Secondary | ICD-10-CM

## 2023-09-02 ENCOUNTER — Ambulatory Visit (HOSPITAL_COMMUNITY)
Admission: RE | Admit: 2023-09-02 | Discharge: 2023-09-02 | Disposition: A | Discharge status: Home / Self Care | Source: Ambulatory Visit | Attending: Surgery | Admitting: Surgery

## 2023-09-02 ENCOUNTER — Ambulatory Visit (INDEPENDENT_AMBULATORY_CARE_PROVIDER_SITE_OTHER): Admitting: Physician Assistant

## 2023-09-02 VITALS — BP 122/82 | HR 75 | Temp 98.2°F | Ht 63.0 in | Wt 206.2 lb

## 2023-09-02 DIAGNOSIS — I83899 Varicose veins of unspecified lower extremities with other complications: Secondary | ICD-10-CM

## 2023-09-02 DIAGNOSIS — I8393 Asymptomatic varicose veins of bilateral lower extremities: Secondary | ICD-10-CM | POA: Diagnosis present

## 2023-09-02 DIAGNOSIS — M7989 Other specified soft tissue disorders: Secondary | ICD-10-CM

## 2023-09-02 DIAGNOSIS — I872 Venous insufficiency (chronic) (peripheral): Secondary | ICD-10-CM

## 2023-09-02 NOTE — Progress Notes (Signed)
Requested by:  Esperanza Richters, PA-C [id number] [place],  [place] [address]  Reason for consultation: painful veins    History of Present Illness   Deborah Henderson is a 57 y.o. (1966/01/30) female who presents for evaluation of symptomatic varicose and reticular veins.  Over the past 6 months, she has noticed the development of several small reticular veins and a small varicose vein around both of her thighs.  These veins will cause a burning/stabbing pain usually at night.  She also endorses bilateral ankle edema occasionally.  Her swelling and stabbing pains are usually worse after long days of standing. Usually the pain in her left thigh varicose vein is more bothersome.  She denies any itching, bleeding, or ulcerations.  She has not tried leg elevation or compression stockings.  She denies any previous history of vein procedures or DVT.  Past Medical History:  Diagnosis Date   Diabetes mellitus (HCC)    type 2   Diverticulosis    Hyperlipidemia    Hypertension    Hypothyroidism    Obesity    Peripheral vascular disease (HCC)    Sleep apnea    Currently on CPAP machine    Past Surgical History:  Procedure Laterality Date   CESAREAN SECTION     x2   CHOLECYSTECTOMY     CHOLECYSTECTOMY, [place]OSCOPIC  06[place]02/[place]   COLONOSCOPY  02/04/2015   due 2021 per patient    ECTOPIC PREGNANCY SURGERY     ESOPHAGOGASTRODUODENOSCOPY  2015   OVARIAN CYST SURGERY  1988   Left   ROBOTIC ASSISTED BILATERAL SALPINGO OOPHERECTOMY N/A 05/21/2017   Procedure: XI ROBOTIC ASSIST  LEFT SALPINGO OOPHORECTOMY, LYSIS OF ADHESIONS;  Surgeon: Cleda Mccreedy, MD;  Location: [facility];  Service: Gynecology;  Laterality: N/A;    Social History   Socioeconomic History   Marital status: Married    Spouse name: Not on file   Number of children: 2   Years of education: Not on file   Highest education level: Not on file  Occupational History   Occupation: Housewife    Tobacco Use   Smoking status: Former    Current packs/day: 0.00    Types: Cigarettes    Quit date: 11/09/1989    Years since quitting: 33.8   Smokeless tobacco: Never   Tobacco comments:    Quit >20 years ago  Vaping Use   Vaping status: Never Used  Substance and Sexual Activity   Alcohol use: Not Currently   Drug use: No   Sexual activity: Yes    Partners: Male    Birth control/protection: Post-menopausal  Other Topics Concern   Not on file  Social History Narrative   Not on file   Social Determinants of Health   Financial Resource Strain: Not on file  Food Insecurity: Not on file  Transportation Needs: Not on file  Physical Activity: Not on file  Stress: Not on file  Social Connections: Unknown (03/06/2022)   Received [facility]   Social Network    Social Network: Not on file  Intimate Partner Violence: Unknown (01/26/2022)   Received from [facility], [facility]   HITS    Physically Hurt: Not on file    Insult or Talk Down To: Not on file    Threaten Physical Harm: Not on file    Scream or Curse: Not on file    Family History  Problem Relation Age of Onset  Colon cancer Father    Cancer Father        colon   Diabetes Father    Prostate cancer Father    Cancer Brother        bone    Diabetes Brother    Diabetes Brother    Esophageal cancer Neg Hx    Stomach cancer Neg Hx    Rectal cancer Neg Hx    Colon polyps Neg Hx     Current Outpatient Medications  Medication Sig Dispense Refill   Biotin 5000 MCG CAPS Take by mouth.     glipiZIDE (GLUCOTROL) 10 MG tablet Take 1 tablet (10 mg total) by mouth 2 (two) times daily before a meal. 180 tablet 3   levothyroxine (SYNTHROID) 25 MCG tablet Take 1 tablet (25 mcg total) by mouth daily before breakfast. 30 tablet 11   losartan-hydrochlorothiazide (HYZAAR) 50-12.5 MG tablet TAKE 1 TABLET BY MOUTH DAILY. 90 tablet 3   metFORMIN (GLUCOPHAGE) 1000 MG tablet Take 1 tablet (1,000 mg total)  by mouth 2 (two) times daily with a meal. 180 tablet 3   Multiple Vitamin (MULTIVITAMIN PO) Take by mouth.     polyethylene glycol powder (GLYCOLAX/MIRALAX) 17 GM/SCOOP powder Take 17 g by mouth 2 (two) times daily as needed. (Patient taking differently: Take 17 g by mouth as needed.) 3350 g 1   rosuvastatin (CRESTOR) 10 MG tablet TAKE 1 TABLET BY MOUTH DAILY 90 tablet 3   dapagliflozin propanediol (FARXIGA) 5 MG TABS tablet Take 1 tablet (5 mg total) by mouth daily before breakfast. (Patient not taking: Reported on 09/02/2023) 90 tablet 3   No current facility-administered medications for this visit.    Allergies  Allergen Reactions   Asa [Aspirin] Anaphylaxis and Rash    REVIEW OF SYSTEMS (negative unless checked):   Cardiac:  []  Chest pain or chest pressure? []  Shortness of breath upon activity? []  Shortness of breath when lying flat? []  Irregular heart rhythm?  Vascular:  []  Pain in calf, thigh, or hip brought on by walking? []  Pain in feet at night that wakes you up from your sleep? []  Blood clot in your veins? [x]  Leg swelling?  Pulmonary:  []  Oxygen at home? []  Productive cough? []  Wheezing?  Neurologic:  []  Sudden weakness in arms or legs? []  Sudden numbness in arms or legs? []  Sudden onset of difficult speaking or slurred speech? []  Temporary loss of vision in one eye? []  Problems with dizziness?  Gastrointestinal:  []  Blood in stool? []  Vomited blood?  Genitourinary:  []  Burning when urinating? []  Blood in urine?  Psychiatric:  []  Major depression  Hematologic:  []  Bleeding problems? []  Problems with blood clotting?  Dermatologic:  []  Rashes or ulcers?  Constitutional:  []  Fever or chills?  Ear/Nose/Throat:  []  Change in hearing? []  Nose bleeds? []  Sore throat?  Musculoskeletal:  []  Back pain? []  Joint pain? []  Muscle pain?   Physical Examination     Vitals:   09/02/23 1002  BP: 122/82  Pulse: 75  Temp: 98.2 F (36.8 C)   TempSrc: Temporal  SpO2: 94%  Weight: 206 lb 3.2 oz (93.5 kg)  Height: 5\' 3"  (1.6 m)   Body mass index is 36.53 kg/m.  General:  WDWN in NAD; vital signs documented above Gait: Not observed HENT: WNL, normocephalic Pulmonary: normal non-labored breathing , without Rales, rhonchi,  wheezing Cardiac: regular Abdomen: soft, NT, no masses Skin: without rashes Vascular Exam/Pulses: Palpable DP/PT pulses bilaterally Extremities: Several small reticular veins  on both thighs.  1 small varicose vein around the lateral, distal left thigh.  No significant edema.  No stasis pigmentation or ulcerations  Musculoskeletal: no muscle wasting or atrophy  Neurologic: A&O X 3;  No focal weakness or paresthesias are detected Psychiatric:  The pt has Normal affect.  Non-invasive Vascular Imaging   LLE Venous Insufficiency Duplex (09/02/2023):  +--------------+---------+------+-----------+------------+--------+  LEFT         Reflux NoRefluxReflux TimeDiameter cmsComments                          Yes                                   +--------------+---------+------+-----------+------------+--------+  CFV                    yes   >1 second                       +--------------+---------+------+-----------+------------+--------+  FV prox       no                                              +--------------+---------+------+-----------+------------+--------+  FV mid        no                                              +--------------+---------+------+-----------+------------+--------+  FV dist       no                                              +--------------+---------+------+-----------+------------+--------+  Popliteal              yes   >1 second                       +--------------+---------+------+-----------+------------+--------+  GSV at [facility]    no                           0.921               +--------------+---------+------+-----------+------------+--------+  GSV prox thighno                           0.315              +--------------+---------+------+-----------+------------+--------+  GSV mid thigh no                            0.36              +--------------+---------+------+-----------+------------+--------+  GSV dist thighno                           0.238              +--------------+---------+------+-----------+------------+--------+  GSV at knee   no  0.301              +--------------+---------+------+-----------+------------+--------+  GSV prox calf                              0.224              +--------------+---------+------+-----------+------------+--------+  SSV Pop Fossa no                           0.279              +--------------+---------+------+-----------+------------+--------+  SSV prox calf no                           0.192              +--------------+---------+------+-----------+------------+--------+  SSV mid calf  no                           0.224              +--------------+---------+------+-----------+------------+--------+    Medical Decision Making   Brylen Koupal is a 57 y.o. female who presents for evaluation of symptomatic varicose/reticular veins  Based on the patient's duplex, there is reflux in the left common femoral vein and popliteal vein.  The remainder of the deep and superficial venous system is competent.  Since she has no saphenous vein reflux, she would not be a candidate for saphenous vein ablation The patient has a several month history of elevated stabbing/burning pains in her reticular and varicose veins.  These are located around both of her distal thighs.  The pain is worse in the left leg.  Her pain is the worst at the end of the day.  Her pain is aggravated by prolonged standing I explained to the patient that she does have  evidence of venous insufficiency in her left lower extremity.  This can contribute to the development of varicose and reticular veins. I have offered sclerotherapy for the patient, however she does not like needles.  She has not attempted elevation or compression yet.  I have encouraged her to exercise regularly, wear compression stockings daily, elevate her legs, and avoid prolonged sitting and standing.  All of these conservative therapies to help with her symptomatic pains and mild ankle swelling. She was measured for and given a pair of knee-high and thigh-high compression stockings She can follow-up with our office as needed  Ernestene Mention, PA-C [facility] Office: [phone]  09/02/2023, 10:08 AM  Clinic MD: Myra Gianotti

## 2023-10-29 ENCOUNTER — Encounter: Payer: Self-pay | Admitting: Medical

## 2023-10-29 ENCOUNTER — Ambulatory Visit (INDEPENDENT_AMBULATORY_CARE_PROVIDER_SITE_OTHER): Admitting: Medical

## 2023-10-29 VITALS — BP 130/80 | HR 82 | Temp 98.1°F | Resp 18 | Ht 63.0 in | Wt 201.5 lb

## 2023-10-29 DIAGNOSIS — E1165 Type 2 diabetes mellitus with hyperglycemia: Secondary | ICD-10-CM

## 2023-10-29 DIAGNOSIS — G629 Polyneuropathy, unspecified: Secondary | ICD-10-CM | POA: Diagnosis not present

## 2023-10-29 DIAGNOSIS — E039 Hypothyroidism, unspecified: Secondary | ICD-10-CM

## 2023-10-29 DIAGNOSIS — Z7984 Long term (current) use of oral hypoglycemic drugs: Secondary | ICD-10-CM | POA: Diagnosis not present

## 2023-10-29 LAB — COMPREHENSIVE METABOLIC PANEL
ALT: 31 U/L (ref 0–35)
AST: 27 U/L (ref 0–37)
Albumin: 4.7 g/dL (ref 3.5–5.2)
Alkaline Phosphatase: 60 U/L (ref 39–117)
BUN: 20 mg/dL (ref 6–23)
CO2: 25 meq/L (ref 19–32)
Calcium: 10.4 mg/dL (ref 8.4–10.5)
Chloride: 104 meq/L (ref 96–112)
Creatinine, Ser: 0.73 mg/dL (ref 0.40–1.20)
GFR: 91.66 mL/min (ref >60.00)
Glucose, Bld: 80 mg/dL (ref 70–99)
Potassium: 3.8 meq/L (ref 3.5–5.1)
Sodium: 140 meq/L (ref 135–145)
Total Bilirubin: 0.4 mg/dL (ref 0.2–1.2)
Total Protein: 7 g/dL (ref 6.0–8.3)

## 2023-10-29 LAB — TSH: TSH: 2.31 u[IU]/mL (ref 0.35–5.50)

## 2023-10-29 LAB — T4, FREE: Free T4: 2.68 ng/dL — ABNORMAL HIGH (ref 0.60–1.60)

## 2023-10-29 LAB — VITAMIN B12: Vitamin B-12: 390 pg/mL (ref 211–911)

## 2023-10-29 LAB — HEMOGLOBIN A1C: Hgb A1c MFr Bld: 6.8 % — ABNORMAL HIGH (ref 4.6–6.5)

## 2023-10-29 NOTE — Progress Notes (Signed)
   Subjective:    Patient ID: Deborah Henderson, female    DOB: [birth year], 58 y.o.   MRN: [id number]  HPI Discussed the use of AI scribe software for clinical note transcription with the patient, who gave verbal consent to proceed.  History of Present Illness   The patient, with a history of diabetes, hypertension, and hyperlipidemia, reports difficulty affording Farxiga , a prescribed antidiabetic medication, due to its high cost. They are currently on Glucotrol  10mg  and Metformin  1000mg  twice daily. Their last HbA1c was 7.5, slightly above the target range.  The patient also reports symptoms of neuropathy in both feet, which they believe may be due to a vitamin B12 deficiency. They have been using compression socks, which have significantly alleviated their discomfort.  They also mention challenges with dietary control during the holiday season, which may have impacted their diabetes management.  Lastly, the patient mentions that they have been taking Losartan  and Hydrochlorothiazide  for hypertension and Crestor  for hyperlipidemia. They have been trying to maintain an active lifestyle, including walking during warmer months.        Review of Systems See hpi    Objective:   Physical Exam  General Mental Status- Alert. General Appearance- Not in acute distress.   Skin General: Color- Normal Color. Moisture- Normal Moisture.  Neck Carotid Arteries- Normal color. Moisture- Normal Moisture. No carotid bruits. No JVD.  Chest and Lung Exam Auscultation: Breath Sounds:-Normal.  Cardiovascular Auscultation:Rythm- Regular. Murmurs & Other Heart Sounds:Auscultation of the heart reveals- No Murmurs.  Abdomen Inspection:-Inspeection Normal. Palpation/Percussion:Note:No mass. Palpation and Percussion of the abdomen reveal- Non Tender, Non Distended + BS, no rebound or guarding.   Neurologic Cranial Nerve exam:- CN III-XII intact(No nystagmus), symmetric smile. Strength:- 5/5  equal and symmetric strength both upper and lower extremities.       Assessment & Plan:   Assessment and Plan    Type 2 Diabetes Mellitus Last A1c was 7.5 three months ago. Patient reports difficulty affording Farxiga . Currently on Glucotrol  10mg  and Metformin  1000mg  twice daily. -Repeat A1c today. -Consider alternative, more affordable medication options. -continue crestor  as advised.  Peripheral Neuropathy(likely diabetic but will check b12 as may be a factor) Patient reports improvement in symptoms with compression socks. Query regarding possible Vitamin B12 deficiency. -Order Vitamin B12 level to assess for deficiency.  Hypothyroidism Last TSH and T4 checked one year ago. -Order TSH and T4 today.  Hypertension Blood pressure 130/80. Patient is on Losartan /Hydrochlorothiazide  50/12.5mg  daily. -Order metabolic panel to check kidney and liver function.  General Health Maintenance / Follow up Plans -Return in three months for follow-up, sooner if necessary.        Deborah Nichelson, PA-C

## 2023-10-29 NOTE — Patient Instructions (Signed)
 Type 2 Diabetes Mellitus Last A1c was 7.5 three months ago. Patient reports difficulty affording Farxiga . Currently on Glucotrol  10mg  and Metformin  1000mg  twice daily. -Repeat A1c today. -Consider alternative, more affordable medication options. -continue crestor  as advised.  Peripheral Neuropathy(likely diabetic but will check b12 as may be a factor) Patient reports improvement in symptoms with compression socks. Query regarding possible Vitamin B12 deficiency. -Order Vitamin B12 level to assess for deficiency.  Hypothyroidism Last TSH and T4 checked one year ago. -Order TSH and T4 today.  Hypertension Blood pressure 130/80. Patient is on Losartan /Hydrochlorothiazide  50/12.5mg  daily. -Order metabolic panel to check kidney and liver function.  General Health Maintenance / Follow up Plans -Return in three months for follow-up, sooner if necessary.

## 2023-11-01 ENCOUNTER — Other Ambulatory Visit (HOSPITAL_BASED_OUTPATIENT_CLINIC_OR_DEPARTMENT_OTHER): Payer: Self-pay

## 2023-11-01 ENCOUNTER — Encounter: Payer: Self-pay | Admitting: Obstetrics and Gynecology

## 2023-11-01 ENCOUNTER — Other Ambulatory Visit (HOSPITAL_COMMUNITY)
Admission: RE | Admit: 2023-11-01 | Discharge: 2023-11-01 | Disposition: A | Discharge status: Home / Self Care | Source: Ambulatory Visit | Attending: Obstetrics and Gynecology | Admitting: Obstetrics and Gynecology

## 2023-11-01 ENCOUNTER — Ambulatory Visit (INDEPENDENT_AMBULATORY_CARE_PROVIDER_SITE_OTHER): Admitting: Obstetrics and Gynecology

## 2023-11-01 ENCOUNTER — Other Ambulatory Visit: Payer: Self-pay

## 2023-11-01 VITALS — BP 134/87 | HR 92 | Ht 63.0 in | Wt 202.0 lb

## 2023-11-01 DIAGNOSIS — Z1331 Encounter for screening for depression: Secondary | ICD-10-CM

## 2023-11-01 DIAGNOSIS — Z1151 Encounter for screening for human papillomavirus (HPV): Secondary | ICD-10-CM | POA: Diagnosis not present

## 2023-11-01 DIAGNOSIS — N958 Other specified menopausal and perimenopausal disorders: Secondary | ICD-10-CM

## 2023-11-01 DIAGNOSIS — Z01419 Encounter for gynecological examination (general) (routine) without abnormal findings: Secondary | ICD-10-CM

## 2023-11-01 MED ORDER — ESTRADIOL 0.1 MG/GM VA CREA
TOPICAL_CREAM | VAGINAL | 12 refills | Status: DC
Start: 2023-11-01 — End: 2024-04-22
  Filled 2023-11-01: qty 42.5, 77d supply, fill #0

## 2023-11-01 MED ORDER — ESTRADIOL 0.1 MG/GM VA CREA: TOPICAL_CREAM | VAGINAL | 12 refills | Status: DC

## 2023-11-01 MED ORDER — ESTRADIOL 0.1 MG/GM VA CREA
TOPICAL_CREAM | VAGINAL | 12 refills | Status: DC
Start: 2023-11-01 — End: 2023-11-01

## 2023-11-01 NOTE — Progress Notes (Signed)
 ANNUAL EXAM Patient name: Deborah Henderson MRN [id number]  Date of birth: Jan 26, 1966 Chief Complaint:   New Patient (Initial Visit)  History of Present Illness:   Deborah Henderson is a 58 y.o. [id number] being seen today for a routine annual exam.  Current complaints: annual   Menstrual concerns? No  2016 lmp; vaginal dryness present using coconut oil and working  Breast or nipple changes? No  Contraception use? Yes/menopause Sexually active? Yes pain with intercourse; using coconut oil, sometimes experiences pain due to dryness. Uses coconut oil for daily moisture and lubrication for intercourse  No LMP recorded. Patient is postmenopausal.   Upstream - 11/01/23 1037       Pregnancy Intention Screening   Does the patient want to become pregnant in the next year? No    Does the patient's partner want to become pregnant in the next year? No    Would the patient like to discuss contraceptive options today? N/A      Contraception Wrap Up   Current Method No Method - Other Reason    Reason for No Current Contraceptive Method at Intake ([facility] Only) Other            The pregnancy intention screening data noted above was reviewed. Potential methods of contraception were discussed. The patient elected to proceed with No data recorded.   Last pap No results found for: DIAGPAP, HPVHIGH, ADEQPAP Last mammogram: 10/2022 [facility] 1.  Last colonoscopy: 2023.      11/01/2023   10:30 AM 07/26/2023    9:58 AM 07/23/2022    9:20 AM 07/14/2021    8:01 AM 10/29/2019    8:57 AM  Depression screen PHQ 2/9  Decreased Interest 0  0 0 0  Down, Depressed, Hopeless 0 0 0 0 0  PHQ - 2 Score 0 0 0 0 0  Altered sleeping 0 0   0  Tired, decreased energy 0 0   1  Change in appetite 0 0   0  Feeling bad or failure about yourself  0 0   0  Trouble concentrating 0 0   0  Moving slowly or fidgety/restless 1 0   0  Suicidal thoughts 0 0   0  PHQ-9 Score 1 0   1  Difficult doing work/chores  Not  difficult at all           07/26/2023    9:59 AM  GAD 7 : Generalized Anxiety Score  Nervous, Anxious, on Edge 0  Control/stop worrying 0  Worry too much - different things 0  Trouble relaxing 0  Restless 0  Easily annoyed or irritable 0  Afraid - awful might happen 0  Total GAD 7 Score 0  Anxiety Difficulty Not difficult at all     Review of Systems:   Pertinent items are noted in HPI Denies any headaches, blurred vision, fatigue, shortness of breath, chest pain, abdominal pain, abnormal vaginal discharge/itching/odor/irritation, problems with periods, bowel movements, urination, or intercourse unless otherwise stated above. Pertinent History Reviewed:  Reviewed past medical,surgical, social and family history.  Reviewed problem list, medications and allergies. Physical Assessment:   Vitals:   11/01/23 1025  BP: 134/87  Pulse: 92  Weight: 202 lb (91.6 kg)  Height: 5' 3 (1.6 m)  Body mass index is 35.78 kg/m.        Physical Examination:   General appearance - well appearing, and in no distress  Mental status - alert, oriented to person, place, and time  Psych:  She has a normal mood and affect  Skin - warm and dry, normal color, no suspicious lesions noted  Chest - effort normal, all lung fields clear to auscultation bilaterally  Heart - normal rate and regular rhythm  Breasts - breasts appear normal, no suspicious masses, no skin or nipple changes or  axillary nodes  Abdomen - soft, nontender, nondistended, no masses or organomegaly  Pelvic -  VULVA: normal appearing vulva with no masses, tenderness or lesions   VAGINA: normal appearing vagina with normal color and discharge, no lesions   CERVIX: normal appearing cervix without discharge or lesions, no CMT  Thin prep pap is done with HR HPV cotesting  UTERUS: uterus is felt to be normal size, shape, consistency and nontender   ADNEXA: No adnexal masses or tenderness noted.  Extremities:  No swelling or  varicosities noted  Chaperone present for exam  No results found for this or any previous visit (from the past 24 hours).    Assessment & Plan:  1. Encounter for well woman exam with routine gynecological exam [Z01.419] (Primary) - Cervical cancer screening: Discussed guidelines. Pap with HPV collected - Breast Health: Encouraged self breast awareness/SBE. Discussed limits of clinical breast exam for detecting breast cancer. Discussed importance of annual MXR.    - Climacteric/Sexual health: Reviewed typical and atypical symptoms of menopause/peri-menopause. Discussed PMB and to call if any amount of spotting.  - Colonoscopy: Per PCP - F/U 12 months and prn  - Cytology - PAP  2. Genitourinary syndrome of menopause Continue use of coconut oil and on vaginal estrogen cream. Follow up response in 3-4 months  - estradiol  (ESTRACE ) 0.1 MG/GM vaginal cream; Apply 1 gram into the vagina every night for 2 weeks, then apply three times a week  Dispense: 30 g; Refill: 12  No orders of the defined types were placed in this encounter.   Meds:  Meds ordered this encounter  Medications   DISCONTD: estradiol  (ESTRACE ) 0.1 MG/GM vaginal cream    Sig: Apply 1 gram per vagina every night for 2 weeks, then apply three times a week    Dispense:  30 g    Refill:  12   estradiol  (ESTRACE ) 0.1 MG/GM vaginal cream    Sig: Apply 1 gram into the vagina every night for 2 weeks, then apply three times a week    Dispense:  30 g    Refill:  12    Follow-up: Return in about 3 months (around 01/30/2024) for GYN Follow Up.  Carter Quarry, MD 11/01/2023 10:50 AM

## 2023-11-03 LAB — VITAMIN B1: Vitamin B1 (Thiamine): 16 nmol/L (ref 8–30)

## 2023-11-04 ENCOUNTER — Ambulatory Visit: Admitting: Obstetrics and Gynecology

## 2023-11-04 ENCOUNTER — Other Ambulatory Visit: Payer: Self-pay

## 2023-11-04 ENCOUNTER — Other Ambulatory Visit (HOSPITAL_BASED_OUTPATIENT_CLINIC_OR_DEPARTMENT_OTHER): Payer: Self-pay

## 2023-11-04 MED ORDER — DAPAGLIFLOZIN PROPANEDIOL 5 MG PO TABS
5.0000 mg | ORAL_TABLET | Freq: Every day | ORAL | 3 refills | Status: DC
  Filled 2023-11-04: qty 30, 30d supply, fill #0

## 2023-11-05 LAB — CYTOLOGY - PAP
Adequacy: ABSENT
Comment: NEGATIVE
Diagnosis: NEGATIVE
High risk HPV: NEGATIVE

## 2024-01-27 ENCOUNTER — Other Ambulatory Visit (HOSPITAL_BASED_OUTPATIENT_CLINIC_OR_DEPARTMENT_OTHER): Payer: Self-pay | Admitting: Medical

## 2024-01-27 DIAGNOSIS — Z1231 Encounter for screening mammogram for malignant neoplasm of breast: Secondary | ICD-10-CM

## 2024-01-28 ENCOUNTER — Encounter (HOSPITAL_BASED_OUTPATIENT_CLINIC_OR_DEPARTMENT_OTHER): Payer: Self-pay

## 2024-01-28 ENCOUNTER — Ambulatory Visit (HOSPITAL_BASED_OUTPATIENT_CLINIC_OR_DEPARTMENT_OTHER)
Admission: RE | Admit: 2024-01-28 | Discharge: 2024-01-28 | Disposition: A | Discharge status: Home / Self Care | Source: Ambulatory Visit | Attending: Medical | Admitting: Medical

## 2024-01-28 DIAGNOSIS — Z1231 Encounter for screening mammogram for malignant neoplasm of breast: Secondary | ICD-10-CM | POA: Insufficient documentation

## 2024-02-13 ENCOUNTER — Other Ambulatory Visit (HOSPITAL_BASED_OUTPATIENT_CLINIC_OR_DEPARTMENT_OTHER): Payer: Self-pay

## 2024-02-13 ENCOUNTER — Other Ambulatory Visit: Payer: Self-pay | Admitting: Medical

## 2024-02-13 MED ORDER — METFORMIN HCL 1000 MG PO TABS
1000.0000 mg | ORAL_TABLET | Freq: Two times a day (BID) | ORAL | 0 refills | Status: DC
  Filled 2024-02-13: qty 180, 90d supply, fill #0

## 2024-03-30 ENCOUNTER — Other Ambulatory Visit: Payer: Self-pay | Admitting: Medical

## 2024-03-31 ENCOUNTER — Other Ambulatory Visit (HOSPITAL_BASED_OUTPATIENT_CLINIC_OR_DEPARTMENT_OTHER): Payer: Self-pay

## 2024-03-31 MED ORDER — LEVOTHYROXINE SODIUM 25 MCG PO TABS
25.0000 ug | ORAL_TABLET | Freq: Every day | ORAL | 11 refills | Status: AC
  Filled 2024-03-31: qty 30, 30d supply, fill #0
  Filled 2024-04-28: qty 30, 30d supply, fill #1
  Filled 2024-06-02: qty 30, 30d supply, fill #2
  Filled 2024-06-28: qty 30, 30d supply, fill #3
  Filled 2024-07-28: qty 30, 30d supply, fill #4
  Filled 2024-08-28: qty 30, 30d supply, fill #5
  Filled 2024-09-25: qty 30, 30d supply, fill #6
  Filled 2024-10-29: qty 30, 30d supply, fill #7
  Filled 2024-11-27: qty 30, 30d supply, fill #8

## 2024-04-01 ENCOUNTER — Other Ambulatory Visit (HOSPITAL_BASED_OUTPATIENT_CLINIC_OR_DEPARTMENT_OTHER): Payer: Self-pay

## 2024-04-21 ENCOUNTER — Telehealth: Payer: Self-pay

## 2024-04-21 NOTE — Telephone Encounter (Signed)
 Pt is scheduled to see Landry Ferrari, NP tomorrow, 04-22-24, for a follow up. I was unable to find compliance in [place]. I called [employer] and spoke to [place]. Powell states she was able to find updated compliance and she will fax a 90 day report to B pod and tag our office. I will await fax. NFN

## 2024-04-22 ENCOUNTER — Ambulatory Visit (INDEPENDENT_AMBULATORY_CARE_PROVIDER_SITE_OTHER): Admitting: Primary Care

## 2024-04-22 ENCOUNTER — Encounter: Payer: Self-pay | Admitting: Primary Care

## 2024-04-22 VITALS — BP 132/74 | HR 84 | Temp 97.3°F | Ht 64.0 in | Wt 210.4 lb

## 2024-04-22 DIAGNOSIS — Z87891 Personal history of nicotine dependence: Secondary | ICD-10-CM

## 2024-04-22 DIAGNOSIS — G4733 Obstructive sleep apnea (adult) (pediatric): Secondary | ICD-10-CM | POA: Diagnosis not present

## 2024-04-22 NOTE — Progress Notes (Addendum)
 @Patient  ID: Deborah Henderson, female    DOB: 1966-06-08, 58 y.o.   MRN: [id number]  Chief Complaint  Patient presents with   Follow-up    CPAP f/u.     Referring provider: Saguier, Edward, PA-C  HPI: 58 year old female, former smoker.  Past medical history significant for OSA on CPAP, hypersomnia, type 2 diabetes, multinodular goiter, dyslipidemia.  Previous [facility] pulmonary encounter:  01/16/2023 Patient presents today for sleep consult. Patient had home sleep study on 02/27/2017 that showed evidence of mild to moderate obstructive sleep apnea, average AHI 14.8 an hour with SpO2 low 77% (average 92%). She is currently on auto CPAP. She is compliant with use. She is sleeping well at night.  Typical bedtime is between 10 and 11 PM.  It takes her on average 30 minutes to fall asleep.  She wakes up twice a night.  She starts her day between 6 and 7 AM.  She does tend to fall asleep if inactive but denies excessive daytime sleepiness. She is needing to establish with a new DME company for CPAP supplies.  Her current machine is greater than 5 years and is due to be replaced.  Denies symptoms of narcolepsy, cataplexy or sleepwalking.  Airview download 12/16/2022 - 01/14/2023 Usage 30/30 days (100%) greater than 4 hours Average usage 7 hours 22 minutes Pressure 5 to 10 cm H2O (10.0 cm H2O-95%) Air leaks 15.4 L/min (95%) AHI 0.1  04/23/2023 Patient presents today for 3-4 month follow-up. She is doing well. Reports compliance with CPAP use. No issues with mask fit or pressure settings. She received new CPAP in April 2024. Needs order for CPAP supplies. She sleeps well at night and feels rested in the morning.   Airview download 03/24/23-04/22/23 Usage days 30/30 days; 97% > 4 hours Average usage 7 hours 0 mins Current pressure 5-10cm h20 (10cm h20-95%) Airleaks 22L/min (95%) AHI 0.1   04/22/2024- Interim hx  Discussed the use of AI scribe software for clinical note transcription with the patient,  who gave verbal consent to proceed.  History of Present Illness   Deborah Henderson is a 58 year old female with sleep apnea who presents for an annual follow-up.  She has a history of mild to moderate sleep apnea, diagnosed in 2018 with a sleep study showing an average of 14.8 apneic pauses per hour. She was started on CPAP therapy and has been compliant with its use, currently using her second CPAP machine. She uses the CPAP 100% of the time, and her most recent compliance report shows an apnea score of less than 5 per hour.  She reports significant improvement in her symptoms since starting CPAP therapy, noting that she no longer wakes up feeling 'very tired'. She typically gets about seven hours of sleep per night but occasionally wakes up to use the restroom. She experiences some dryness in her mouth, which she attributes to opening her mouth during sleep, but otherwise reports no issues with the CPAP pressure or mask.  Her weight has slightly increased from 202 pounds to 210 pounds since last year, although she notes that she was weighed with her shoes on. She continues to receive CPAP supplies by mail and changes them regularly as recommended: the mask cushion monthly, tubing every three months, filter monthly, headgear every six months, and water  chamber every six months.  No issues with CPAP pressure or mask fit, aside from occasional mouth dryness. She reports waking up to use the restroom at night but otherwise sleeps through the  night.     Airview download 01/22/24-04/20/24 Usage 90/90 days (100%) > 4 hours Average usage 7 hours 0 mins Pressure 5-10cm h20 (10cm h20-95%) Airleaks 11.6L/min (95%) AHI 0.2  Allergies  Allergen Reactions   Asa [Aspirin] Anaphylaxis and Rash    Immunization History  Administered Date(s) Administered   Influenza, High Dose Seasonal PF 07/17/2017   Influenza, Seasonal, Injecte, Preservative Fre 07/26/2023   Influenza,inj,Quad PF,6+ Mos 08/08/2018,  07/24/2019, 07/21/2020, 07/14/2021, 07/23/2022   Influenza-Unspecified 07/30/2016   PFIZER Comirnaty(Gray Top)Covid-19 Tri-Sucrose Vaccine 02/21/2021   PFIZER(Purple Top)SARS-COV-2 Vaccination 01/09/2020, 01/30/2020, 08/01/2020   Pneumococcal Conjugate-13 07/17/2017   Pneumococcal Polysaccharide-23 12/25/2017   Tdap 03/04/2017    Past Medical History:  Diagnosis Date   Diabetes mellitus (HCC)    type 2   Diverticulosis    Hyperlipidemia    Hypertension    Hypothyroidism    Obesity    Peripheral vascular disease (HCC)    Sleep apnea    Currently on CPAP machine    Tobacco History: Social History   Tobacco Use  Smoking Status Former   Current packs/day: 0.00   Types: Cigarettes   Quit date: 11/09/1989   Years since quitting: 34.4  Smokeless Tobacco Never  Tobacco Comments   Quit >20 years ago   Counseling given: Not Answered Tobacco comments: Quit >20 years ago   Outpatient Medications Prior to Visit  Medication Sig Dispense Refill   glipiZIDE  (GLUCOTROL ) 10 MG tablet Take 1 tablet (10 mg total) by mouth 2 (two) times daily before a meal. 180 tablet 3   levothyroxine  (SYNTHROID ) 25 MCG tablet Take 1 tablet (25 mcg total) by mouth daily before breakfast. 30 tablet 11   losartan -hydrochlorothiazide  (HYZAAR ) 50-12.5 MG tablet TAKE 1 TABLET BY MOUTH DAILY. 90 tablet 3   metFORMIN  (GLUCOPHAGE ) 1000 MG tablet Take 1 tablet (1,000 mg total) by mouth 2 (two) times daily with a meal. 180 tablet 0   Multiple Vitamin (MULTIVITAMIN PO) Take by mouth.     polyethylene glycol powder (GLYCOLAX /MIRALAX ) 17 GM/SCOOP powder Take 17 g by mouth 2 (two) times daily as needed. (Patient taking differently: Take 17 g by mouth as needed.) 3350 g 1   rosuvastatin  (CRESTOR ) 10 MG tablet TAKE 1 TABLET BY MOUTH DAILY 90 tablet 3   Biotin 5000 MCG CAPS Take by mouth. (Patient not taking: Reported on 11/01/2023)     estradiol  (ESTRACE ) 0.1 MG/GM vaginal cream Apply 1 gram per vagina every night for 2  weeks, then apply three times a week (Patient not taking: Reported on 04/22/2024) 30 g 12   estradiol  (ESTRACE ) 0.1 MG/GM vaginal cream Apply 1 gram into the vagina every night for 2 weeks, then apply three times a week (Patient not taking: Reported on 04/22/2024) 30 g 12   No facility-administered medications prior to visit.    Review of Systems  Review of Systems  Constitutional: Negative.  Negative for fatigue.  HENT: Negative.    Respiratory: Negative.    Cardiovascular: Negative.   Psychiatric/Behavioral:  Negative for sleep disturbance.    Physical Exam  BP 132/74 (BP Location: Left Arm, Patient Position: Sitting, Cuff Size: Normal)   Pulse 84   Temp (!) 97.3 F (36.3 C) (Temporal)   Ht 5' 4 (1.626 m)   Wt 210 lb 6.4 oz (95.4 kg)   SpO2 95%   BMI 36.12 kg/m  Physical Exam Constitutional:      General: She is not in acute distress.    Appearance: Normal appearance. She is  not ill-appearing.  HENT:     Head: Normocephalic and atraumatic.     Mouth/Throat:     Mouth: Mucous membranes are moist.     Pharynx: Oropharynx is clear.  Cardiovascular:     Rate and Rhythm: Normal rate and regular rhythm.  Pulmonary:     Effort: Pulmonary effort is normal.     Breath sounds: Normal breath sounds.  Musculoskeletal:        General: Normal range of motion.  Skin:    General: Skin is warm and dry.  Neurological:     General: No focal deficit present.     Mental Status: She is alert and oriented to person, place, and time. Mental status is at baseline.  Psychiatric:        Mood and Affect: Mood normal.        Behavior: Behavior normal.        Thought Content: Thought content normal.        Judgment: Judgment normal.      Lab Results:  CBC    Component Value Date/Time   WBC 8.9 07/26/2023 1121   RBC 4.67 07/26/2023 1121   HGB 13.7 07/26/2023 1121   HCT 41.8 07/26/2023 1121   PLT 235.0 07/26/2023 1121   MCV 89.7 07/26/2023 1121   MCH 30.3 06/23/2020 1032   MCHC 32.7  07/26/2023 1121   RDW 14.4 07/26/2023 1121   LYMPHSABS 3.2 07/26/2023 1121   MONOABS 0.5 07/26/2023 1121   EOSABS 0.1 07/26/2023 1121   BASOSABS 0.1 07/26/2023 1121    BMET    Component Value Date/Time   NA 140 10/29/2023 0832   K 3.8 10/29/2023 0832   CL 104 10/29/2023 0832   CO2 25 10/29/2023 0832   GLUCOSE 80 10/29/2023 0832   BUN 20 10/29/2023 0832   CREATININE 0.73 10/29/2023 0832   CREATININE 0.78 07/07/2020 1048   CALCIUM  10.4 10/29/2023 0832   GFRNONAA >60 05/10/2017 1030   GFRNONAA 84 11/12/2016 0821   GFRAA >60 05/10/2017 1030   GFRAA >89 11/12/2016 0821    BNP No results found for: BNP  ProBNP No results found for: PROBNP  Imaging: No results found.   Assessment & Plan:   1. OSA on CPAP (Primary)  Assessment and Plan    Obstructive Sleep Apnea Obstructive sleep apnea is well-controlled with CPAP therapy, with 100% compliance and an apnea-hypopnea index of 0.5 per hour. She reports significant improvement in symptoms, including reduced fatigue upon waking. Occasional dry mouth occurs due to mouth opening during sleep. Weight is stable. - Renew CPAP supplies for 1 year. - Ensure CPAP supplies are mailed regularly and changed as per schedule: mask cushion monthly, tubing every three months, filter monthly, headgear every six months, and water  chamber every six months. - Advise to continue working on weight loss. - Instruct not to drive if feeling tired. - Schedule follow-up in one year.   Almarie LELON Ferrari, NP 04/22/2024

## 2024-04-22 NOTE — Patient Instructions (Addendum)
  VISIT SUMMARY: Today, we reviewed your history of sleep apnea and your current treatment with CPAP therapy. You have been using your CPAP machine consistently and have seen significant improvement in your symptoms, including reduced fatigue. We discussed your current weight and the importance of continuing your weight loss efforts. You also mentioned experiencing occasional dry mouth, which we attributed to mouth breathing during sleep.  YOUR PLAN: -OBSTRUCTIVE SLEEP APNEA: Obstructive sleep apnea is a condition where your breathing stops and starts repeatedly during sleep due to blocked airways. Your condition is well-controlled with CPAP therapy, and you are using your machine 100% of the time with good results. You should continue to receive and change your CPAP supplies as recommended: mask cushion monthly, tubing every three months, filter monthly, headgear every six months, and water  chamber every six months. Keep working on weight loss, and avoid driving if you feel tired.  INSTRUCTIONS: Please schedule a follow-up appointment in one year.   ______________________________________________________________________  LAUNIE DE LA VISITA: Hoy revisamos su historial de apnea del sueo y su tratamiento actual con terapia CPAP. Ha estado usando su mquina CPAP de forma constante y ha notado una mejora significativa en sus sntomas, incluyendo una reduccin de la fatiga. Hablamos sobre su peso actual y la importancia de Educational psychologist con sus esfuerzos para Publishing copy de [place]. Tambin mencion que experimenta sequedad bucal ocasional, que atribuimos a la respiracin bucal durante el sueo.  SU PLAN: -APNEA OBSTRUCTIVA DEL SUEO: La apnea obstructiva del sueo es una afeccin en la que la respiracin se detiene y se reinicia repetidamente durante el sueo debido a la obstruccin de las vas respiratorias. Su afeccin est bien controlada con la terapia CPAP y est usando su mquina el 100% del tiempo con [place]. Debe continuar recibiendo y cambiando sus suministros de CPAP segn las recomendaciones: almohadilla de Copywriter, advertising, tubo cada tres meses, filtro [place], arns cada seis meses y cmara de agua cada seis meses. Contine trabajando en la prdida de peso y evite conducir si se siente cansado.  INSTRUCCIONES: Por favor, programe una cita de seguimiento dentro de un ao.

## 2024-05-13 ENCOUNTER — Other Ambulatory Visit: Payer: Self-pay | Admitting: Medical

## 2024-05-13 ENCOUNTER — Other Ambulatory Visit (HOSPITAL_BASED_OUTPATIENT_CLINIC_OR_DEPARTMENT_OTHER): Payer: Self-pay

## 2024-05-13 MED ORDER — METFORMIN HCL 1000 MG PO TABS
1000.0000 mg | ORAL_TABLET | Freq: Two times a day (BID) | ORAL | 0 refills | Status: DC
  Filled 2024-05-13: qty 180, 90d supply, fill #0

## 2024-06-16 ENCOUNTER — Other Ambulatory Visit: Payer: Self-pay | Admitting: Medical

## 2024-06-17 ENCOUNTER — Other Ambulatory Visit (HOSPITAL_BASED_OUTPATIENT_CLINIC_OR_DEPARTMENT_OTHER): Payer: Self-pay

## 2024-06-17 MED ORDER — LOSARTAN POTASSIUM-HCTZ 50-12.5 MG PO TABS
1.0000 | ORAL_TABLET | Freq: Every day | ORAL | 3 refills | Status: AC
  Filled 2024-06-17: qty 90, 90d supply, fill #0
  Filled 2024-09-13: qty 90, 90d supply, fill #1

## 2024-07-29 ENCOUNTER — Ambulatory Visit: Admitting: Medical

## 2024-07-29 ENCOUNTER — Ambulatory Visit: Payer: Self-pay | Admitting: Medical

## 2024-07-29 VITALS — BP 120/80 | HR 66 | Temp 97.8°F | Resp 15 | Ht 64.0 in | Wt 200.2 lb

## 2024-07-29 DIAGNOSIS — Z7984 Long term (current) use of oral hypoglycemic drugs: Secondary | ICD-10-CM

## 2024-07-29 DIAGNOSIS — Z0000 Encounter for general adult medical examination without abnormal findings: Secondary | ICD-10-CM

## 2024-07-29 DIAGNOSIS — Z23 Encounter for immunization: Secondary | ICD-10-CM

## 2024-07-29 DIAGNOSIS — Z0184 Encounter for antibody response examination: Secondary | ICD-10-CM

## 2024-07-29 DIAGNOSIS — E1165 Type 2 diabetes mellitus with hyperglycemia: Secondary | ICD-10-CM | POA: Diagnosis not present

## 2024-07-29 LAB — CBC WITH DIFFERENTIAL/PLATELET
Basophils Absolute: 0 K/uL (ref 0.0–0.1)
Basophils Relative: 0.6 % (ref 0.0–3.0)
Eosinophils Absolute: 0.1 K/uL (ref 0.0–0.7)
Eosinophils Relative: 0.8 % (ref 0.0–5.0)
HCT: 40.6 % (ref 36.0–46.0)
Hemoglobin: 13.3 g/dL (ref 12.0–15.0)
Lymphocytes Relative: 31.9 % (ref 12.0–46.0)
Lymphs Abs: 2.6 K/uL (ref 0.7–4.0)
MCHC: 32.9 g/dL (ref 30.0–36.0)
MCV: 91.1 fl (ref 78.0–100.0)
Monocytes Absolute: 0.5 K/uL (ref 0.1–1.0)
Monocytes Relative: 6.1 % (ref 3.0–12.0)
Neutro Abs: 4.8 K/uL (ref 1.4–7.7)
Neutrophils Relative %: 60.6 % (ref 43.0–77.0)
Platelets: 214 K/uL (ref 150.0–400.0)
RBC: 4.46 Mil/uL (ref 3.87–5.11)
RDW: 14.5 % (ref 11.5–15.5)
WBC: 8 K/uL (ref 4.0–10.5)

## 2024-07-29 LAB — MICROALBUMIN / CREATININE URINE RATIO
Creatinine,U: 27.7 mg/dL
Microalb Creat Ratio: UNDETERMINED mg/g (ref 0.0–30.0)
Microalb, Ur: <0.7 mg/dL

## 2024-07-29 LAB — COMPREHENSIVE METABOLIC PANEL WITH GFR
ALT: 28 U/L (ref 0–35)
AST: 27 U/L (ref 0–37)
Albumin: 4.7 g/dL (ref 3.5–5.2)
Alkaline Phosphatase: 52 U/L (ref 39–117)
BUN: 17 mg/dL (ref 6–23)
CO2: 29 meq/L (ref 19–32)
Calcium: 9.8 mg/dL (ref 8.4–10.5)
Chloride: 102 meq/L (ref 96–112)
Creatinine, Ser: 0.78 mg/dL (ref 0.40–1.20)
GFR: 84.21 mL/min (ref >60.00)
Glucose, Bld: 118 mg/dL — ABNORMAL HIGH (ref 70–99)
Potassium: 4.3 meq/L (ref 3.5–5.1)
Sodium: 140 meq/L (ref 135–145)
Total Bilirubin: 0.7 mg/dL (ref 0.2–1.2)
Total Protein: 7.1 g/dL (ref 6.0–8.3)

## 2024-07-29 LAB — LIPID PANEL
Cholesterol: 89 mg/dL (ref 0–200)
HDL: 33.2 mg/dL — ABNORMAL LOW (ref >39.00)
LDL Cholesterol: 34 mg/dL (ref 0–99)
NonHDL: 55.48
Total CHOL/HDL Ratio: 3
Triglycerides: 108 mg/dL (ref 0.0–149.0)
VLDL: 21.6 mg/dL (ref 0.0–40.0)

## 2024-07-29 LAB — HEMOGLOBIN A1C: Hgb A1c MFr Bld: 6.9 % — ABNORMAL HIGH (ref 4.6–6.5)

## 2024-07-29 NOTE — Progress Notes (Addendum)
 Subjective:    Patient ID: Deborah Henderson, female    DOB: Jul 03, 1966, 58 y.o.   MRN: [id number]  HPI   Pt in for wellness exam.  Pt has exercising walking in park 4 miles twice a week. Pt states she has been eating healthy and watching portions since diabetic. No smoker. No alcohol.     Up to date on mammogram and pap smear.   Pt has not optometrist recently. Asking her to get diabetic eye exam and send report. For diabetes pt on metformin  1000 mg twice daily. Also on glucotrol  10 mg daily. Pt has been eating better overall  but admits on recent vacation did not eat strictly.       Review of Systems  Constitutional:  Negative for chills, fatigue and fever.  Respiratory:  Negative for cough, chest tightness, wheezing and stridor.   Cardiovascular:  Negative for chest pain and palpitations.  Gastrointestinal:  Negative for abdominal pain, blood in stool, diarrhea, nausea and vomiting.  Endocrine: Negative for polydipsia, polyphagia and polyuria.  Musculoskeletal:  Negative for back pain.  Skin:  Negative for rash.  Neurological:  Negative for dizziness, seizures, weakness and headaches.  Hematological:  Negative for adenopathy.  Psychiatric/Behavioral:  Negative for suicidal ideas. The patient is not nervous/anxious.     Past Medical History:  Diagnosis Date   Diabetes mellitus (HCC)    type 2   Diverticulosis    Hyperlipidemia    Hypertension    Hypothyroidism    Obesity    Peripheral vascular disease    Sleep apnea    Currently on CPAP machine     Social History   Socioeconomic History   Marital status: Married    Spouse name: Not on file   Number of children: 2   Years of education: Not on file   Highest education level: 12th grade  Occupational History   Occupation: Housewife   Tobacco Use   Smoking status: Former    Current packs/day: 0.00    Types: Cigarettes    Quit date: 11/09/1989    Years since quitting: 34.7   Smokeless tobacco: Never    Tobacco comments:    Quit >20 years ago  Vaping Use   Vaping status: Never Used  Substance and Sexual Activity   Alcohol use: Not Currently   Drug use: No   Sexual activity: Yes    Partners: Male    Birth control/protection: Post-menopausal  Other Topics Concern   Not on file  Social History Narrative   Not on file   Social Drivers of Health   Financial Resource Strain: Medium Risk (07/26/2024)   Overall Financial Resource Strain (CARDIA)    Difficulty of Paying Living Expenses: Somewhat hard  Food Insecurity: No Food Insecurity (07/26/2024)   Hunger Vital Sign    Worried About Running Out of Food in the Last Year: Never true    Ran Out of Food in the Last Year: Never true  Transportation Needs: No Transportation Needs (07/26/2024)   PRAPARE - Administrator, Civil Service (Medical): No    Lack of Transportation (Non-Medical): No  Physical Activity: Insufficiently Active (07/26/2024)   Exercise Vital Sign    Days of Exercise per Week: 2 days    Minutes of Exercise per Session: 30 min  Stress: No Stress Concern Present (07/26/2024)   [employer] of Occupational Health - Occupational Stress Questionnaire    Feeling of Stress: Not at all  Social Connections: Unknown (07/26/2024)  Social Advertising account executive    Frequency of Communication with Friends and Family: Twice a week    Frequency of Social Gatherings with Friends and Family: Not on file    Attends Religious Services: More than 4 times per year    Active Member of Clubs or Organizations: Yes    Attends Banker Meetings: More than 4 times per year    Marital Status: Married  Catering manager Violence: Unknown (01/26/2022)   Received from [facility]   HITS    Physically Hurt: Not on file    Insult or Talk Down To: Not on file    Threaten Physical Harm: Not on file    Scream or Curse: Not on file    Past Surgical History:  Procedure Laterality Date   CESAREAN SECTION     x2    CHOLECYSTECTOMY     CHOLECYSTECTOMY, [place]AROSCOPIC  [place]02[place]   COLONOSCOPY  02/04/2015   due 2021 per patient    ECTOPIC PREGNANCY SURGERY     ESOPHAGOGASTRODUODENOSCOPY  2015   OVARIAN CYST SURGERY  1988   Left   ROBOTIC ASSISTED BILATERAL SALPINGO OOPHERECTOMY N/A 05/21/2017   Procedure: XI ROBOTIC ASSIST  LEFT SALPINGO OOPHORECTOMY, LYSIS OF ADHESIONS;  Surgeon: Dodie Shadow, MD;  Location: [facility];  Service: Gynecology;  Laterality: N/A;    Family History  Problem Relation Age of Onset   Colon cancer Father    Cancer Father        colon   Diabetes Father    Prostate cancer Father    Cancer Brother        bone    Diabetes Brother    Diabetes Brother    Esophageal cancer Neg Hx    Stomach cancer Neg Hx    Rectal cancer Neg Hx    Colon polyps Neg Hx     Allergies  Allergen Reactions   Asa [Aspirin] Anaphylaxis and Rash    Current Outpatient Medications on File Prior to Visit  Medication Sig Dispense Refill   glipiZIDE  (GLUCOTROL ) 10 MG tablet Take 1 tablet (10 mg total) by mouth 2 (two) times daily before a meal. 180 tablet 3   levothyroxine  (SYNTHROID ) 25 MCG tablet Take 1 tablet (25 mcg total) by mouth daily before breakfast. 30 tablet 11   losartan -hydrochlorothiazide  (HYZAAR ) 50-12.5 MG tablet TAKE 1 TABLET BY MOUTH DAILY. 90 tablet 3   metFORMIN  (GLUCOPHAGE ) 1000 MG tablet Take 1 tablet (1,000 mg total) by mouth 2 (two) times daily with a meal. 180 tablet 0   Multiple Vitamin (MULTIVITAMIN PO) Take by mouth.     polyethylene glycol powder (GLYCOLAX /MIRALAX ) 17 GM/SCOOP powder Take 17 g by mouth 2 (two) times daily as needed. (Patient taking differently: Take 17 g by mouth as needed.) 3350 g 1   rosuvastatin  (CRESTOR ) 10 MG tablet TAKE 1 TABLET BY MOUTH DAILY 90 tablet 3   No current facility-administered medications on file prior to visit.    BP 120/80   Pulse 66   Temp 97.8 F (36.6 C) (Oral)   Resp 15   Ht 5' 4 (1.626 m)   Wt 200 lb 3.2  oz (90.8 kg)   SpO2 98%   BMI 34.36 kg/m        Objective:   Physical Exam  General Mental Status- Alert. General Appearance- Not in acute distress.   Skin General: Color- Normal Color. Moisture- Normal Moisture.  Neck Carotid Arteries- Normal color. Moisture- Normal Moisture.  No carotid bruits. No JVD.  Chest and Lung Exam Auscultation: Breath Sounds:-CTA  Cardiovascular Auscultation:Rythm- RRR Murmurs & Other Heart Sounds:Auscultation of the heart reveals- No Murmurs.  Abdomen Inspection:-Inspeection Normal. Palpation/Percussion:Note:No mass. Palpation and Percussion of the abdomen reveal- Non Tender, Non Distended + BS, no rebound or guarding.   Neurologic Cranial Nerve exam:- CN III-XII intact(No nystagmus), symmetric smile. Strength:- 5/5 equal and symmetric strength both upper and lower extremities.   Lower ext- see quality metrics.    Assessment & Plan:  For you wellness exam today I have ordered cbc, cmp, hep b immunity status test and  lipid panel.  Flu vaccine. Pcv 20 as nurse visit later since out of vaccine today. Advised on shingrix vaccine. Can get later if you decide.  Up to date on pap and mammogram.  When get diabetic eye exam please have that office fax results to update care gap.  Recommend exercise and healthy diet.  We will let you know lab results as they come in.  Follow up date appointment will be determined after lab review.    For diabetes continue metformin  and glipizide . Will get A1c today and see if changes need to be made.    [id number] charge as did address diabetes since due for A1c. Also updated foot exam

## 2024-07-29 NOTE — Addendum Note (Signed)
 Addended by: GERARD CHUCKIE SAILOR on: 07/29/2024 10:10 AM   Modules accepted: Orders

## 2024-07-29 NOTE — Patient Instructions (Addendum)
 For you wellness exam today I have ordered cbc, cmp, hep b immunity status test and  lipid panel.  Flu vaccine. Pcv 20 as nurse visit later since out of vaccine today. Advised on shingrix vaccine. Can get later if you decide.  Up to date on pap and mammogram.  When get diabetic eye exam please have that office fax results to update care gap.  Recommend exercise and healthy diet.  We will let you know lab results as they come in.  Follow up date appointment will be determined after lab review.    For diabetes continue metformin  and glipizide . Will get A1c today and see if changes need to be made.   Preventive Care 81-40 Years Old, Female Preventive care refers to lifestyle choices and visits with your health care provider that can promote health and wellness. Preventive care visits are also called wellness exams. What can I expect for my preventive care visit? Counseling Your health care provider may ask you questions about your: Medical history, including: Past medical problems. Family medical history. Pregnancy history. Current health, including: Menstrual cycle. Method of birth control. Emotional well-being. Home life and relationship well-being. Sexual activity and sexual health. Lifestyle, including: Alcohol, nicotine or tobacco, and drug use. Access to firearms. Diet, exercise, and sleep habits. Work and work Astron[employer]. Sunscreen use. Safety issues such as seatbelt and bike helmet use. Physical exam Your health care provider will check your: Height and weight. These may be used to calculate your BMI (body mass index). BMI is a measurement that tells if you are at a healthy weight. Waist circumference. This measures the distance around your waistline. This measurement also tells if you are at a healthy weight and may help predict your risk of certain diseases, such as type 2 diabetes and high blood pressure. Heart rate and blood pressure. Body temperature. Skin for  abnormal spots. What immunizations do I need?  Vaccines are usually given at various ages, according to a schedule. Your health care provider will recommend vaccines for you based on your age, medical history, and lifestyle or other factors, such as travel or where you work. What tests do I need? Screening Your health care provider may recommend screening tests for certain conditions. This may include: Lipid and cholesterol levels. Diabetes screening. This is done by checking your blood sugar (glucose) after you have not eaten for a while (fasting). Pelvic exam and Pap test. Hepatitis B test. Hepatitis C test. HIV (human immunodeficiency virus) test. STI (sexually transmitted infection) testing, if you are at risk. Lung cancer screening. Colorectal cancer screening. Mammogram. Talk with your health care provider about when you should start having regular mammograms. This may depend on whether you have a family history of breast cancer. BRCA-related cancer screening. This may be done if you have a family history of breast, ovarian, tubal, or peritoneal cancers. Bone density scan. This is done to screen for osteoporosis. Talk with your health care provider about your test results, treatment options, and if necessary, the need for more tests. Follow these instructions at home: Eating and drinking  Eat a diet that includes fresh fruits and vegetables, whole grains, lean protein, and low-fat dairy products. Take vitamin and mineral supplements as recommended by your health care provider. Do not drink alcohol if: Your health care provider tells you not to drink. You are pregnant, may be pregnant, or are planning to become pregnant. If you drink alcohol: Limit how much you have to 0-1 drink a day. Know how  much alcohol is in your drink. In the [place] one drink equals one 12 oz bottle of beer (355 mL), one 5 oz glass of wine (148 mL), or one 1 oz glass of hard liquor (44 mL). Lifestyle Brush  your teeth every morning and night with fluoride toothpaste. Floss one time each day. Exercise for at least 30 minutes 5 or more days each week. Do not use any products that contain nicotine or tobacco. These products include cigarettes, chewing tobacco, and vaping devices, such as e-cigarettes. If you need help quitting, ask your health care provider. Do not use drugs. If you are sexually active, practice safe sex. Use a condom or other form of protection to prevent STIs. If you do not wish to become pregnant, use a form of birth control. If you plan to become pregnant, see your health care provider for a prepregnancy visit. Take aspirin only as told by your health care provider. Make sure that you understand how much to take and what form to take. Work with your health care provider to find out whether it is safe and beneficial for you to take aspirin daily. Find healthy ways to manage stress, such as: Meditation, yoga, or listening to music. Journaling. Talking to a trusted person. Spending time with friends and family. Minimize exposure to UV radiation to reduce your risk of skin cancer. Safety Always wear your seat belt while driving or riding in a vehicle. Do not drive: If you have been drinking alcohol. Do not ride with someone who has been drinking. When you are tired or distracted. While texting. If you have been using any mind-altering substances or drugs. Wear a helmet and other protective equipment during sports activities. If you have firearms in your house, make sure you follow all gun safety procedures. Seek help if you have been physically or sexually abused. What's next? Visit your health care provider once a year for an annual wellness visit. Ask your health care provider how often you should have your eyes and teeth checked. Stay up to date on all vaccines. This information is not intended to replace advice given to you by your health care provider. Make sure you discuss  any questions you have with your health care provider. Document Revised: 04/05/2021 Document Reviewed: 04/05/2021 [employer]vier Patient Education  2024 [employer].

## 2024-07-30 LAB — HEPATITIS B SURFACE ANTIBODY, QUANTITATIVE: Hep B S AB Quant (Post): 23 m[IU]/mL (ref >=10)

## 2024-08-10 ENCOUNTER — Other Ambulatory Visit: Payer: Self-pay | Admitting: Medical

## 2024-08-10 ENCOUNTER — Other Ambulatory Visit (HOSPITAL_BASED_OUTPATIENT_CLINIC_OR_DEPARTMENT_OTHER): Payer: Self-pay

## 2024-08-10 MED ORDER — METFORMIN HCL 1000 MG PO TABS
1000.0000 mg | ORAL_TABLET | Freq: Two times a day (BID) | ORAL | 1 refills | Status: AC
  Filled 2024-08-10: qty 180, 90d supply, fill #0
  Filled 2024-11-06: qty 180, 90d supply, fill #1

## 2024-08-16 ENCOUNTER — Encounter: Payer: Self-pay | Admitting: Medical

## 2024-08-17 ENCOUNTER — Other Ambulatory Visit: Payer: Self-pay

## 2024-08-17 MED ORDER — ROSUVASTATIN CALCIUM 10 MG PO TABS: 10.0000 mg | ORAL_TABLET | Freq: Every day | ORAL | 3 refills | Status: AC

## 2024-08-25 ENCOUNTER — Other Ambulatory Visit (HOSPITAL_BASED_OUTPATIENT_CLINIC_OR_DEPARTMENT_OTHER): Payer: Self-pay

## 2024-08-25 ENCOUNTER — Other Ambulatory Visit: Payer: Self-pay | Admitting: Medical

## 2024-08-25 MED ORDER — GLIPIZIDE 10 MG PO TABS
10.0000 mg | ORAL_TABLET | Freq: Two times a day (BID) | ORAL | 1 refills | Status: AC
  Filled 2024-08-25: qty 180, 90d supply, fill #0
  Filled 2024-11-25: qty 180, 90d supply, fill #1

## 2024-09-28 ENCOUNTER — Other Ambulatory Visit: Payer: Self-pay

## 2024-09-28 ENCOUNTER — Other Ambulatory Visit (HOSPITAL_BASED_OUTPATIENT_CLINIC_OR_DEPARTMENT_OTHER): Payer: Self-pay

## 2024-11-06 ENCOUNTER — Other Ambulatory Visit (HOSPITAL_BASED_OUTPATIENT_CLINIC_OR_DEPARTMENT_OTHER): Payer: Self-pay
